# Patient Record
Sex: Male | Born: 1970
Health system: Southern US, Community
[De-identification: ages and names within clinical notes are randomized; demographics above are authoritative.]

## PROBLEM LIST (undated history)

## (undated) DIAGNOSIS — E785 Hyperlipidemia, unspecified: Secondary | ICD-10-CM

## (undated) DIAGNOSIS — J309 Allergic rhinitis, unspecified: Secondary | ICD-10-CM

## (undated) DIAGNOSIS — G4730 Sleep apnea, unspecified: Principal | ICD-10-CM

## (undated) DIAGNOSIS — N498 Inflammatory disorders of other specified male genital organs: Secondary | ICD-10-CM

## (undated) DIAGNOSIS — M79609 Pain in unspecified limb: Secondary | ICD-10-CM

## (undated) DIAGNOSIS — M519 Unspecified thoracic, thoracolumbar and lumbosacral intervertebral disc disorder: Secondary | ICD-10-CM

## (undated) DIAGNOSIS — E119 Type 2 diabetes mellitus without complications: Secondary | ICD-10-CM

## (undated) DIAGNOSIS — G4710 Hypersomnia, unspecified: Secondary | ICD-10-CM

## (undated) HISTORY — DX: Sleep apnea, unspecified: G47.30

## (undated) HISTORY — DX: Hypersomnia, unspecified: G47.10

## (undated) HISTORY — DX: Allergic rhinitis, unspecified: J30.9

## (undated) HISTORY — DX: Inflammatory disorders of other specified male genital organs: N49.8

## (undated) HISTORY — DX: Unspecified thoracic, thoracolumbar and lumbosacral intervertebral disc disorder: M51.9

## (undated) HISTORY — DX: Hyperlipidemia, unspecified: E78.5

## (undated) HISTORY — DX: Type 2 diabetes mellitus without complications: E11.9

## (undated) HISTORY — DX: Pain in unspecified limb: M79.609

---

## 1999-04-13 ENCOUNTER — Encounter: Payer: Self-pay | Admitting: Neurology

## 1999-04-13 ENCOUNTER — Emergency Department (HOSPITAL_COMMUNITY): Admission: EM | Admit: 1999-04-13 | Discharge: 1999-04-13 | Payer: Self-pay | Admitting: Cardiology

## 2000-11-15 ENCOUNTER — Encounter: Admission: RE | Admit: 2000-11-15 | Discharge: 2000-11-15 | Payer: Self-pay | Admitting: Endocrinology

## 2000-11-15 ENCOUNTER — Encounter: Payer: Self-pay | Admitting: Endocrinology

## 2006-07-14 ENCOUNTER — Emergency Department (HOSPITAL_COMMUNITY): Admission: EM | Admit: 2006-07-14 | Discharge: 2006-07-14 | Payer: Self-pay | Admitting: Emergency Medicine

## 2008-07-20 ENCOUNTER — Emergency Department (HOSPITAL_COMMUNITY): Admission: EM | Admit: 2008-07-20 | Discharge: 2008-07-20 | Payer: Self-pay | Admitting: Emergency Medicine

## 2008-11-11 ENCOUNTER — Emergency Department (HOSPITAL_COMMUNITY): Admission: EM | Admit: 2008-11-11 | Discharge: 2008-11-11 | Payer: Self-pay | Admitting: Emergency Medicine

## 2008-11-30 ENCOUNTER — Ambulatory Visit: Payer: Self-pay | Admitting: Internal Medicine

## 2008-11-30 DIAGNOSIS — N498 Inflammatory disorders of other specified male genital organs: Secondary | ICD-10-CM

## 2008-11-30 DIAGNOSIS — G4710 Hypersomnia, unspecified: Secondary | ICD-10-CM

## 2008-11-30 HISTORY — DX: Inflammatory disorders of other specified male genital organs: N49.8

## 2008-11-30 HISTORY — DX: Hypersomnia, unspecified: G47.10

## 2008-11-30 LAB — CONVERTED CEMR LAB
ALT: 42 units/L (ref 0–53)
AST: 29 units/L (ref 0–37)
Albumin: 4.2 g/dL (ref 3.5–5.2)
Alkaline Phosphatase: 60 units/L (ref 39–117)
BUN: 15 mg/dL (ref 6–23)
Basophils Absolute: 0 10*3/uL (ref 0.0–0.1)
Basophils Relative: 0 % (ref 0.0–3.0)
Bilirubin Urine: NEGATIVE
Bilirubin, Direct: 0.2 mg/dL (ref 0.0–0.3)
CO2: 30 meq/L (ref 19–32)
Calcium: 9.4 mg/dL (ref 8.4–10.5)
Chloride: 102 meq/L (ref 96–112)
Cholesterol: 172 mg/dL (ref 0–200)
Creatinine, Ser: 1.3 mg/dL (ref 0.4–1.5)
Direct LDL: 94.9 mg/dL
Eosinophils Absolute: 0.3 10*3/uL (ref 0.0–0.7)
Eosinophils Relative: 2.6 % (ref 0.0–5.0)
GFR calc non Af Amer: 65.52 mL/min (ref >60)
Glucose, Bld: 110 mg/dL — ABNORMAL HIGH (ref 70–99)
HCT: 46.3 % (ref 39.0–52.0)
HDL: 36.9 mg/dL — ABNORMAL LOW (ref >39.00)
Hemoglobin: 16.3 g/dL (ref 13.0–17.0)
Ketones, ur: NEGATIVE mg/dL
Leukocytes, UA: NEGATIVE
Lymphocytes Relative: 29 % (ref 12.0–46.0)
Lymphs Abs: 2.9 10*3/uL (ref 0.7–4.0)
MCHC: 35.2 g/dL (ref 30.0–36.0)
MCV: 90.8 fL (ref 78.0–100.0)
Monocytes Absolute: 0.9 10*3/uL (ref 0.1–1.0)
Monocytes Relative: 9.1 % (ref 3.0–12.0)
Neutro Abs: 6 10*3/uL (ref 1.4–7.7)
Neutrophils Relative %: 59.3 % (ref 43.0–77.0)
Nitrite: NEGATIVE
Platelets: 207 10*3/uL (ref 150.0–400.0)
Potassium: 4 meq/L (ref 3.5–5.1)
RBC: 5.1 M/uL (ref 4.22–5.81)
RDW: 13.1 % (ref 11.5–14.6)
Sodium: 141 meq/L (ref 135–145)
Specific Gravity, Urine: >=1.03 (ref 1.000–1.030)
TSH: 2.55 microintl units/mL (ref 0.35–5.50)
Total Bilirubin: 1 mg/dL (ref 0.3–1.2)
Total CHOL/HDL Ratio: 5
Total Protein, Urine: NEGATIVE mg/dL
Total Protein: 6.9 g/dL (ref 6.0–8.3)
Triglycerides: 273 mg/dL — ABNORMAL HIGH (ref 0.0–149.0)
Urine Glucose: NEGATIVE mg/dL
Urobilinogen, UA: 0.2 (ref 0.0–1.0)
VLDL: 54.6 mg/dL — ABNORMAL HIGH (ref 0.0–40.0)
WBC: 10.1 10*3/uL (ref 4.5–10.5)
pH: 5 (ref 5.0–8.0)

## 2008-12-04 ENCOUNTER — Ambulatory Visit (HOSPITAL_COMMUNITY): Admission: RE | Admit: 2008-12-04 | Discharge: 2008-12-04 | Payer: Self-pay | Admitting: Internal Medicine

## 2008-12-31 ENCOUNTER — Ambulatory Visit: Payer: Self-pay | Admitting: Pulmonary Disease

## 2008-12-31 DIAGNOSIS — J309 Allergic rhinitis, unspecified: Secondary | ICD-10-CM

## 2008-12-31 HISTORY — DX: Allergic rhinitis, unspecified: J30.9

## 2009-01-16 ENCOUNTER — Encounter: Payer: Self-pay | Admitting: Pulmonary Disease

## 2009-01-16 ENCOUNTER — Ambulatory Visit (HOSPITAL_BASED_OUTPATIENT_CLINIC_OR_DEPARTMENT_OTHER): Admission: RE | Admit: 2009-01-16 | Discharge: 2009-01-16 | Payer: Self-pay | Admitting: Pulmonary Disease

## 2009-01-18 ENCOUNTER — Ambulatory Visit: Payer: Self-pay | Admitting: Pulmonary Disease

## 2009-01-26 ENCOUNTER — Encounter: Payer: Self-pay | Admitting: Pulmonary Disease

## 2009-02-01 ENCOUNTER — Ambulatory Visit: Payer: Self-pay | Admitting: Pulmonary Disease

## 2009-02-03 DIAGNOSIS — G4730 Sleep apnea, unspecified: Secondary | ICD-10-CM

## 2009-02-03 HISTORY — DX: Sleep apnea, unspecified: G47.30

## 2009-03-08 ENCOUNTER — Ambulatory Visit: Payer: Self-pay | Admitting: Pulmonary Disease

## 2009-04-23 ENCOUNTER — Encounter: Payer: Self-pay | Admitting: Pulmonary Disease

## 2009-06-02 ENCOUNTER — Observation Stay (HOSPITAL_COMMUNITY): Admission: EM | Admit: 2009-06-02 | Discharge: 2009-06-03 | Payer: Self-pay | Admitting: Emergency Medicine

## 2009-06-02 ENCOUNTER — Emergency Department (HOSPITAL_COMMUNITY): Admission: EM | Admit: 2009-06-02 | Discharge: 2009-06-02 | Payer: Self-pay | Admitting: Family Medicine

## 2009-06-02 ENCOUNTER — Encounter: Payer: Self-pay | Admitting: Internal Medicine

## 2009-06-03 ENCOUNTER — Ambulatory Visit: Payer: Self-pay | Admitting: Internal Medicine

## 2009-06-03 DIAGNOSIS — E119 Type 2 diabetes mellitus without complications: Secondary | ICD-10-CM | POA: Insufficient documentation

## 2009-06-03 HISTORY — DX: Type 2 diabetes mellitus without complications: E11.9

## 2009-06-05 ENCOUNTER — Telehealth: Payer: Self-pay | Admitting: Internal Medicine

## 2009-06-05 LAB — CONVERTED CEMR LAB
BUN: 13 mg/dL (ref 6–23)
CO2: 27 meq/L (ref 19–32)
Calcium: 8.6 mg/dL (ref 8.4–10.5)
Chloride: 104 meq/L (ref 96–112)
Cholesterol: 189 mg/dL (ref 0–200)
Creatinine, Ser: 1.1 mg/dL (ref 0.4–1.5)
Direct LDL: 44.5 mg/dL
GFR calc non Af Amer: 79.24 mL/min (ref >60)
Glucose, Bld: 414 mg/dL — ABNORMAL HIGH (ref 70–99)
HDL: 31.5 mg/dL — ABNORMAL LOW (ref >39.00)
Hgb A1c MFr Bld: 9.4 % — ABNORMAL HIGH (ref 4.6–6.5)
Potassium: 4 meq/L (ref 3.5–5.1)
Sodium: 137 meq/L (ref 135–145)
Total CHOL/HDL Ratio: 6
Triglycerides: 1027 mg/dL — ABNORMAL HIGH (ref 0.0–149.0)
VLDL: 205.4 mg/dL — ABNORMAL HIGH (ref 0.0–40.0)

## 2009-06-07 ENCOUNTER — Telehealth: Payer: Self-pay | Admitting: Internal Medicine

## 2009-06-10 ENCOUNTER — Encounter: Payer: Self-pay | Admitting: Pulmonary Disease

## 2009-06-26 ENCOUNTER — Encounter: Admission: RE | Admit: 2009-06-26 | Discharge: 2009-08-08 | Payer: Self-pay | Admitting: Internal Medicine

## 2009-07-03 ENCOUNTER — Encounter: Payer: Self-pay | Admitting: Internal Medicine

## 2009-07-21 ENCOUNTER — Encounter: Payer: Self-pay | Admitting: Pulmonary Disease

## 2009-07-22 ENCOUNTER — Ambulatory Visit: Payer: Self-pay | Admitting: Pulmonary Disease

## 2009-07-22 ENCOUNTER — Telehealth (INDEPENDENT_AMBULATORY_CARE_PROVIDER_SITE_OTHER): Payer: Self-pay | Admitting: *Deleted

## 2009-07-23 ENCOUNTER — Ambulatory Visit: Payer: Self-pay | Admitting: Internal Medicine

## 2009-07-23 LAB — CONVERTED CEMR LAB
BUN: 13 mg/dL (ref 6–23)
CO2: 29 meq/L (ref 19–32)
Calcium: 9.1 mg/dL (ref 8.4–10.5)
Chloride: 108 meq/L (ref 96–112)
Cholesterol: 119 mg/dL (ref 0–200)
Creatinine, Ser: 1 mg/dL (ref 0.4–1.5)
GFR calc non Af Amer: 88.39 mL/min (ref >60)
Glucose, Bld: 92 mg/dL (ref 70–99)
HDL: 46.8 mg/dL (ref >39.00)
Hgb A1c MFr Bld: 7.8 % — ABNORMAL HIGH (ref 4.6–6.5)
LDL Cholesterol: 38 mg/dL (ref 0–99)
Potassium: 3.6 meq/L (ref 3.5–5.1)
Sodium: 141 meq/L (ref 135–145)
Total CHOL/HDL Ratio: 3
Triglycerides: 171 mg/dL — ABNORMAL HIGH (ref 0.0–149.0)
VLDL: 34.2 mg/dL (ref 0.0–40.0)

## 2009-08-08 ENCOUNTER — Encounter: Payer: Self-pay | Admitting: Internal Medicine

## 2009-08-09 ENCOUNTER — Encounter: Payer: Self-pay | Admitting: Internal Medicine

## 2010-01-31 ENCOUNTER — Ambulatory Visit: Payer: Self-pay | Admitting: Internal Medicine

## 2010-03-14 ENCOUNTER — Ambulatory Visit: Payer: Self-pay | Admitting: Internal Medicine

## 2010-05-16 ENCOUNTER — Encounter: Payer: Self-pay | Admitting: Internal Medicine

## 2010-05-16 ENCOUNTER — Telehealth: Payer: Self-pay | Admitting: Internal Medicine

## 2010-05-22 ENCOUNTER — Telehealth: Payer: Self-pay | Admitting: Internal Medicine

## 2010-05-25 HISTORY — PX: HERNIA REPAIR: SHX51

## 2010-06-26 NOTE — Letter (Signed)
Summary: Patient Visit & Meds Summary/[facility]  Patient Visit & Meds Summary/[facility]   Imported By: Sherian Rein 05/23/2010 09:35:14  _____________________________________________________________________  External Attachment:    Type:   Image     Comment:   External Document

## 2010-06-26 NOTE — Progress Notes (Signed)
Summary: cpap/ appt today?   Phone Note Call from Patient   Caller: Patient Call For: alva Summary of Call: pt has not been able to take his cpap in for download (due to work schedule). does he need to see dr Vassie Loll today? he is in [place] today (because of the appt w/ ra). call him asap at [phone] Initial call taken by: Tivis Ringer, CNA,  July 22, 2009 10:34 AM  Follow-up for Phone Call        Pt going to try and get machine to [facility] to have download done prior to appt time. Abigail Miyamoto RN  July 22, 2009 11:03 AM      Appended Document: cpap/ appt today?  Let him know - download reviewed , compliance remains 2-3 hrs/ night, no leak, residual events acceptable, CPAP does it's job when he uses it .   RA     Informed pt of above. jwr

## 2010-06-26 NOTE — Letter (Signed)
Summary: [facility]  [facility]   Imported By: Sherian Rein 07/09/2009 08:24:22  _____________________________________________________________________  External Attachment:    Type:   Image     Comment:   External Document
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
## 2010-06-26 NOTE — Progress Notes (Signed)
Summary: Abd discomfort  Phone Note Call from Patient Call back at Home Phone [phone]   Caller: Patient Summary of Call: pt called stating that he was bending to pick something up and felt a "pop" on his RT side below his rib cage and above his belly button. pt states that he is not having severe pain but is having soreness in that area. pt is concerned because he says during his exam last OV he was asked if he was having any abdominal pain or discomfort. please advise Initial call taken by: Margaret Pyle, CMA,  June 05, 2009 4:14 PM  Follow-up for Phone Call        very likely to be muscle strain as he describes it;  ok to follow for now, but consider OV if it persists, gets worse, or assoc with fever., n/v , blood  or other change in bowel habits Follow-up by: Corwin Levins MD,  June 05, 2009 4:19 PM  Additional Follow-up for Phone Call Additional follow up Details #1::        pt informed of above Additional Follow-up by: Margaret Pyle, CMA,  June 05, 2009 4:23 PM

## 2010-06-26 NOTE — Progress Notes (Signed)
Summary: Rx refill req  Phone Note Refill Request Message from:  Patient on May 22, 2010 8:18 AM  Refills Requested: Medication #1:  JANUVIA 100 MG TABS 1 by mouth once daily   Dosage confirmed as above?Dosage Confirmed   Supply Requested: 6 months  Method Requested: Fax to [facility] Initial call taken by: Margaret Pyle, CMA,  May 22, 2010 8:19 AM    Prescriptions: JANUVIA 100 MG TABS (SITAGLIPTIN PHOSPHATE) 1 by mouth once daily  #90 x 1   Entered by:   Margaret Pyle, CMA   Authorized by:   Corwin Levins MD   Signed by:   Margaret Pyle, CMA on 05/22/2010   Method used:   Electronically to        [facility]* (retail)       [address]       [address] Shipping/mailing       [place], [place]  [address]       Ph: [phone]       Fax: [phone]   RxID:   [id number]

## 2010-06-26 NOTE — Letter (Signed)
[facility]: [facility]   Imported By: Sherian Rein 08/15/2009 11:33:23  _____________________________________________________________________  External Attachment:    Type:   Image     Comment:   External Document
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
## 2010-06-26 NOTE — Letter (Signed)
Summary: thirsty w freq urination/[facility]  thirsty w freq urination/[facility]   Imported By: Lester [place] 06/06/2009 12:01:29  _____________________________________________________________________  External Attachment:    Type:   Image     Comment:   External Document

## 2010-06-26 NOTE — Letter (Signed)
Summary: [facility]   Imported By: Sherian Rein 10/17/2009 08:21:30  _____________________________________________________________________  External Attachment:    Type:   Image     Comment:   External Document

## 2010-06-26 NOTE — Progress Notes (Signed)
Summary: Blood Sugar readings  Phone Note Call from Patient   Summary of Call: pt called stating on the 10th his BS-295- 11th [phone] [phone]- 13th 288,300 and today so far 14th 335, 370. For dinner on the 13th pt stated he had Tomato Soup and a grilled [place], ham, and cheese on white wheat bread. And for a snack he had 1/2 bowl of chicken and dumplings. When he woke up it was 335. Initial call taken by: Josph Macho CMA,  June 07, 2009 3:18 PM  Follow-up for Phone Call        ok to increase the actos to 45mg  per day, and the metformin to 2 pills twice per day - both sent escrpt Follow-up by: Corwin Levins MD,  June 07, 2009 5:46 PM  Additional Follow-up for Phone Call Additional follow up Details #1::        pt informed Additional Follow-up by: Margaret Pyle, CMA,  June 10, 2009 8:22 AM    New/Updated Medications: GLUCOPHAGE XR 500 MG XR24H-TAB (METFORMIN HCL) 2 by mouth two times a day ACTOS 45 MG TABS (PIOGLITAZONE HCL) 1 by mouth once daily Prescriptions: GLUCOPHAGE XR 500 MG XR24H-TAB (METFORMIN HCL) 2 by mouth two times a day  #360 x 3   Entered and Authorized by:   Corwin Levins MD   Signed by:   Corwin Levins MD on 06/07/2009   Method used:   [facility]ronically to        [facility]  [address] [phone]* (retail)       [address]       [place], [place]  [address]       Ph: [phone]       Fax: [phone]   RxID:   [id number] ACTOS 45 MG TABS (PIOGLITAZONE HCL) 1 by mouth once daily  #90 x 3   Entered and Authorized by:   Corwin Levins MD   Signed by:   Corwin Levins MD on 06/07/2009   Method used:   [facility]        [facility]  [address] [phone][facility] (retail)       [address]       [place], [place]  [address]       Ph: [phone]       Fax: [phone]   RxID:   [id number]

## 2010-06-26 NOTE — Letter (Signed)
Summary: [facility] Results Follow Up Letter  [facility]  [address]   [place], [place] [address]   Phone: [phone]  Fax: [phone]    06/10/2009 MRN: [id number]  Lankford Petrovic [address] [place], [place]  [address]  Dear Mr. Fessel,  We have received the results from your recent tests and have been unable to contact you.  Please call our office at [phone] so that [facility] or his nurse may review the results with you.    Thank you,  [facility]  Appended Document: [place] [facility] Results Follow Up Letter see append from 03/08/2009. jwr
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
## 2010-06-26 NOTE — Letter (Signed)
Summary: MedLink  MedLink   Imported By: Sherian Rein 05/23/2010 09:35:53  _____________________________________________________________________  External Attachment:    Type:   Image     Comment:   External Document

## 2010-06-26 NOTE — Progress Notes (Signed)
Summary: Med link RN  Phone Note Other Incoming   Caller: Med Crisoforo Oxford RN [phone] Eileen Stanford Summary of Call: RN called to inform MD that she had first visit with pt today and he qualifies for a free glucometer, strips, lancet and alcohol wipes. RN is requesting on pt's behalf RXs to [facility]. Initial call taken by: Margaret Pyle, CMA,  May 16, 2010 12:59 PM  Follow-up for Phone Call        ok - to robin to handle Follow-up by: Corwin Levins MD,  May 16, 2010 1:05 PM    New/Updated Medications: [facility] BASIC SYSTEM W/DEVICE KIT (BLOOD GLUCOSE MONITORING SUPPL) use as directed ONETOUCH LANCETS  MISC (LANCETS) use as directed ONETOUCH TEST  STRP (GLUCOSE BLOOD) use as directed BD SWAB SINGLE USE REGULAR  PADS (ALCOHOL SWABS) use as directed Prescriptions: BD SWAB SINGLE USE REGULAR  PADS (ALCOHOL SWABS) use as directed  #100 x 11   Entered by:   Zella Ball Ewing CMA (AAMA)   Authorized by:   Corwin Levins MD   Signed by:   Scharlene Gloss CMA (AAMA) on 05/16/2010   Method used:   Faxed to ...       [facility]* (retail)       [address]       [address] Shipping/mailing       [place], [place]  [address]       Ph: [phone]       Fax: [phone]   RxID:   [id number] [facility] TEST  STRP (GLUCOSE BLOOD) use as directed  #100 x 11   Entered by:   Scharlene Gloss CMA (AAMA)   Authorized by:   Corwin Levins MD   Signed by:   Scharlene Gloss CMA (AAMA) on 05/16/2010   Method used:   Faxed to ...       [facility]* (retail)       [address]       [address] Shipping/mailing       [place], [place]  [address]       Ph: [phone]       Fax: [phone]   RxID:   [id number] [facility] LANCETS  MISC (LANCETS) use as directed  #100 x 11   Entered by:   Zella Ball Ewing CMA (AAMA)   Authorized by:   Corwin Levins MD   Signed by:   Scharlene Gloss CMA (AAMA) on 05/16/2010   Method used:   Faxed to ...       [facility]* (retail)       [address]       [address] [place] [address]       [place], [place]  [address]       Ph: [phone]       Fax: [phone]   RxID:   [id number] [facility] BASIC SYSTEM W/DEVICE KIT (BLOOD GLUCOSE MONITORING SUPPL) use as directed  #1 x 0   Entered by:   Scharlene Gloss CMA (AAMA)   Authorized by:   Corwin Levins MD   Signed by:   Scharlene Gloss CMA (AAMA) on 05/16/2010   Method used:   Faxed to ...       [facility]* (retail)       [address]       [address] Shipping/mailing       [place],  [place]  [address]       Ph: [phone]       Fax: [phone]   RxID:   [id number]

## 2010-08-01 ENCOUNTER — Other Ambulatory Visit: Payer: Self-pay | Admitting: Internal Medicine

## 2010-08-01 ENCOUNTER — Other Ambulatory Visit: Payer: Self-pay

## 2010-08-01 ENCOUNTER — Encounter (INDEPENDENT_AMBULATORY_CARE_PROVIDER_SITE_OTHER): Payer: Self-pay | Admitting: *Deleted

## 2010-08-01 DIAGNOSIS — E785 Hyperlipidemia, unspecified: Secondary | ICD-10-CM

## 2010-08-01 DIAGNOSIS — E119 Type 2 diabetes mellitus without complications: Secondary | ICD-10-CM

## 2010-08-01 LAB — BASIC METABOLIC PANEL
Chloride: 104 mEq/L (ref 96–112)
Potassium: 4.6 mEq/L (ref 3.5–5.1)
Sodium: 140 mEq/L (ref 135–145)

## 2010-08-01 LAB — LIPID PANEL
Cholesterol: 139 mg/dL (ref 0–200)
Total CHOL/HDL Ratio: 4
Triglycerides: 204 mg/dL — ABNORMAL HIGH (ref 0.0–149.0)
VLDL: 40.8 mg/dL — ABNORMAL HIGH (ref 0.0–40.0)

## 2010-08-08 ENCOUNTER — Ambulatory Visit (INDEPENDENT_AMBULATORY_CARE_PROVIDER_SITE_OTHER): Payer: Commercial Managed Care - PPO | Admitting: Internal Medicine

## 2010-08-08 ENCOUNTER — Encounter: Payer: Self-pay | Admitting: Internal Medicine

## 2010-08-08 DIAGNOSIS — M79609 Pain in unspecified limb: Secondary | ICD-10-CM | POA: Insufficient documentation

## 2010-08-08 DIAGNOSIS — E119 Type 2 diabetes mellitus without complications: Secondary | ICD-10-CM

## 2010-08-08 DIAGNOSIS — J309 Allergic rhinitis, unspecified: Secondary | ICD-10-CM

## 2010-08-08 HISTORY — DX: Pain in unspecified limb: M79.609

## 2010-08-10 LAB — URINALYSIS, ROUTINE W REFLEX MICROSCOPIC
Bilirubin Urine: NEGATIVE
Glucose, UA: >1000 mg/dL — AB
Hgb urine dipstick: NEGATIVE
Ketones, ur: 15 mg/dL — AB
Leukocytes, UA: NEGATIVE
Nitrite: NEGATIVE
Protein, ur: NEGATIVE mg/dL
Specific Gravity, Urine: 1.041 — ABNORMAL HIGH (ref 1.005–1.030)
Urobilinogen, UA: 0.2 mg/dL (ref 0.0–1.0)
pH: 5.5 (ref 5.0–8.0)

## 2010-08-10 LAB — GLUCOSE, CAPILLARY
Glucose-Capillary: 191 mg/dL — ABNORMAL HIGH (ref 70–99)
Glucose-Capillary: 242 mg/dL — ABNORMAL HIGH (ref 70–99)
Glucose-Capillary: 269 mg/dL — ABNORMAL HIGH (ref 70–99)
Glucose-Capillary: 274 mg/dL — ABNORMAL HIGH (ref 70–99)
Glucose-Capillary: 290 mg/dL — ABNORMAL HIGH (ref 70–99)
Glucose-Capillary: 337 mg/dL — ABNORMAL HIGH (ref 70–99)
Glucose-Capillary: 378 mg/dL — ABNORMAL HIGH (ref 70–99)
Glucose-Capillary: 452 mg/dL — ABNORMAL HIGH (ref 70–99)
Glucose-Capillary: 497 mg/dL — ABNORMAL HIGH (ref 70–99)
Glucose-Capillary: 578 mg/dL (ref 70–99)

## 2010-08-10 LAB — BASIC METABOLIC PANEL
Calcium: 10 mg/dL (ref 8.4–10.5)
GFR calc Af Amer: >60 mL/min (ref >60)
GFR calc non Af Amer: >60 mL/min (ref >60)
Glucose, Bld: 600 mg/dL (ref 70–99)
Potassium: 4.7 mEq/L (ref 3.5–5.1)
Sodium: 129 mEq/L — ABNORMAL LOW (ref 135–145)

## 2010-08-10 LAB — BASIC METABOLIC PANEL WITH GFR
BUN: 17 mg/dL (ref 6–23)
CO2: 23 meq/L (ref 19–32)
Chloride: 92 meq/L — ABNORMAL LOW (ref 96–112)
Creatinine, Ser: 1.27 mg/dL (ref 0.4–1.5)

## 2010-08-10 LAB — POCT I-STAT, CHEM 8
BUN: 20 mg/dL (ref 6–23)
Calcium, Ion: 1.16 mmol/L (ref 1.12–1.32)
Chloride: 99 mEq/L (ref 96–112)
Creatinine, Ser: 0.8 mg/dL (ref 0.4–1.5)
Sodium: 132 mEq/L — ABNORMAL LOW (ref 135–145)
TCO2: 29 mmol/L (ref 0–100)

## 2010-08-10 LAB — POCT URINALYSIS DIP (DEVICE)
Bilirubin Urine: NEGATIVE
Protein, ur: NEGATIVE mg/dL
Specific Gravity, Urine: <=1.005 (ref 1.005–1.030)
pH: 5.5 (ref 5.0–8.0)

## 2010-08-10 LAB — URINE MICROSCOPIC-ADD ON

## 2010-08-10 LAB — KETONES, QUALITATIVE: Acetone, Bld: NEGATIVE
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
## 2010-09-25 ENCOUNTER — Inpatient Hospital Stay (INDEPENDENT_AMBULATORY_CARE_PROVIDER_SITE_OTHER)
Admission: RE | Admit: 2010-09-25 | Discharge: 2010-09-25 | Disposition: A | Discharge status: Home / Self Care | Payer: 59 | Source: Ambulatory Visit | Attending: Family Medicine | Admitting: Family Medicine

## 2010-09-25 DIAGNOSIS — J029 Acute pharyngitis, unspecified: Secondary | ICD-10-CM

## 2010-10-07 NOTE — Procedures (Signed)
NAMESANDER, REMEDIOS                 ACCOUNT NO.:  [id number]   MEDICAL RECORD NO.:  [id number]          PATIENT TYPE:  OUT   LOCATION:  SLEEP CENTER                 FACILITY:  [facility]   PHYSICIAN:  Oretha Milch, MD      DATE OF BIRTH:  06-06-1970   DATE OF STUDY:  01/16/2009                            NOCTURNAL POLYSOMNOGRAM   REFERRING PHYSICIAN:   INDICATION FOR STUDY:  Excessive daytime sleepiness, loud snoring and  nonrefreshing sleep in this 40 year old gentleman.  At the time of the  study, he weighed 260 pounds with a height of 5 feet 11 inches and a BMI  of 36.  Neck size 17.5 inches.   EPWORTH SLEEPINESS SCORE:  Epworth sleepiness score was 11.   This intervention polysomnogram was performed with a sleep technologist  in attendance.  EEG, EOG, EMG, EKG, and respiratory parameters were  recorded.  Sleep stages, arousals, limb movements, and respiratory data  were scored according to criteria laid out by the American Academy of  Sleep Medicine.   MEDICATIONS:   SLEEP ARCHITECTURE:  Lights out was at 9:45 p.m., lights on was at 5:00  a.m.  CPAP was initiated at 12:42 a.m.  During the diagnostic portion,  total sleep time was 134.5 minutes with a sleep period time of 148  minutes and a sleep maintenance efficiency of 84%.  Sleep latency was 29  minutes and latency to REM sleep was 121 minutes.  Sleep stages as a  percentage of total sleep time was N1 4%, N2 83%, REM sleep 13% (18  minutes), supine sleep accounted for 15.5 minutes.  During the titration  portion, REM was seen in too large periods for 88.5 minutes.  No supine  REM sleep was noted.   RESPIRATORY DATA:  During the diagnostic portion, there were a total of  6 obstructive apnea, 0 central apnea, 0 mixed apneas and 102 hypopneas  with an apnea/hypopnea index of 48 events per hour.  The REM AHI was 67  events per hour, 75 respiratory effort-related arousals (RERAs) were  noted with an RDI of 81.6 events per hour.   Due to this degree of  respiratory disturbance, CPAP was initiated at 5 cm with a medium full-  face mask and titrated to a final level of 11 cm.  At a level of 8 cm  for 89.5 minutes of sleep including 41.5 minutes of REM sleep, 14  obstructive apneas, 3 central apneas and 3 hypopneas were noted with an  AHI of 13.4 and the lowest desaturation of 87%.  At a final level of 11  cm for 110 minutes including 47 minutes of REM sleep, 1 obstructive  apnea and 4 hypopneas were noted with an AHI of 2.7 events per hour and  a low desaturation of 90%.  This appears to be the optimal level used  during the study.   AROUSAL DATA:  During the diagnostic portion, the arousal index was 78  events per hour.  At the final level of CPAP, 51 arousals were noted  with an arousal index of about 27 events per hour.   OXYGEN DATA:  The  lowest desaturation during the diagnostic portion was  80%.  With the CPAP of 11 cm, the lowest desaturation was 90%.   CARDIAC DATA:  During the diagnostic portion, the low heart rate was 52  beats per minute, no arrhythmias were noted.   MOVEMENT-PARASOMNIA:  During the diagnostic portion, the PLM index was  8.5 events per hour.  The PML arousal index was 1.3 events per hour.  PLM seemed to worsen after CPAP initiation with a PML arousal index of  16 events per hour.  This needs to be clinically correlated on CPAP  therapy.   DISCUSSION:  Medium Mirage Quattro mask was used.  No supine REM sleep  was noted.  CPAP of 11 cm seems to be adequate to control the events.  REM rebound was noted in the second half of the night.   IMPRESSIONS-RECOMMENDATIONS:  1. Severe obstructive sleep apnea with predominant hypopneas causing      sleep fragmentation and oxygen desaturation.  2. This was corrected by a CPAP of 11 cm with a medium Mirage full-      face mask.  3. Periodic limb movements seem to worsen after CPAP initiation and      quite a few of these were associated with  arousals.  4. No evidence of cardiac arrhythmias or behavioral disturbance during      sleep.   RECOMMENDATIONS:  1. CPAP should be initiated with a goal of 11 cm with a medium full-      face mask.  2. He should be monitored for the compliance at this level, weight      loss should be encouraged.  He should be asked to avoid medications      with sedative side effects.  He should be cautioned against driving      when sleepy.  3. Please correlate clinically for limb movements with CPAP therapy.      Oretha Milch, MD  Electronically Signed     RVA/MEDQ  D:  01/18/2009 12:08:25  T:  01/18/2009 14:29:03  Job:  [id number]

## 2010-10-10 NOTE — H&P (Signed)
[place]  Patient:    [name]                         MRN: [id number] Adm. Date:  [id number] Attending:  Glean Hess D                         History and Physical  CHIEF COMPLAINT:  Left facial weakness.  HISTORY OF PRESENT ILLNESS:  The patient is a 40 year old man who refers two weeks ago he felt like something popped in the side of his neck.  This was followed by a constant daily dulltype headache localized over the frontoparietal occipital area, more on the left than the right.  He did have some nausea with it, no photophobia, no vomiting.  Yesterday afternoon he noticed facial weakness on the left side of his face.  This morning he noticed some heaviness of the left upper extremity and shoulder, as well.  He mentioned that they do not feel as usual.  He denies double vision.  No ataxia.  No vertigo.  No fever.  No trauma.  PAST MEDICAL HISTORY:  Unremarkable.  No hypertension.  No diabetes.  No coronary disease.  MEDICATIONS:  None.  ALLERGIES:  PENICILLIN.  PRIMARY CARE PHYSICIAN:  Dr. Dagoberto Ligas.  HABITS:  He denies smoking or drinking.  FAMILY HISTORY:  Noncontributory.  SOCIAL HISTORY:  He is married.  He has one child.  His occupation is a Location manager.  PHYSICAL EXAMINATION:  VITAL SIGNS:  Blood pressure 138/81, pulse 84, respirations 20, temperature 97.4.  GENERAL:  Well-developed man laying on the stretcher, in no distress.  HEENT:  The head is normocephalic, atraumatic.  Eyes, ears, nose and throat normal.  NECK:  Supple.  No lymphadenopathy.  LUNGS:  Clear bilaterally.  HEART:  Regular, rhythmic.  No murmurs.  ABDOMEN:  Soft.  Bowel sounds present.  No obvious splenomegaly.  EXTREMITIES:  Positive pulses bilaterally.  No cyanosis or edema.  NEUROLOGIC:  Awake, alert and oriented x 4.  Speech fluent.  Memory and language normal.  Cranial nerves II-XII:  Pupils equal, round, and  reactive. Extraocular cephalic movement present.  Face is asymmetric without left facial palsy involving the upper and lower face.  Tongue in the midline.  Palate elevates symmetrically. Motor examination:  Strength equal bilaterally.  Left upper extremity full strength.  There is some decrease in fine motor control, but he is right handed. DTRs +2 throughout.  Plantars downgoing.  Coordination and sensory normal.  LABORATORY DATA:  CT scan of the head shows no acute abnormalities.  IMPRESSION:  1. Left facial palsy.  2. Subacute headaches.  3. Subjective left upper extremity weakness.  PLAN AND RECOMMENDATIONS:  The diagnosis, condition, and further intervention was discussed at length with the patient.  Would like to keep the patient for a 23-hour observation status.  Would like to obtain an MRI of his brain, with contrast, and MRA of the intra- and extracranial vessels to exclude the possibility of a dissection in view of his history of neck pain, headache, and recent neurological changes.  Will further obtain general lab testing, hemogram, chemistries, urinalysis, chest x-ray, sed rate, RPR, HIV, TSH, B12 and folate.  Dr. Leslie Dales covering for Dr. Dagoberto Ligas was notified of the patients admission.  Thank you for letting me participate in the care of this patient. DD:  04/13/99 TD:  04/13/99 Job: [id number]

## 2011-02-03 DIAGNOSIS — G4730 Sleep apnea, unspecified: Secondary | ICD-10-CM

## 2011-02-07 ENCOUNTER — Other Ambulatory Visit: Payer: Self-pay | Admitting: Internal Medicine

## 2011-02-07 DIAGNOSIS — Z0000 Encounter for general adult medical examination without abnormal findings: Secondary | ICD-10-CM

## 2011-02-07 DIAGNOSIS — Z1289 Encounter for screening for malignant neoplasm of other sites: Secondary | ICD-10-CM

## 2011-02-16 ENCOUNTER — Other Ambulatory Visit: Payer: Commercial Managed Care - PPO

## 2011-02-16 ENCOUNTER — Other Ambulatory Visit: Payer: Self-pay | Admitting: Internal Medicine

## 2011-02-16 ENCOUNTER — Other Ambulatory Visit (INDEPENDENT_AMBULATORY_CARE_PROVIDER_SITE_OTHER): Payer: 59

## 2011-02-16 DIAGNOSIS — Z1289 Encounter for screening for malignant neoplasm of other sites: Secondary | ICD-10-CM

## 2011-02-16 DIAGNOSIS — Z0000 Encounter for general adult medical examination without abnormal findings: Secondary | ICD-10-CM

## 2011-02-16 DIAGNOSIS — IMO0001 Reserved for inherently not codable concepts without codable children: Secondary | ICD-10-CM

## 2011-02-17 LAB — LIPID PANEL
Cholesterol: 143 mg/dL (ref 0–200)
HDL: 36.8 mg/dL — ABNORMAL LOW (ref >39.00)
Total CHOL/HDL Ratio: 4
Triglycerides: 337 mg/dL — ABNORMAL HIGH (ref 0.0–149.0)
VLDL: 67.4 mg/dL — ABNORMAL HIGH (ref 0.0–40.0)

## 2011-02-17 LAB — URINALYSIS, ROUTINE W REFLEX MICROSCOPIC
Bilirubin Urine: NEGATIVE
Hgb urine dipstick: NEGATIVE
Ketones, ur: NEGATIVE
Leukocytes, UA: NEGATIVE
Nitrite: NEGATIVE
Specific Gravity, Urine: >=1.03 (ref 1.000–1.030)
Total Protein, Urine: NEGATIVE
Urine Glucose: NEGATIVE
Urobilinogen, UA: 0.2 (ref 0.0–1.0)
pH: 6 (ref 5.0–8.0)

## 2011-02-17 LAB — CBC WITH DIFFERENTIAL/PLATELET
Basophils Absolute: 0.2 10*3/uL — ABNORMAL HIGH (ref 0.0–0.1)
Basophils Relative: 1.6 % (ref 0.0–3.0)
Eosinophils Absolute: 0.4 10*3/uL (ref 0.0–0.7)
Eosinophils Relative: 3.6 % (ref 0.0–5.0)
HCT: 47 % (ref 39.0–52.0)
Hemoglobin: 16 g/dL (ref 13.0–17.0)
Lymphocytes Relative: 24.2 % (ref 12.0–46.0)
Lymphs Abs: 2.5 10*3/uL (ref 0.7–4.0)
MCHC: 33.9 g/dL (ref 30.0–36.0)
MCV: 91.3 fl (ref 78.0–100.0)
Monocytes Absolute: 0.6 10*3/uL (ref 0.1–1.0)
Monocytes Relative: 5.7 % (ref 3.0–12.0)
Neutro Abs: 6.6 10*3/uL (ref 1.4–7.7)
Neutrophils Relative %: 64.9 % (ref 43.0–77.0)
Platelets: 205 10*3/uL (ref 150.0–400.0)
RBC: 5.15 Mil/uL (ref 4.22–5.81)
RDW: 13.6 % (ref 11.5–14.6)
WBC: 10.2 10*3/uL (ref 4.5–10.5)

## 2011-02-17 LAB — HEMOGLOBIN A1C: Hgb A1c MFr Bld: 6.4 % (ref 4.6–6.5)

## 2011-02-17 LAB — COMPREHENSIVE METABOLIC PANEL
BUN: 18 mg/dL (ref 6–23)
CO2: 28 mEq/L (ref 19–32)
Calcium: 9.2 mg/dL (ref 8.4–10.5)
Chloride: 104 mEq/L (ref 96–112)
Creatinine, Ser: 1.4 mg/dL (ref 0.4–1.5)
GFR: 60.46 mL/min (ref >60.00)
Glucose, Bld: 176 mg/dL — ABNORMAL HIGH (ref 70–99)
Total Bilirubin: 0.6 mg/dL (ref 0.3–1.2)

## 2011-02-17 LAB — PSA: PSA: 0.66 ng/mL (ref 0.10–4.00)

## 2011-02-17 LAB — TSH: TSH: 2.38 u[IU]/mL (ref 0.35–5.50)

## 2011-02-22 ENCOUNTER — Encounter: Payer: Self-pay | Admitting: Internal Medicine

## 2011-02-22 DIAGNOSIS — Z0000 Encounter for general adult medical examination without abnormal findings: Secondary | ICD-10-CM | POA: Insufficient documentation

## 2011-02-23 ENCOUNTER — Ambulatory Visit (INDEPENDENT_AMBULATORY_CARE_PROVIDER_SITE_OTHER): Payer: 59 | Admitting: Internal Medicine

## 2011-02-23 ENCOUNTER — Encounter: Payer: Self-pay | Admitting: Internal Medicine

## 2011-02-23 VITALS — BP 104/82 | HR 75 | Temp 97.7°F | Ht 71.0 in | Wt 249.0 lb

## 2011-02-23 DIAGNOSIS — Z23 Encounter for immunization: Secondary | ICD-10-CM

## 2011-02-23 DIAGNOSIS — E119 Type 2 diabetes mellitus without complications: Secondary | ICD-10-CM

## 2011-02-23 DIAGNOSIS — Z0000 Encounter for general adult medical examination without abnormal findings: Secondary | ICD-10-CM

## 2011-02-23 NOTE — Assessment & Plan Note (Signed)

## 2011-02-23 NOTE — Patient Instructions (Addendum)
You had the flu shot today Your EKG was good today Please return in 6 mo with Lab testing done 3-5 days before

## 2011-02-23 NOTE — Progress Notes (Signed)
Subjective:    Patient ID: [name], male    DOB: 1970/09/04, 40 y.o.   MRN: [id number]  HPI Here for wellness and f/u;  Overall doing ok;  Pt denies CP, worsening SOB, DOE, wheezing, orthopnea, PND, worsening LE edema, palpitations, dizziness or syncope.  Pt denies neurological change such as new Headache, facial or extremity weakness.  Pt denies polydipsia, polyuria, or low sugar symptoms. Pt states overall good compliance with treatment and medications, good tolerability, and trying to follow lower cholesterol diet.  Pt denies worsening depressive symptoms, suicidal ideation or panic. No fever, wt loss, night sweats, loss of appetite, or other constitutional symptoms.  Pt states good ability with ADL's, low fall risk, home safety reviewed and adequate, no significant changes in hearing or vision, and occasionally active with exercise. Past Medical History  Diagnosis Date  . ALLERGIC RHINITIS 12/31/2008  . DIABETES MELLITUS, TYPE II 06/03/2009  . FOOT PAIN, RIGHT 08/08/2010  . HYPERSOMNIA 11/30/2008  . OBSTRUCTIVE SLEEP APNEA 02/03/2009  . OTHER INFLAMMATORY DISORDER MALE GENITAL ORGANS 11/30/2008   No past surgical history on file.  reports that he has never smoked. He does not have any smokeless tobacco history on file. He reports that he drinks alcohol. He reports that he does not use illicit drugs. family history includes Cancer in his other; Coronary artery disease in his father; Depression in his maternal grandmother and mother; Diabetes in his father; Heart attack in his father; and Hypertension in his father. Allergies  Allergen Reactions  . Penicillins    Current Outpatient Prescriptions on File Prior to Visit  Medication Sig Dispense Refill  . Alcohol Swabs (B-D SINGLE USE SWABS REGULAR) PADS Use as directed       . aspirin 81 MG tablet Take 81 mg by mouth daily.        . Blood Glucose Monitoring Suppl (ONE TOUCH BASIC SYSTEM) W/DEVICE KIT Use as directed       . glucose blood  (FREESTYLE LITE) test strip 1 each by Other route 2 (two) times daily. Use as instructed       . glucose blood (ONE TOUCH TEST STRIPS) test strip Use as directed       . Lancets MISC Use as directed two times daily       . metFORMIN (GLUCOPHAGE-XR) 500 MG 24 hr tablet Take 500 mg by mouth daily.        . NON FORMULARY CPAP + 11 CM, full face mask       . ONE TOUCH LANCETS MISC Use as directed       . sitaGLIPtin (JANUVIA) 100 MG tablet Take 100 mg by mouth daily.         Review of Systems Review of Systems  Constitutional: Negative for diaphoresis, activity change, appetite change and unexpected weight change.  HENT: Negative for hearing loss, ear pain, facial swelling, mouth sores and neck stiffness.   Eyes: Negative for pain, redness and visual disturbance.  Respiratory: Negative for shortness of breath and wheezing.   Cardiovascular: Negative for chest pain and palpitations.  Gastrointestinal: Negative for diarrhea, blood in stool, abdominal distention and rectal pain.  Genitourinary: Negative for hematuria, flank pain and decreased urine volume.  Musculoskeletal: Negative for myalgias and joint swelling.  Skin: Negative for color change and wound.  Neurological: Negative for syncope and numbness.  Hematological: Negative for adenopathy.  Psychiatric/Behavioral: Negative for hallucinations, self-injury, decreased concentration and agitation.     Objective:   Physical Exam BP  104/82  Pulse 75  Temp(Src) 97.7 F (36.5 C) (Oral)  Ht 5\' 11"  (1.803 m)  Wt 249 lb (112.946 kg)  BMI 34.73 kg/m2  SpO2 95% Physical Exam  VS noted Constitutional: Pt is oriented to person, place, and time. Appears well-developed and well-nourished.  HENT:  Head: Normocephalic and atraumatic.  Right Ear: External ear normal.  Left Ear: External ear normal.  Nose: Nose normal.  Mouth/Throat: Oropharynx is clear and moist.  Eyes: Conjunctivae and EOM are normal. Pupils are equal, round, and reactive to  light.  Neck: Normal range of motion. Neck supple. No JVD present. No tracheal deviation present.  Cardiovascular: Normal rate, regular rhythm, normal heart sounds and intact distal pulses.   Pulmonary/Chest: Effort normal and breath sounds normal.  Abdominal: Soft. Bowel sounds are normal. There is no tenderness.  Musculoskeletal: Normal range of motion. Exhibits no edema.  Lymphadenopathy:  Has no cervical adenopathy.  Neurological: Pt is alert and oriented to person, place, and time. Pt has normal reflexes. No cranial nerve deficit.  Skin: Skin is warm and dry. No rash noted.  Psychiatric:  Has  normal mood and affect. Behavior is normal.     Assessment & Plan:

## 2011-02-27 ENCOUNTER — Encounter: Payer: Self-pay | Admitting: Internal Medicine

## 2011-02-27 ENCOUNTER — Observation Stay (HOSPITAL_COMMUNITY)
Admission: EM | Admit: 2011-02-27 | Discharge: 2011-03-01 | Disposition: A | Discharge status: Home / Self Care | Payer: 59 | Attending: General Surgery | Admitting: General Surgery

## 2011-02-27 ENCOUNTER — Emergency Department (HOSPITAL_COMMUNITY): Payer: 59

## 2011-02-27 ENCOUNTER — Ambulatory Visit (INDEPENDENT_AMBULATORY_CARE_PROVIDER_SITE_OTHER): Payer: 59 | Admitting: Internal Medicine

## 2011-02-27 VITALS — BP 100/70 | HR 90 | Temp 98.2°F | Ht 71.0 in | Wt 249.0 lb

## 2011-02-27 DIAGNOSIS — R109 Unspecified abdominal pain: Secondary | ICD-10-CM | POA: Insufficient documentation

## 2011-02-27 DIAGNOSIS — E785 Hyperlipidemia, unspecified: Secondary | ICD-10-CM | POA: Insufficient documentation

## 2011-02-27 DIAGNOSIS — E119 Type 2 diabetes mellitus without complications: Secondary | ICD-10-CM | POA: Insufficient documentation

## 2011-02-27 DIAGNOSIS — K436 Other and unspecified ventral hernia with obstruction, without gangrene: Principal | ICD-10-CM | POA: Insufficient documentation

## 2011-02-27 DIAGNOSIS — G4733 Obstructive sleep apnea (adult) (pediatric): Secondary | ICD-10-CM | POA: Insufficient documentation

## 2011-02-27 DIAGNOSIS — K430 Incisional hernia with obstruction, without gangrene: Secondary | ICD-10-CM

## 2011-02-27 LAB — DIFFERENTIAL
Basophils Absolute: 0 10*3/uL (ref 0.0–0.1)
Basophils Relative: 0 % (ref 0–1)
Eosinophils Absolute: 0.3 10*3/uL (ref 0.0–0.7)
Eosinophils Relative: 3 % (ref 0–5)
Monocytes Absolute: 0.8 10*3/uL (ref 0.1–1.0)
Neutro Abs: 6.1 10*3/uL (ref 1.7–7.7)

## 2011-02-27 LAB — COMPREHENSIVE METABOLIC PANEL
ALT: 33 U/L (ref 0–53)
AST: 23 U/L (ref 0–37)
Albumin: 4 g/dL (ref 3.5–5.2)
Alkaline Phosphatase: 71 U/L (ref 39–117)
Calcium: 9.3 mg/dL (ref 8.4–10.5)
Potassium: 4.1 mEq/L (ref 3.5–5.1)
Sodium: 137 mEq/L (ref 135–145)
Total Protein: 7.3 g/dL (ref 6.0–8.3)

## 2011-02-27 LAB — CBC
Hemoglobin: 16 g/dL (ref 13.0–17.0)
MCHC: 34.9 g/dL (ref 30.0–36.0)
Platelets: 186 10*3/uL (ref 150–400)
RDW: 13.1 % (ref 11.5–15.5)

## 2011-02-27 MED ORDER — IOHEXOL 300 MG/ML  SOLN
100.0000 mL | Freq: Once | INTRAMUSCULAR | Status: AC | PRN
  Administered 2011-02-27: 100 mL via INTRAVENOUS

## 2011-02-27 NOTE — Patient Instructions (Signed)
Please do not ear or drink for now Please go directly to [facility] ER for evaluation by PA for [facility], and ER MD Pt received no other treatment at this facility

## 2011-02-27 NOTE — Progress Notes (Signed)
Subjective:    Patient ID: [name], male    DOB: Feb 10, 1971, 40 y.o.   MRN: [id number]  HPI  Here with c/o 4-5 days onet abd pain , mid abdomen, sharp, mod to severe especialy to palpate, better now, but still at least mod tender, feels a hard knot to the area, abd nondistended, no n/v, no radiation to the back,  Getting up from sitting is slow and painful - feels "tight", has to roll sideways to get up as simply standing up causes too much pain and pressure, no obvious constipation ,  Denies urinary symptoms such as dysuria, frequency, urgency,or hematuria.   Pt denies fever, wt loss, night sweats, loss of appetite, or other constitutional symptoms   No blood of any kind.    Does remember a severe sharp pain previously with picking a heavy box 6 mo ago. Past Medical History  Diagnosis Date  . ALLERGIC RHINITIS 12/31/2008  . DIABETES MELLITUS, TYPE II 06/03/2009  . FOOT PAIN, RIGHT 08/08/2010  . HYPERSOMNIA 11/30/2008  . OBSTRUCTIVE SLEEP APNEA 02/03/2009  . OTHER INFLAMMATORY DISORDER MALE GENITAL ORGANS 11/30/2008   No past surgical history on file.  reports that he has never smoked. He does not have any smokeless tobacco history on file. He reports that he drinks alcohol. He reports that he does not use illicit drugs. family history includes Cancer in his other; Coronary artery disease in his father; Depression in his maternal grandmother and mother; Diabetes in his father; Heart attack in his father; and Hypertension in his father. Allergies  Allergen Reactions  . Penicillins    Current Outpatient Prescriptions on File Prior to Visit  Medication Sig Dispense Refill  . Alcohol Swabs (B-D SINGLE USE SWABS REGULAR) PADS Use as directed       . aspirin 81 MG tablet Take 81 mg by mouth daily.        . Blood Glucose Monitoring Suppl (ONE TOUCH BASIC SYSTEM) W/DEVICE KIT Use as directed       . glucose blood (FREESTYLE LITE) test strip 1 each by Other route 2 (two) times daily. Use as instructed        . glucose blood (ONE TOUCH TEST STRIPS) test strip Use as directed       . Lancets MISC Use as directed two times daily       . metFORMIN (GLUCOPHAGE-XR) 500 MG 24 hr tablet Take 500 mg by mouth daily.        . NON FORMULARY CPAP + 11 CM, full face mask       . ONE TOUCH LANCETS MISC Use as directed       . sitaGLIPtin (JANUVIA) 100 MG tablet Take 100 mg by mouth daily.         Review of Systems Review of Systems  Constitutional: Negative for diaphoresis and unexpected weight change.  HENT: Negative for drooling and tinnitus.   Eyes: Negative for photophobia and visual disturbance.  Respiratory: Negative for choking and stridor.   Gastrointestinal: Negative for vomiting and blood in stool.  Genitourinary: Negative for hematuria and decreased urine volume.     Objective:   Physical Exam BP 100/70  Pulse 90  Temp(Src) 98.2 F (36.8 C) (Oral)  Ht 5\' 11"  (1.803 m)  Wt 249 lb (112.946 kg)  BMI 34.73 kg/m2  SpO2 95% Physical Exam  VS noted Constitutional: Pt appears well-developed and well-nourished.  HENT: Head: Normocephalic.  Right Ear: External ear normal.  Left Ear: External ear  normal.  Eyes: Conjunctivae and EOM are normal. Pupils are equal, round, and reactive to light.  Neck: Normal range of motion. Neck supple.  Cardiovascular: Normal rate and regular rhythm.   Pulmonary/Chest: Effort normal and breath sounds normal.  Abd:  Soft, non-distended, + BS, with supraumbilical firm deeper to palpation "mass" marked tender, nonreducible, without overlying skin change Neurological: Pt is alert. No cranial nerve deficit.  Skin: Skin is warm. No erythema.  Psychiatric: Pt behavior is normal. Thought content normal.     Assessment & Plan:

## 2011-02-27 NOTE — Assessment & Plan Note (Signed)
Mid abd above the umbilicu, with mass I cannot reduce marked tender;  D/w Dr Jon Gills; possible incarcerated  Ventral hernia - pt to go to ER [facility] for PA for [facility] to assess;  No tx or rx done at this visit;  Pt asked to be NPO for now

## 2011-02-28 ENCOUNTER — Inpatient Hospital Stay (HOSPITAL_COMMUNITY): Payer: 59

## 2011-02-28 LAB — GLUCOSE, CAPILLARY
Glucose-Capillary: 127 mg/dL — ABNORMAL HIGH (ref 70–99)
Glucose-Capillary: 142 mg/dL — ABNORMAL HIGH (ref 70–99)

## 2011-02-28 LAB — SURGICAL PCR SCREEN
MRSA, PCR: NEGATIVE
Staphylococcus aureus: POSITIVE — AB

## 2011-03-01 ENCOUNTER — Encounter: Payer: Self-pay | Admitting: Internal Medicine

## 2011-03-01 DIAGNOSIS — M519 Unspecified thoracic, thoracolumbar and lumbosacral intervertebral disc disorder: Secondary | ICD-10-CM

## 2011-03-01 HISTORY — DX: Unspecified thoracic, thoracolumbar and lumbosacral intervertebral disc disorder: M51.9

## 2011-03-01 LAB — GLUCOSE, CAPILLARY: Glucose-Capillary: 135 mg/dL — ABNORMAL HIGH (ref 70–99)

## 2011-03-04 ENCOUNTER — Ambulatory Visit (INDEPENDENT_AMBULATORY_CARE_PROVIDER_SITE_OTHER): Payer: Commercial Managed Care - PPO | Admitting: Surgery

## 2011-03-04 ENCOUNTER — Encounter (INDEPENDENT_AMBULATORY_CARE_PROVIDER_SITE_OTHER): Payer: Self-pay | Admitting: Surgery

## 2011-03-04 VITALS — BP 118/70 | HR 72 | Temp 97.8°F | Resp 14 | Ht 71.0 in | Wt 242.8 lb

## 2011-03-04 DIAGNOSIS — Z09 Encounter for follow-up examination after completed treatment for conditions other than malignant neoplasm: Secondary | ICD-10-CM

## 2011-03-04 NOTE — Progress Notes (Signed)
Subjective:     Patient ID: [name], male   DOB: 1970/11/22, 40 y.o.   MRN: [id number]  HPI He is status post emergent repair of incarcerated ventral hernia by Dr. Darylene Price on October 5. He reports that he started having night sweats last night and had erythema of his abdominal wall. After making an appointment he reports erythema has resolved he does feel much better he denies any drainage. He has been constipated but this is now resolving as well.  Review of Systems     Objective:   Physical Exam On physical exam, he is well appearance. He is afebrile his vitals are stable. His abdomen is soft and his incision is healing well. There is no detectable erythema. I could express no purulence    Assessment:     Patient status post emergent repair of a ventral hernia with mesh with possible postoperative infection    Plan:     I am going to go ahead and start him on Bactrim DS p.o. b.i.d. for 10 days. He will come back and see Dr. Darylene Price next week. He will call should his fevers persist or if erythema redevelops

## 2011-03-06 NOTE — Progress Notes (Signed)
Subjective:     Patient ID: [name], male   DOB: Oct 14, 1970, 40 y.o.   MRN: [id number]  HPI   Review of Systems     Objective:   Physical Exam     Assessment:         Plan:

## 2011-03-06 NOTE — Op Note (Signed)
NAMEJEROMIAH, [name]                 ACCOUNT NO.:  [id number]  MEDICAL RECORD NO.:  [id number]  LOCATION:  1521                         FACILITY:  [facility]  PHYSICIAN:  Lodema Pilot, MD       DATE OF BIRTH:  04-06-1971  DATE OF PROCEDURE:  02/27/2011 DATE OF DISCHARGE:  09/25/2010                              OPERATIVE REPORT   PROCEDURE:  Open repair of incarcerated ventral hernia with mesh.  PREOPERATIVE DIAGNOSIS:  Incarcerated ventral hernia.  POSTOPERATIVE DIAGNOSIS:  Incarcerated ventral hernia.  SURGEON:  Lodema Pilot, MD  ASSISTANT:  None.  ANESTHESIA:  General endotracheal anesthesia with 30 cc of 1% lidocaine with epinephrine, 0.25% Marcaine in a 50:50 mixture.  FLUIDS:  1400 cc of crystalloid.  ESTIMATED BLOOD LOSS:  25 cc.  DRAINS:  None.  SPECIMENS:  None.  COMPLICATIONS:  None apparent.  FINDINGS:  Two separate defects superior to the umbilicus in the midline, 3.5 cm x 2 cm defect with another smaller 1 cm x 1 cm defect just cephalad to this, both containing incarcerated omental or preperitoneal fat.  No evidence of bowel contents.  The defects were connected into a single 3.5 cm x 3.5 cm defect and defect was closed primarily with 4.3 cm x 4.3 cm PVP patch underlay.  INDICATIONS FOR PROCEDURE:  [name] presented today with periumbilical pain and was found to have a supraumbilical ventral hernia on exam, which was unable to be reduced in the emergency room.  He had tenderness in the area and a CT scan which was concerning for incarcerated ventral hernia and desired operative repair.  OPERATIVE DETAILS:  [name] was seen and evaluated  in the emergency room and risks and benefits of the procedure were discussed in lay terms.  Informed consent was obtained and he was taken to the operating room, placed on table in a supine position.  Prophylactic antibiotics were given and endotracheal tube anesthesia was obtained.  His abdomen was prepped and draped  in a standard surgical fashion and a midline incision was made over the palpable hernia and dissection carried down through the subcutaneous tissue using Bovie electrocautery.  The hernia sac was identified and dissected circumferentially down to a normal fascia and unable to reduce this manually.  I opened the hernia sac at its base circumferentially around the fascial edge and after I released these attachments at the edge, the incarcerated fat was able to be reduced back into the abdomen.  The fat appeared healthy and did not appear to be ischemic and reduced then back into the abdominal cavity easily.  Just cephalad to the upper edge of the fascia was another small less and 1 cm fascial defect, but this was only a few millimeters away from the larger 3.5 cm wide by approximately 2 cm defect and these were connected to create a single fascial defect. The fascia was cleared circumferentially and the undersurface of the fascia was cleared using blunt dissection and given the fact that he is overweight and his fascia did not appear very healthy with two separate swiss cheese type defects, and the fat did not appear ischemic or there was no evidence of contamination.  I felt that it is the best to repair this with the mesh repair.  A 4.3 cm x 4.3 cm PVP Prolene mesh was placed in the preperitoneal space just under the fascia and 2-0 Prolene suture was placed at the 12 o'clock and 6 o'clock position and parachuted up through the fascia under direct visualization, taking care to avoid injury to underlying bowel contents.  The mesh appeared to lay flat and did not feel that it needed stitches at the 3 o'clock and 9 o'clock position and as the mesh was closely adherent to the undersurface of the fascia, and appeared to lie flat.  The tails of the mesh were cut and the fascial edges were closed transversely over the Prolene mesh with interrupted 0 Ethibond sutures incorporating small piece of  the mesh and the tails of the mesh and multiple Ethibond sutures were placed and the defect was closed over top of the mesh and the hernia defect appeared to be well approximated and not feel any other nearby fascial defects and the wound was irrigated with sterile saline solution and the subcutaneous tissue was injected with a total 30 cc of 1% lidocaine with epinephrine and 0.25% Marcaine in a 50:50 mixture.  Then the subcutaneous tissue was approximated with a running 2-0 Vicryl suture and the skin edges were approximated with a running 3-0 Monocryl subcuticular suture.  The skin was well approximated.  Skin was washed and dried and Dermabond was applied.  The needle count and the sponge count was corrected at the end of the case; however, the needle count was not correct and it was felt that it was just a miscount initially as the number of suture packages there were opened corresponded to the number of needles that were counted for; however, given the questionable abnormal needle count, abdominal x-ray was obtained prior to awakening the patient and there was no evidence of any metallic foreign bodies. The patient was stable and ready for transfer to recovery room in stable condition.          ______________________________ Lodema Pilot, MD     BL/MEDQ  D:  02/28/2011  T:  02/28/2011  Job:  [id number]  Electronically Signed by Lodema Pilot DO on 03/06/2011 12:14:26 AM

## 2011-03-19 ENCOUNTER — Encounter (INDEPENDENT_AMBULATORY_CARE_PROVIDER_SITE_OTHER): Payer: Commercial Managed Care - PPO | Admitting: General Surgery

## 2011-03-23 NOTE — H&P (Signed)
[name], [name]                 ACCOUNT NO.:  [id number]  MEDICAL RECORD NO.:  [id number]  LOCATION:  [facility]                         FACILITY:  [facility]  PHYSICIAN:  [name], M.D. DATE OF BIRTH:  25-Apr-1971  DATE OF ADMISSION:  02/27/2011 DATE OF DISCHARGE:                             HISTORY & PHYSICAL   REFERRING PHYSICIAN:  Stacie Glaze, MD  ADMISSION COMPLAINT:  Abdominal pain.  BRIEF HISTORY:  The patient is a 40 year old white male who presented to Dr. Oliver [name] today with abdominal pain.  He said it started on Tuesday and by Wednesday, it hurt to move.  He got somewhat better, still had pain in the midportion of his stomach.  It has not improved.  Movement continues to makes it worse.  He subsequently went to Dr. Raphael [name] office and evaluation there.  Abdomen was soft, not distended, had bowel sounds.  There was a firm supraumbilical mass which was tender and nonreducible at that time. Dr. Jonny [name] contacted Dr. Fannie [name] who referred him to the ER at [facility] for [place] to see.  Since leaving Dr. Raphael [name] office, the patient presented to the ER and currently he is still having pain with movement.  He has a palpable ventral hernia, a mass is palpable.  The patient has had no nausea, vomiting, diarrhea, or constipation.  His last meal was around 9:00 a.m.  His last bowel movement was yesterday.  Please note, the patient picked up a box of heavy books about 6 months ago and noted a burning sensation in his abdomen and then got symptoms similar to what he has been experiencing.  On May 30th, it got better, but he had a great deal of pain, then it resolved and now it is back.  PAST MEDICAL HISTORY: 1. Adult-onset diabetes mellitus.  He presented with DKA about 1 year     ago. 2. Allergic rhinitis. 3. Obstructive sleep apnea, currently on CPAP. 4. BMI of 34.7.  Weight is currently 249, height is 71 inches.  He is     down from about 260 pounds last year. 5. History of Bell  palsy. 6. History of dyslipidemia.  PAST SURGICAL HISTORY:  None.  FAMILY HISTORY:  Father is living and has a history of diabetes, coronary artery bypass grafting, AICD, permanent transvenous pacemaker, and bilateral peripheral vascular occlusive disease.  Mother is living, overweight, with diabetes.  One sister in good health.  SOCIAL HISTORY:  Tobacco:  None.  Drugs:  None.  Alcohol:  Social.  He is married and has 2 children, ages 31 and 55; and he is a Tax adviser.  REVIEW OF SYSTEMS:  FEVER:  None.  CEREBROVASCULAR:  No headaches, dizziness, syncope, stroke, or seizures.  SKIN:  No changes.  CARDIAC: No changes.  PULMONARY:  Positive for obstructive sleep apnea, wears a CPAP machine.  No other significant pulmonary issues.  GI:  Symptoms started on Tuesday.  As noted above, he had pain after lifting a heavy box of books several months ago and had a similar episode of pain and discomfort back in May 2012.  GU:  Negative.  LOWER EXTREMITIES: Negative.  He does have  problem with his left [name] from ball injury in the past.  PSYCH:  No changes.  CURRENT MEDICATIONS:  He is on Januvia 1 daily and metformin 1 daily, he does not know the doses.  No other medicines.  ALLERGIES:  PENICILLIN, he does not know the reaction.  This is from his parents' report.  PHYSICAL EXAMINATION:  GENERAL:  This is a well-nourished, well- developed white male in no acute distress. VITAL SIGNS:  Height is 5 feet 11 inches, weight is 249 pounds. Temperature is 97.8, blood pressure is 160/86, heart rate is 86, respiratory rate is 16, sats are 97% on room air. HEAD:  Normocephalic. EARS, NOSE, THROAT, AND MOUTH:  Grossly within normal limits. NECK:  Trachea is in the midline.  Thyroid is not palpable.  There are no bruits.  No JVD. CHEST:  Clear to auscultation and percussion. CARDIAC:  Normal rhythm.  Normal S1 and S2.  No murmurs or rubs.  Pulses are +2 and equal in the upper and lower  extremities. CHEST:  Nontender. ABDOMEN:  Normal bowel sounds.  He is not distended.  He is tender to palpation and movement.  He has a palpable ventral hernia, you can feel something in it.  Right now, it is not hard and it is hard to define. There are no abscesses or masses. GU/RECTAL:  Deferred. LYMPHADENOPATHY:  None palpated. MUSCULOSKELETAL:  Within normal limits. SKIN:  No changes. NEUROLOGIC:  No focal changes.  Cranial nerves intact. PSYCH:  Normal affect.  STUDIES:  Labs and CT are pending.  IMPRESSION: 1. Ventral hernia, rule out incarceration. 2. Adult-onset diabetes mellitus. 3. History of obstructive sleep apnea with continuous positive airway     pressure. 4. Body mass index of 34.7. 5. Dyslipidemia.  PLAN:  The patient is currently having labs drawn and a CT has been ordered with oral contrast to rule out incarcerated ventral hernia.  He is n.p.o. with further workup and evaluation as needed.     [name], P.A.   ______________________________ [name], M.D.    WDJ/MEDQ  D:  02/27/2011  T:  02/27/2011  Job:  [id number]  cc:   Joshua Levins, MD [address] [place] [address]  Electronically Signed by [name] P.A. on 03/13/2011 04:21:32 PM Electronically Signed by [name] M.D. on 03/23/2011 08:11:02 AM

## 2011-04-02 ENCOUNTER — Encounter (INDEPENDENT_AMBULATORY_CARE_PROVIDER_SITE_OTHER): Payer: Self-pay | Admitting: General Surgery

## 2011-04-03 ENCOUNTER — Ambulatory Visit (INDEPENDENT_AMBULATORY_CARE_PROVIDER_SITE_OTHER): Payer: Commercial Managed Care - PPO | Admitting: General Surgery

## 2011-04-03 ENCOUNTER — Encounter (INDEPENDENT_AMBULATORY_CARE_PROVIDER_SITE_OTHER): Payer: Self-pay | Admitting: General Surgery

## 2011-04-03 VITALS — BP 123/88 | HR 81 | Temp 97.6°F | Resp 16 | Ht 71.0 in | Wt 246.2 lb

## 2011-04-03 DIAGNOSIS — Z5189 Encounter for other specified aftercare: Secondary | ICD-10-CM

## 2011-04-03 DIAGNOSIS — Z4889 Encounter for other specified surgical aftercare: Secondary | ICD-10-CM

## 2011-04-03 NOTE — Progress Notes (Signed)
Subjective:     Patient ID: [name], male   DOB: 22-Apr-1971, 40 y.o.   MRN: [id number]  HPI This patient is approximately one month status post open repair of incarcerated ventral hernia with mesh. He has returned to work and is off pain medication. He states that he is tolerating regular diet and his bowels are functioning well and is sugars are well controlled. He has no complaints.  Review of Systems     Objective:   Physical Exam No acute distress and nontoxic-appearing  His abdomen is soft and nontender exam his incision is well healed without signs of infection there is no evidence of recurrent hernia.    Assessment:     Status post open repair of incarcerated ventral hernia-doing well.    Plan:     Tracking seems to be doing well with his procedure. He is back to work and is ready to start physical activities again. He has good cosmesis. I think that it is reasonable to gradually increase activities as tolerated and placed no restrictions on him. He is doing well until followup on a p.r.n. basis

## 2011-04-07 ENCOUNTER — Other Ambulatory Visit: Payer: Self-pay | Admitting: Internal Medicine

## 2011-05-12 IMAGING — US US SCROTUM
1 series · 14 of 25 positions shown · non-contrast
Comparison: None

CLINICAL DATA: Mass above the right testis

ULTRASOUND OF SCROTUM
TECHNIQUE: Complete ultrasound examination of the testicles,
epididymis, and other scrotal structures was performed.

[Series 1: us scrotum · 0.08mm/px · 14 of 50 slices shown]
[im 1/50]
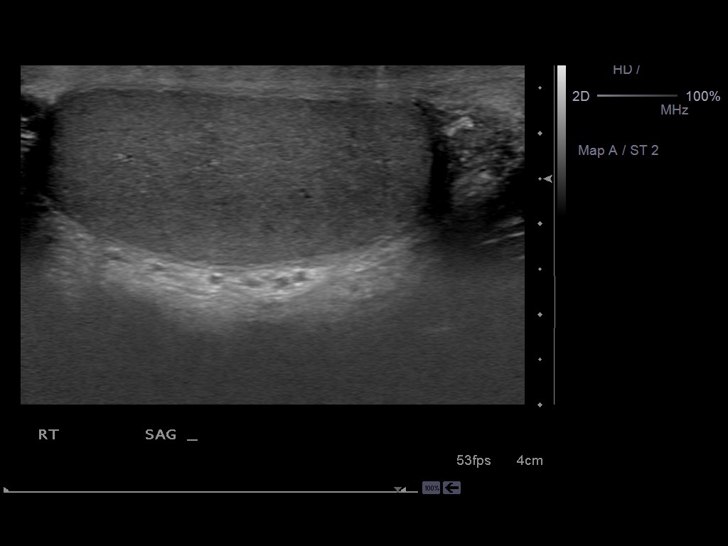
[im 5/50]
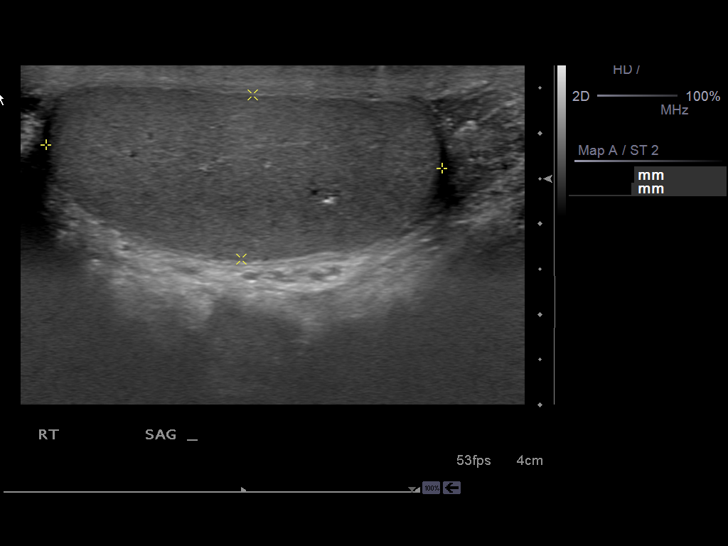
[im 9/50]
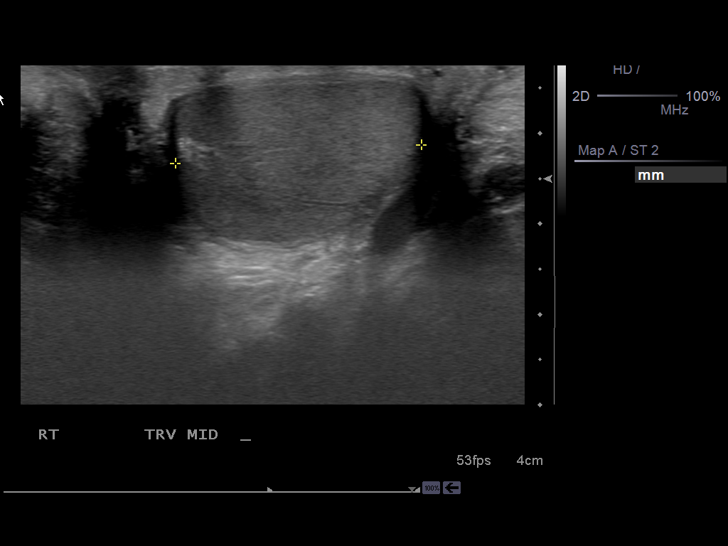
[im 13/50]
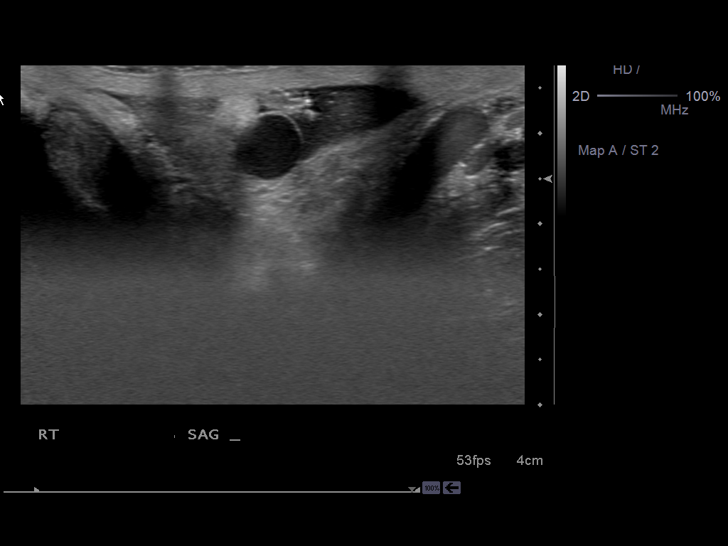
[im 17/50]
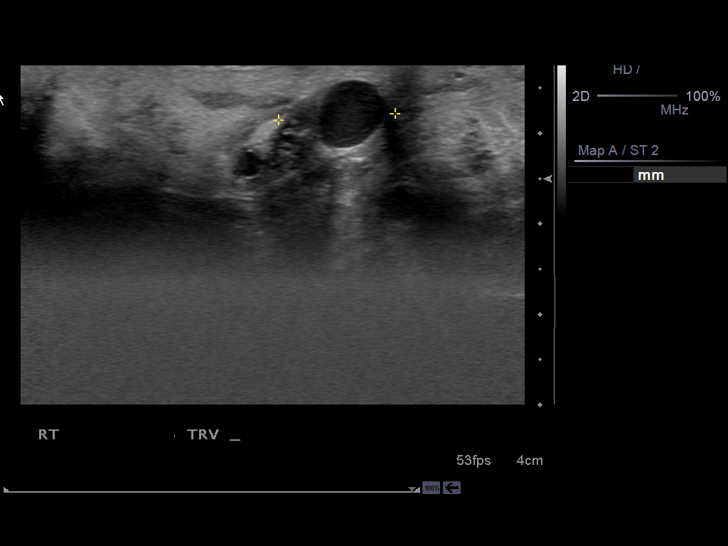
[im 19/50]
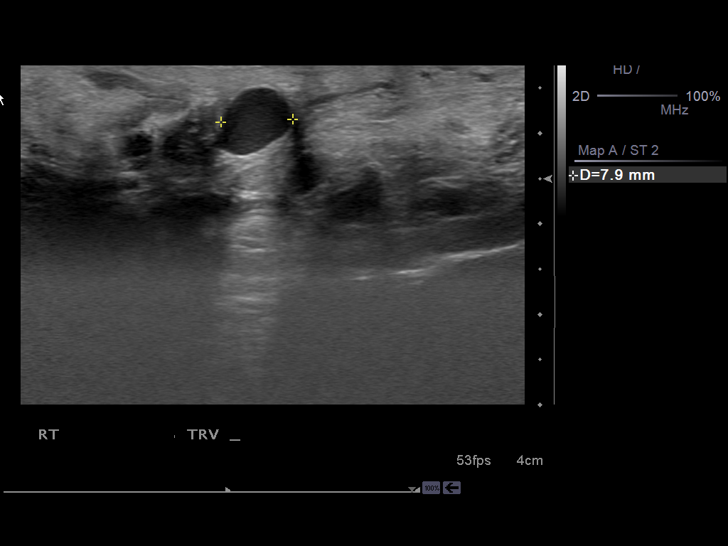
[im 23/50]
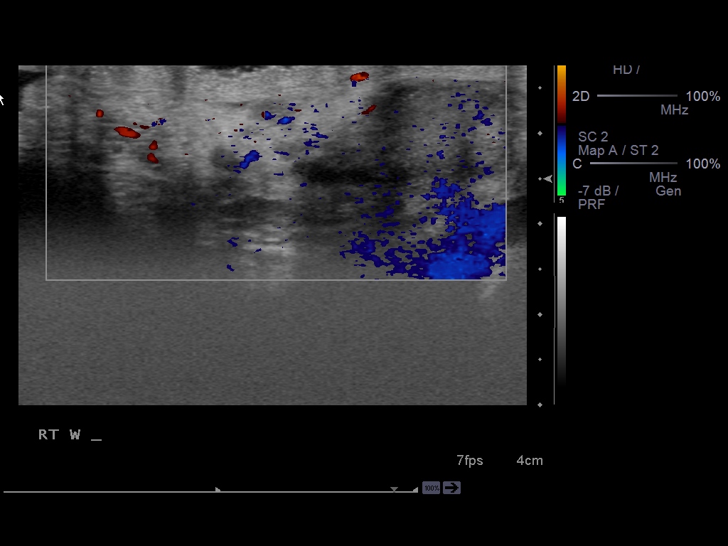
[im 27/50]
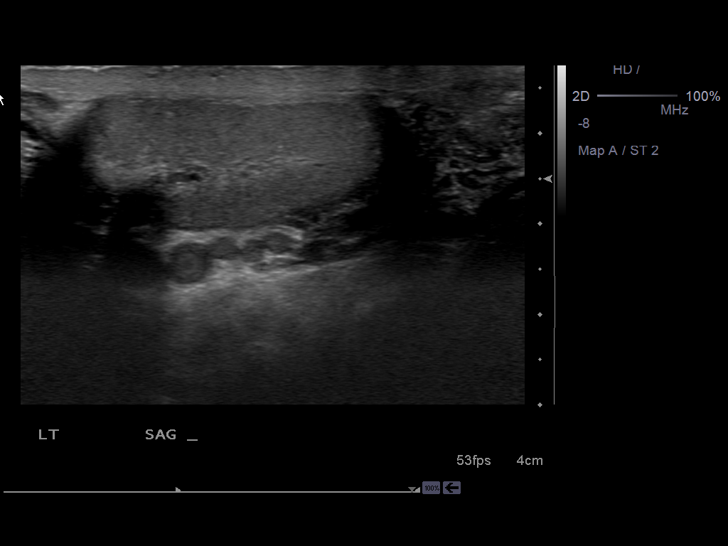
[im 31/50]
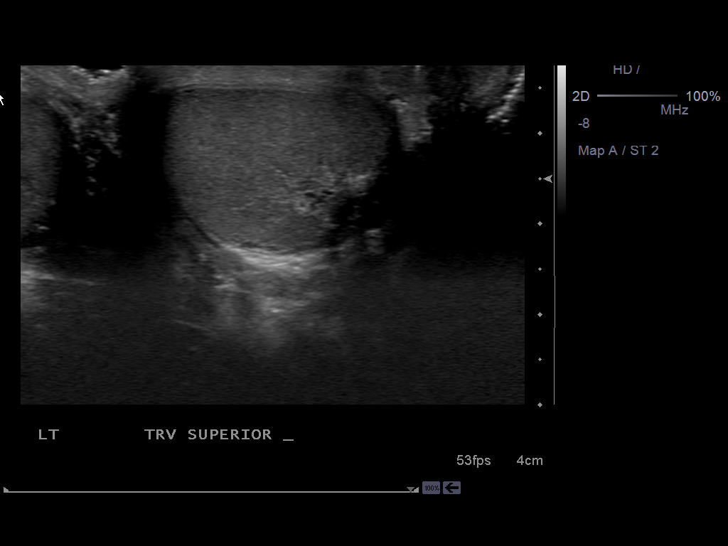
[im 33/50]
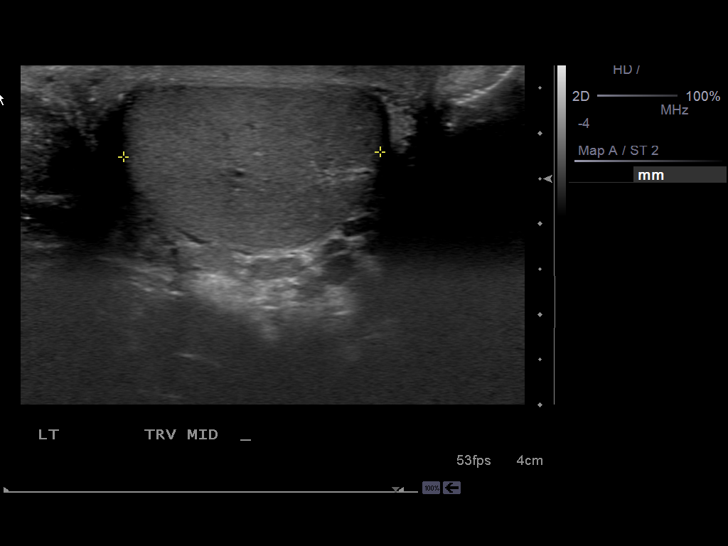
[im 37/50]
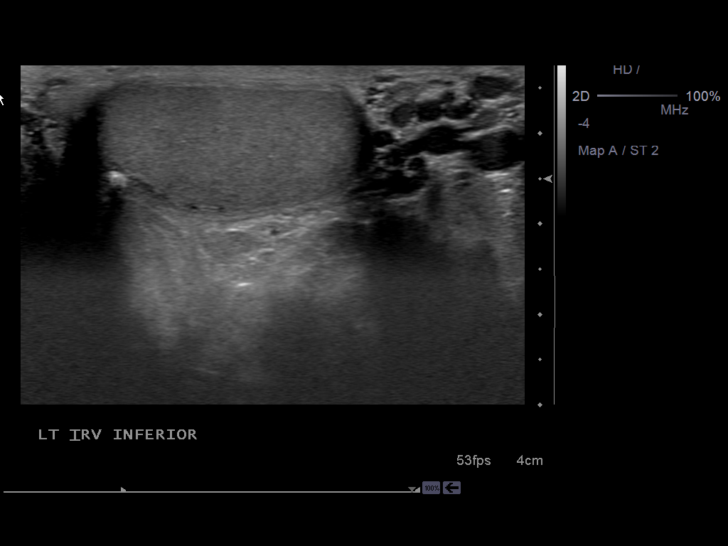
[im 41/50]
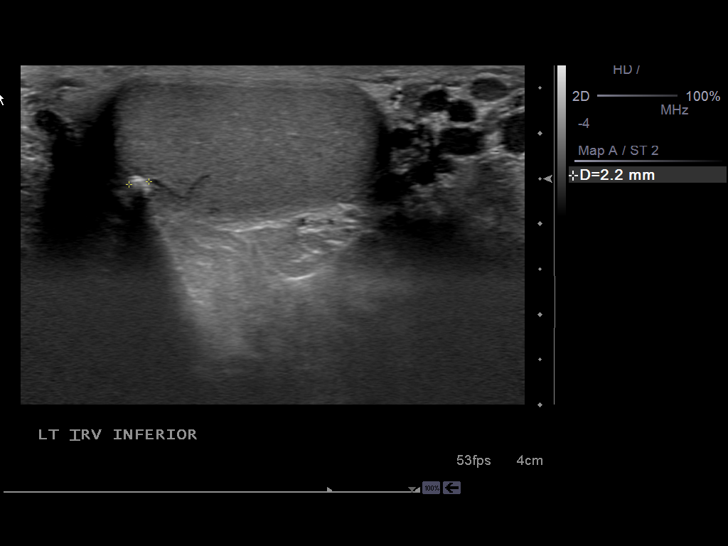
[im 45/50]
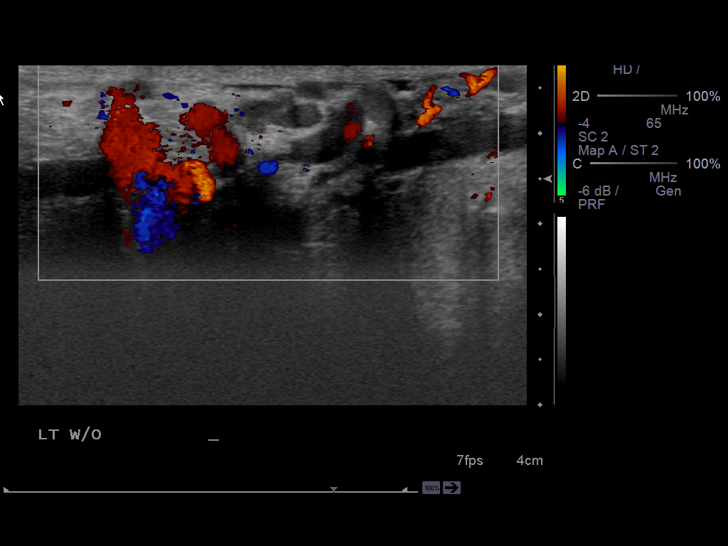
[im 50/50]
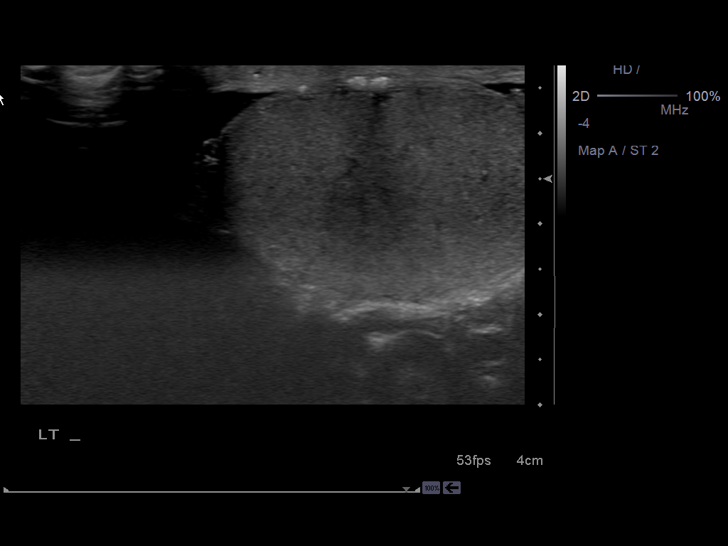

[14 of 25 positions shown; findings below may reference images not displayed]

FINDINGS: Right testicle:  Right testicle is homogeneous in echo texture with
normal color Doppler flow.  Right testicle measures 4.4 x 1.8 x
cm.

Superior to the right testicle there is a well-circumscribed mildly
complex round cystic collection which measures 0.7 x 0.7 x 0.8 cm.
This corresponds to the palpable area.  No vascularity.  Normal
right epididymis.

Left testicle:  Left testicle is homogeneous in echotexture with
normal color Doppler flow.  Left testicle measures 4.3 x 1.7 x
cm. There are several scrotal pearls along the inferior border of
the right testicle.

Normal left epididymis.  There is a varicocele associated with the
left hemi scrotum.
IMPRESSION: 1.  Small palpable cystic lesion in the region of the right
epididymis is most consistent with a small epididymal cyst or
spermatocele.  This is a benign finding.
2.  Normal testicles.
3.  Left varicocele.

## 2011-07-28 ENCOUNTER — Other Ambulatory Visit: Payer: Self-pay | Admitting: Internal Medicine

## 2011-08-24 ENCOUNTER — Encounter: Payer: Self-pay | Admitting: Internal Medicine

## 2011-08-24 ENCOUNTER — Ambulatory Visit (INDEPENDENT_AMBULATORY_CARE_PROVIDER_SITE_OTHER): Payer: 59 | Admitting: Internal Medicine

## 2011-08-24 ENCOUNTER — Other Ambulatory Visit (INDEPENDENT_AMBULATORY_CARE_PROVIDER_SITE_OTHER): Payer: 59

## 2011-08-24 VITALS — BP 102/70 | HR 84 | Temp 97.2°F | Ht 71.0 in | Wt 247.5 lb

## 2011-08-24 DIAGNOSIS — J309 Allergic rhinitis, unspecified: Secondary | ICD-10-CM

## 2011-08-24 DIAGNOSIS — E119 Type 2 diabetes mellitus without complications: Secondary | ICD-10-CM

## 2011-08-24 DIAGNOSIS — R21 Rash and other nonspecific skin eruption: Secondary | ICD-10-CM | POA: Insufficient documentation

## 2011-08-24 DIAGNOSIS — Z0000 Encounter for general adult medical examination without abnormal findings: Secondary | ICD-10-CM

## 2011-08-24 LAB — BASIC METABOLIC PANEL
BUN: 16 mg/dL (ref 6–23)
CO2: 28 mEq/L (ref 19–32)
Calcium: 9.3 mg/dL (ref 8.4–10.5)
GFR: 66.38 mL/min (ref >60.00)
Glucose, Bld: 134 mg/dL — ABNORMAL HIGH (ref 70–99)
Potassium: 4.3 mEq/L (ref 3.5–5.1)
Sodium: 140 mEq/L (ref 135–145)

## 2011-08-24 LAB — LIPID PANEL
Cholesterol: 132 mg/dL (ref 0–200)
HDL: 39.5 mg/dL (ref >39.00)
VLDL: 31.2 mg/dL (ref 0.0–40.0)

## 2011-08-24 LAB — HEMOGLOBIN A1C: Hgb A1c MFr Bld: 6.2 % (ref 4.6–6.5)

## 2011-08-24 MED ORDER — TRIAMCINOLONE ACETONIDE 0.1 % EX CREA: TOPICAL_CREAM | Freq: Two times a day (BID) | CUTANEOUS | Status: AC

## 2011-08-24 NOTE — Assessment & Plan Note (Signed)
C/w dermatitis, prob irriatative, for triam cr prn,  to f/u any worsening symptoms or concerns, consider dermatology

## 2011-08-24 NOTE — Assessment & Plan Note (Signed)
stable overall by hx and exam, most recent data reviewed with pt, and pt to continue medical treatment as before  Lab Results  Component Value Date   HGBA1C 6.4 02/16/2011

## 2011-08-24 NOTE — Patient Instructions (Signed)
Take all new medications as prescribed Continue all other medications as before Please go to LAB in the Basement for the blood and/or urine tests to be done today You will be contacted by phone if any changes need to be made immediately.  Otherwise, you will receive a letter about your results with an explanation. Please return in 6 mo with Lab testing done 3-5 days before  

## 2011-08-24 NOTE — Progress Notes (Signed)
Subjective:    Patient ID: [name], male    DOB: Sep 05, 1970, 41 y.o.   MRN: [id number]  HPI  Here to f/u; overall doing ok,  Pt denies chest pain, increased sob or doe, wheezing, orthopnea, PND, increased LE swelling, palpitations, dizziness or syncope.  Pt denies new neurological symptoms such as new headache, or facial or extremity weakness or numbness   Pt denies polydipsia, polyuria, or low sugar symptoms such as weakness or confusion improved with po intake.  Pt states overall good compliance with meds, trying to follow lower cholesterol, diabetic diet, wt overall stable but little exercise however.,  CBG' s in the 130 ;s in the AM.  Not much exercise s/p hernia surgury.  Lowest sugar is 90, no symptoms.  Pt denies fever, wt loss, night sweats, loss of appetite, or other constitutional symptoms.   S/p ventral hernia repair now doing well, avoiding lifting, no pain.   Does have  Pruritic rash to post knees for unclear reason, scratching makes it worse.   Just now also with start of mild allergy symptoms - nasal allergy symptoms with clear congestion, itch and sneeze, without fever, pain, ST, cough or wheezing.   Past Medical History  Diagnosis Date  . ALLERGIC RHINITIS 12/31/2008  . DIABETES MELLITUS, TYPE II 06/03/2009  . FOOT PAIN, RIGHT 08/08/2010  . HYPERSOMNIA 11/30/2008  . OBSTRUCTIVE SLEEP APNEA 02/03/2009  . OTHER INFLAMMATORY DISORDER MALE GENITAL ORGANS 11/30/2008  . Lumbar disc disease 03/01/2011   Past Surgical History  Procedure Date  . Hernia repair 2012    [facility]    reports that he has never smoked. He does not have any smokeless tobacco history on file. He reports that he drinks alcohol. He reports that he does not use illicit drugs. family history includes Cancer in his other; Coronary artery disease in his father; Depression in his maternal grandmother and mother; Diabetes in his father; Heart attack in his father; and Hypertension in his father. Allergies  Allergen  Reactions  . Penicillins    Current Outpatient Prescriptions on File Prior to Visit  Medication Sig Dispense Refill  . Alcohol Swabs (B-D SINGLE USE SWABS REGULAR) PADS Use as directed      . aspirin 81 MG tablet Take 81 mg by mouth daily.        . Blood Glucose Monitoring Suppl (ONE TOUCH BASIC SYSTEM) W/DEVICE KIT Use as directed       . glucose blood (FREESTYLE LITE) test strip 1 each by Other route 2 (two) times daily. Use as instructed       . glucose blood (ONE TOUCH TEST STRIPS) test strip Use as directed       . Lancets MISC Use as directed two times daily       . metFORMIN (GLUCOPHAGE-XR) 500 MG 24 hr tablet TAKE TWO TABLETS BY MOUTH TWO TIMES A DAY  360 tablet  3  . NON FORMULARY CPAP + 11 CM, full face mask       . ONE TOUCH LANCETS MISC Use as directed       . JANUVIA 100 MG tablet TAKE 1 TABLET BY MOUTH DAILY  90 tablet  1   Review of Systems Review of Systems  Constitutional: Negative for diaphoresis and unexpected weight change.  HENT: Negative for drooling and tinnitus.   Eyes: Negative for photophobia and visual disturbance.  Respiratory: Negative for choking and stridor.   Gastrointestinal: Negative for vomiting and blood in stool.  Genitourinary: Negative  for hematuria and decreased urine volume.   Objective:   Physical Exam BP 102/70  Pulse 84  Temp(Src) 97.2 F (36.2 C) (Oral)  Ht 5\' 11"  (1.803 m)  Wt 247 lb 8 oz (112.265 kg)  BMI 34.52 kg/m2  SpO2 97% Physical Exam  VS noted Constitutional: Pt appears well-developed and well-nourished.  HENT: Head: Normocephalic.  Right Ear: External ear normal.  Left Ear: External ear normal.  Eyes: Conjunctivae and EOM are normal. Pupils are equal, round, and reactive to light.  Neck: Normal range of motion. Neck supple.  Cardiovascular: Normal rate and regular rhythm.   Pulmonary/Chest: Effort normal and breath sounds normal.  Abd:  Soft, NT, non-distended, + BS Neurological: Pt is alert.  Skin: Skin is warm. No  erythema. Except for excoriative type rash to post knees approx 4x4 cm each, superfical, nontender, nonvesicular  Psychiatric: Pt behavior is normal. Thought content normal.     Assessment & Plan:

## 2011-08-24 NOTE — Assessment & Plan Note (Signed)
Mild to mod, for OTC allegra prn course,  to f/u any worsening symptoms or concerns

## 2011-11-09 ENCOUNTER — Other Ambulatory Visit: Payer: Self-pay | Admitting: Internal Medicine

## 2012-03-03 ENCOUNTER — Encounter: Payer: Self-pay | Admitting: Internal Medicine

## 2012-03-03 ENCOUNTER — Other Ambulatory Visit (INDEPENDENT_AMBULATORY_CARE_PROVIDER_SITE_OTHER): Payer: 59

## 2012-03-03 ENCOUNTER — Ambulatory Visit (INDEPENDENT_AMBULATORY_CARE_PROVIDER_SITE_OTHER): Payer: 59 | Admitting: Internal Medicine

## 2012-03-03 VITALS — BP 102/68 | HR 77 | Temp 99.1°F | Ht 71.0 in | Wt 243.1 lb

## 2012-03-03 DIAGNOSIS — Z23 Encounter for immunization: Secondary | ICD-10-CM

## 2012-03-03 DIAGNOSIS — Z0000 Encounter for general adult medical examination without abnormal findings: Secondary | ICD-10-CM

## 2012-03-03 DIAGNOSIS — E119 Type 2 diabetes mellitus without complications: Secondary | ICD-10-CM

## 2012-03-03 LAB — CBC WITH DIFFERENTIAL/PLATELET
Basophils Absolute: 0.1 10*3/uL (ref 0.0–0.1)
Basophils Relative: 0.6 % (ref 0.0–3.0)
Eosinophils Absolute: 0.2 10*3/uL (ref 0.0–0.7)
Hemoglobin: 16.9 g/dL (ref 13.0–17.0)
Lymphocytes Relative: 27.8 % (ref 12.0–46.0)
MCHC: 33.8 g/dL (ref 30.0–36.0)
MCV: 90.8 fl (ref 78.0–100.0)
Monocytes Absolute: 0.7 10*3/uL (ref 0.1–1.0)
Neutro Abs: 5.6 10*3/uL (ref 1.4–7.7)
Neutrophils Relative %: 61.5 % (ref 43.0–77.0)
RBC: 5.52 Mil/uL (ref 4.22–5.81)
RDW: 13.6 % (ref 11.5–14.6)

## 2012-03-03 LAB — URINALYSIS, ROUTINE W REFLEX MICROSCOPIC
Bilirubin Urine: NEGATIVE
Total Protein, Urine: NEGATIVE
Urine Glucose: NEGATIVE
Urobilinogen, UA: 0.2 (ref 0.0–1.0)

## 2012-03-03 LAB — MICROALBUMIN / CREATININE URINE RATIO
Creatinine,U: 121.5 mg/dL
Microalb Creat Ratio: 0.2 mg/g (ref 0.0–30.0)

## 2012-03-03 LAB — HEPATIC FUNCTION PANEL
AST: 25 U/L (ref 0–37)
Albumin: 4 g/dL (ref 3.5–5.2)
Alkaline Phosphatase: 60 U/L (ref 39–117)
Total Protein: 6.9 g/dL (ref 6.0–8.3)

## 2012-03-03 LAB — BASIC METABOLIC PANEL
CO2: 29 mEq/L (ref 19–32)
Calcium: 9.5 mg/dL (ref 8.4–10.5)
Glucose, Bld: 117 mg/dL — ABNORMAL HIGH (ref 70–99)
Sodium: 137 mEq/L (ref 135–145)

## 2012-03-03 LAB — PSA: PSA: 0.81 ng/mL (ref 0.10–4.00)

## 2012-03-03 LAB — LIPID PANEL: HDL: 36.6 mg/dL — ABNORMAL LOW (ref >39.00)

## 2012-03-03 NOTE — Patient Instructions (Addendum)
You had the flu shot today Continue all other medications as before Please have the pharmacy call with any refills you may need. Please continue your efforts at being more active, low cholesterol diet, and weight control. Please go to LAB in the Basement for the blood and/or urine tests to be done today You will be contacted by phone if any changes need to be made immediately.  Otherwise, you will receive a letter about your results with an explanation. Please remember to sign up for My Chart at your earliest convenience, as this will be important to you in the future with finding out test results. Please return in 6 mo with Lab testing done 3-5 days before

## 2012-03-03 NOTE — Assessment & Plan Note (Signed)
stable overall by hx and exam, most recent data reviewed with pt, and pt to continue medical treatment as before Lab Results  Component Value Date   HGBA1C 6.3 03/03/2012

## 2012-03-03 NOTE — Assessment & Plan Note (Signed)

## 2012-03-03 NOTE — Progress Notes (Signed)
Subjective:    Patient ID: [name], male    DOB: 1970/06/23, 41 y.o.   MRN: [id number]  HPI  Here for wellness and f/u;  Overall doing ok;  Pt denies CP, worsening SOB, DOE, wheezing, orthopnea, PND, worsening LE edema, palpitations, dizziness or syncope.  Pt denies neurological change such as new Headache, facial or extremity weakness.  Pt denies polydipsia, polyuria, or low sugar symptoms. Pt states overall good compliance with treatment and medications, good tolerability, and trying to follow lower cholesterol diet.  Pt denies worsening depressive symptoms, suicidal ideation or panic. No fever, wt loss, night sweats, loss of appetite, or other constitutional symptoms.  Pt states good ability with ADL's, low fall risk, home safety reviewed and adequate, no significant changes in hearing or vision, and occasionally active with exercise.  CBg's in low 100's. Lost 4 lbs since last visit, trying to be more active with running.  For flu shot.  Working 50 hrs per wk.  Past Medical History  Diagnosis Date  . ALLERGIC RHINITIS 12/31/2008  . DIABETES MELLITUS, TYPE II 06/03/2009  . FOOT PAIN, RIGHT 08/08/2010  . HYPERSOMNIA 11/30/2008  . OBSTRUCTIVE SLEEP APNEA 02/03/2009  . OTHER INFLAMMATORY DISORDER MALE GENITAL ORGANS 11/30/2008  . Lumbar disc disease 03/01/2011   Past Surgical History  Procedure Date  . Hernia repair 2012    [facility]    reports that he has never smoked. He does not have any smokeless tobacco history on file. He reports that he drinks alcohol. He reports that he does not use illicit drugs. family history includes Cancer in his other; Coronary artery disease in his father; Depression in his maternal grandmother and mother; Diabetes in his father; Heart attack in his father; and Hypertension in his father. Allergies  Allergen Reactions  . Penicillins    Current Outpatient Prescriptions on File Prior to Visit  Medication Sig Dispense Refill  . Alcohol Swabs (B-D SINGLE USE SWABS  REGULAR) PADS Use as directed      . aspirin 81 MG tablet Take 81 mg by mouth daily.        . Blood Glucose Monitoring Suppl (ONE TOUCH BASIC SYSTEM) W/DEVICE KIT Use as directed       . glucose blood (ONE TOUCH TEST STRIPS) test strip Use as directed       . JANUVIA 100 MG tablet TAKE 1 TABLET BY MOUTH DAILY  90 tablet  1  . metFORMIN (GLUCOPHAGE-XR) 500 MG 24 hr tablet TAKE TWO TABLETS BY MOUTH TWO TIMES A DAY  360 tablet  3  . NON FORMULARY CPAP + 11 CM, full face mask       . ONE TOUCH LANCETS MISC Use as directed       . ONE TOUCH ULTRA TEST test strip USE AS DIRECTED  100 each  11  . ONETOUCH DELICA LANCETS MISC USE AS DIRECTED  100 each  11  . triamcinolone cream (KENALOG) 0.1 % Apply topically 2 (two) times daily.  30 g  0   Review of Systems Physical Exam  VS noted Constitutional: Pt is oriented to person, place, and time. Appears well-developed and well-nourished.  HENT:  Head: Normocephalic and atraumatic.  Right Ear: External ear normal.  Left Ear: External ear normal.  Nose: Nose normal.  Mouth/Throat: Oropharynx is clear and moist.  Eyes: Conjunctivae and EOM are normal. Pupils are equal, round, and reactive to light.  Neck: Normal range of motion. Neck supple. No JVD present. No tracheal deviation  present.  Cardiovascular: Normal rate, regular rhythm, normal heart sounds and intact distal pulses.   Pulmonary/Chest: Effort normal and breath sounds normal.  Abdominal: Soft. Bowel sounds are normal. There is no tenderness.  Musculoskeletal: Normal range of motion. Exhibits no edema.  Lymphadenopathy:  Has no cervical adenopathy.  Neurological: Pt is alert and oriented to person, place, and time. Pt has normal reflexes. No cranial nerve deficit.  Skin: Skin is warm and dry. No rash noted.  Psychiatric:  Has  normal mood and affect. Behavior is normal.     Objective:   Physical Exam BP 102/68  Pulse 77  Temp 99.1 F (37.3 C) (Oral)  Ht 5\' 11"  (1.803 m)  Wt 243 lb 2 oz  (110.281 kg)  BMI 33.91 kg/m2  SpO2 96% Physical Exam  VS noted Constitutional: Pt is oriented to person, place, and time. Appears well-developed and well-nourished.  HENT:  Head: Normocephalic and atraumatic.  Right Ear: External ear normal.  Left Ear: External ear normal.  Nose: Nose normal.  Mouth/Throat: Oropharynx is clear and moist.  Eyes: Conjunctivae and EOM are normal. Pupils are equal, round, and reactive to light.  Neck: Normal range of motion. Neck supple. No JVD present. No tracheal deviation present.  Cardiovascular: Normal rate, regular rhythm, normal heart sounds and intact distal pulses.   Pulmonary/Chest: Effort normal and breath sounds normal.  Abdominal: Soft. Bowel sounds are normal. There is no tenderness.  Musculoskeletal: Normal range of motion. Exhibits no edema.  Lymphadenopathy:  Has no cervical adenopathy.  Neurological: Pt is alert and oriented to person, place, and time. Pt has normal reflexes. No cranial nerve deficit.  Skin: Skin is warm and dry. No rash noted.  Psychiatric:  Has  normal mood and affect. Behavior is normal.     Assessment & Plan:

## 2012-03-18 ENCOUNTER — Other Ambulatory Visit: Payer: Self-pay | Admitting: Internal Medicine

## 2012-03-18 ENCOUNTER — Ambulatory Visit: Payer: 59

## 2012-08-30 ENCOUNTER — Other Ambulatory Visit: Payer: Self-pay

## 2012-08-30 MED ORDER — GLUCOSE BLOOD VI STRP: ORAL_STRIP | Status: DC

## 2012-09-01 ENCOUNTER — Ambulatory Visit: Payer: 59 | Admitting: Internal Medicine

## 2012-09-08 ENCOUNTER — Ambulatory Visit (INDEPENDENT_AMBULATORY_CARE_PROVIDER_SITE_OTHER): Payer: 59 | Admitting: Internal Medicine

## 2012-09-08 ENCOUNTER — Other Ambulatory Visit (INDEPENDENT_AMBULATORY_CARE_PROVIDER_SITE_OTHER): Payer: 59

## 2012-09-08 ENCOUNTER — Encounter: Payer: Self-pay | Admitting: Internal Medicine

## 2012-09-08 VITALS — BP 102/70 | HR 74 | Temp 97.8°F | Ht 71.0 in | Wt 246.2 lb

## 2012-09-08 DIAGNOSIS — G4730 Sleep apnea, unspecified: Secondary | ICD-10-CM

## 2012-09-08 DIAGNOSIS — Z0000 Encounter for general adult medical examination without abnormal findings: Secondary | ICD-10-CM

## 2012-09-08 DIAGNOSIS — E119 Type 2 diabetes mellitus without complications: Secondary | ICD-10-CM

## 2012-09-08 DIAGNOSIS — J309 Allergic rhinitis, unspecified: Secondary | ICD-10-CM

## 2012-09-08 LAB — CBC WITH DIFFERENTIAL/PLATELET
Basophils Relative: 0.5 % (ref 0.0–3.0)
Eosinophils Relative: 2.1 % (ref 0.0–5.0)
Lymphocytes Relative: 29.3 % (ref 12.0–46.0)
Monocytes Relative: 7.6 % (ref 3.0–12.0)
Neutrophils Relative %: 60.5 % (ref 43.0–77.0)
RBC: 5.21 Mil/uL (ref 4.22–5.81)
WBC: 8.3 10*3/uL (ref 4.5–10.5)

## 2012-09-08 LAB — BASIC METABOLIC PANEL
BUN: 15 mg/dL (ref 6–23)
CO2: 30 mEq/L (ref 19–32)
Chloride: 105 mEq/L (ref 96–112)
Creatinine, Ser: 1.1 mg/dL (ref 0.4–1.5)

## 2012-09-08 LAB — HEPATIC FUNCTION PANEL
ALT: 35 U/L (ref 0–53)
Albumin: 4 g/dL (ref 3.5–5.2)
Total Bilirubin: 0.7 mg/dL (ref 0.3–1.2)
Total Protein: 6.9 g/dL (ref 6.0–8.3)

## 2012-09-08 LAB — LIPID PANEL
Cholesterol: 121 mg/dL (ref 0–200)
LDL Cholesterol: 58 mg/dL (ref 0–99)
Triglycerides: 153 mg/dL — ABNORMAL HIGH (ref 0.0–149.0)

## 2012-09-08 MED ORDER — SITAGLIPTIN PHOSPHATE 50 MG PO TABS: 50.0000 mg | ORAL_TABLET | Freq: Every day | ORAL | Status: DC

## 2012-09-08 NOTE — Progress Notes (Signed)
Subjective:    Patient ID: [name], male    DOB: April 21, 1971, 42 y.o.   MRN: [id number]  HPI  Here to f/u; overall doing ok,  Pt denies chest pain, increased sob or doe, wheezing, orthopnea, PND, increased LE swelling, palpitations, dizziness or syncope.  Pt denies polydipsia, polyuria, or low sugar symptoms such as weakness or confusion improved with po intake, but feels HA and "groggy" worse to move the head assoc with exercise.  CBG 123 pre-exercise today. Incidentally sugar has been close to 200 with the HA.   Pt denies new neurological symptoms such as new headache, or facial or extremity weakness or numbness.   Pt states overall good compliance with meds, has been trying to followbetter lower cholesterol, diabetic diet, with wt overall stable.  Lasat a1c 6.3 x 6 ago.  Has been training more for a upcoming 5k.  Has not been using the sleep apnea machine due to mask intolerance, but willing to try again. Peak wt was 270, now down to 246. Does have several wks ongoing nasal allergy symptoms with clearish congestion, itch and sneezing, without fever, pain, ST, cough, swelling or wheezing. Past Medical History  Diagnosis Date  . ALLERGIC RHINITIS 12/31/2008  . DIABETES MELLITUS, TYPE II 06/03/2009  . FOOT PAIN, RIGHT 08/08/2010  . HYPERSOMNIA 11/30/2008  . OBSTRUCTIVE SLEEP APNEA 02/03/2009  . OTHER INFLAMMATORY DISORDER MALE GENITAL ORGANS 11/30/2008  . Lumbar disc disease 03/01/2011   Past Surgical History  Procedure Laterality Date  . Hernia repair  2012    [facility]    reports that he has never smoked. He does not have any smokeless tobacco history on file. He reports that  drinks alcohol. He reports that he does not use illicit drugs. family history includes Cancer in his other; Coronary artery disease in his father; Depression in his maternal grandmother and mother; Diabetes in his father; Heart attack in his father; and Hypertension in his father. Allergies  Allergen Reactions  . Penicillins     Current Outpatient Prescriptions on File Prior to Visit  Medication Sig Dispense Refill  . Alcohol Swabs (B-D SINGLE USE SWABS REGULAR) PADS Use as directed      . aspirin 81 MG tablet Take 81 mg by mouth daily.        . Blood Glucose Monitoring Suppl (ONE TOUCH BASIC SYSTEM) W/DEVICE KIT Use as directed       . glucose blood (ONE TOUCH TEST STRIPS) test strip Use as directed       . glucose blood (ONE TOUCH ULTRA TEST) test strip Use as directed once daily to check blood sugar.  Diagnosis code 250.00  100 each  11  . JANUVIA 100 MG tablet TAKE 1 TABLET BY MOUTH DAILY  90 tablet  3  . metFORMIN (GLUCOPHAGE-XR) 500 MG 24 hr tablet TAKE TWO TABLETS BY MOUTH TWO TIMES A DAY  360 tablet  3  . NON FORMULARY CPAP + 11 CM, full face mask       . ONE TOUCH LANCETS MISC Use as directed       . ONETOUCH DELICA LANCETS MISC USE AS DIRECTED  100 each  11   No current facility-administered medications on file prior to visit.   Review of Systems  Constitutional: Negative for unexpected weight change, or unusual diaphoresis  HENT: Negative for tinnitus.   Eyes: Negative for photophobia and visual disturbance.  Respiratory: Negative for choking and stridor.   Gastrointestinal: Negative for vomiting and blood in  stool.  Genitourinary: Negative for hematuria and decreased urine volume.  Musculoskeletal: Negative for acute joint swelling Skin: Negative for color change and wound.  Neurological: Negative for tremors and numbness other than noted  Psychiatric/Behavioral: Negative for decreased concentration or  hyperactivity.       Objective:   Physical Exam BP 102/70  Pulse 74  Temp(Src) 97.8 F (36.6 C) (Oral)  Ht 5\' 11"  (1.803 m)  Wt 246 lb 4 oz (111.698 kg)  BMI 34.36 kg/m2  SpO2 97% VS noted,  Constitutional: Pt appears well-developed and well-nourished.  HENT: Head: NCAT.  Right Ear: External ear normal.  Left Ear: External ear normal.  Eyes: Conjunctivae and EOM are normal. Pupils  are equal, round, and reactive to light.  Neck: Normal range of motion. Neck supple.  Cardiovascular: Normal rate and regular rhythm.   Pulmonary/Chest: Effort normal and breath sounds normal.  Abd:  Soft, NT, non-distended, + BS Neurological: Pt is alert. Not confused  Skin: Skin is warm. No erythema.  Psychiatric: Pt behavior is normal. Thought content normal.     Assessment & Plan:

## 2012-09-08 NOTE — Assessment & Plan Note (Signed)
Mild to mod, for OTC allegra prn,  to f/u any worsening symptoms or concerns 

## 2012-09-08 NOTE — Patient Instructions (Addendum)
Please continue all other medications as before, except ok to take HALF of the [place] for now (and a new prescription has been sent) Please have the pharmacy call with any other refills you may need. Please continue your efforts at being more active, low cholesterol diet, and weight control. Please go to the LAB in the Basement (turn left off the elevator) for the tests to be done today You will be contacted by phone if any changes need to be made immediately.  Otherwise, you will receive a letter about your results with an explanation, but please check with MyChart first. Thank you for enrolling in MyChart. Please follow the instructions below to securely access your online medical record. MyChart allows you to send messages to your doctor, view your test results, renew your prescriptions, schedule appointments, and more. To Log into My Chart online, please go by [employer] or [employer] to [email or web address], or download the MyChart App from the [employer] of [employer] .  Your Username is: [email or web address] (pass [email or web address]) Please send a practice Message on Mychart later today. Please re-start your sleep apnea treatment, as less "stress" on the body at night can lead to better sugars and blood pressure during the day Please return in 6 months, or sooner if needed, with Lab testing done 3-5 days before

## 2012-09-08 NOTE — Assessment & Plan Note (Signed)
Encouraged compliacne with tx

## 2012-09-08 NOTE — Assessment & Plan Note (Addendum)
stable overall by history and exam except possible several low sugars, for januvia to 50 mg, recent data reviewed with pt, and pt to continue medical treatment as before,  to f/u any worsening symptoms or concerns Lab Results  Component Value Date   HGBA1C 6.4 09/08/2012

## 2012-09-27 ENCOUNTER — Telehealth: Payer: Self-pay | Admitting: Internal Medicine

## 2012-09-27 MED ORDER — SITAGLIPTIN PHOSPHATE 50 MG PO TABS: 50.0000 mg | ORAL_TABLET | Freq: Every day | ORAL | Status: DC

## 2012-09-27 MED ORDER — ONETOUCH LANCETS MISC: Status: DC

## 2012-09-27 MED ORDER — GLUCOSE BLOOD VI STRP: ORAL_STRIP | Status: DC

## 2012-09-27 NOTE — Telephone Encounter (Signed)
Refilled as patient requested and called the patient left message rx's sent in.

## 2012-09-27 NOTE — Telephone Encounter (Signed)
Pt req refill to be [facility] into [facility] on [address] in [place], [place]. Pt req One touch delica lancets, One touch test strips and januvia. Pt stated that he used to go to [facility], but [facility] and woud like to see if we can [facility]end [facility] to [facility]. Please call pt if this is ok.

## 2012-11-24 ENCOUNTER — Telehealth: Payer: Self-pay | Admitting: Internal Medicine

## 2012-11-24 MED ORDER — METFORMIN HCL ER 500 MG PO TB24: ORAL_TABLET | ORAL | Status: DC

## 2012-11-24 NOTE — Telephone Encounter (Signed)
Pt req refill for metformin.

## 2013-03-09 ENCOUNTER — Ambulatory Visit (INDEPENDENT_AMBULATORY_CARE_PROVIDER_SITE_OTHER): Payer: 59 | Admitting: Internal Medicine

## 2013-03-09 ENCOUNTER — Encounter: Payer: Self-pay | Admitting: Internal Medicine

## 2013-03-09 ENCOUNTER — Other Ambulatory Visit (INDEPENDENT_AMBULATORY_CARE_PROVIDER_SITE_OTHER): Payer: 59

## 2013-03-09 VITALS — BP 110/80 | HR 83 | Temp 98.6°F | Ht 71.0 in | Wt 241.5 lb

## 2013-03-09 DIAGNOSIS — E119 Type 2 diabetes mellitus without complications: Secondary | ICD-10-CM

## 2013-03-09 DIAGNOSIS — Z23 Encounter for immunization: Secondary | ICD-10-CM

## 2013-03-09 DIAGNOSIS — M545 Low back pain, unspecified: Secondary | ICD-10-CM

## 2013-03-09 DIAGNOSIS — Z0000 Encounter for general adult medical examination without abnormal findings: Secondary | ICD-10-CM

## 2013-03-09 LAB — URINALYSIS, ROUTINE W REFLEX MICROSCOPIC
Bilirubin Urine: NEGATIVE
Leukocytes, UA: NEGATIVE
Nitrite: NEGATIVE
RBC / HPF: NONE SEEN (ref 0–?)
Specific Gravity, Urine: 1.025 (ref 1.000–1.030)
Total Protein, Urine: NEGATIVE
pH: 6 (ref 5.0–8.0)

## 2013-03-09 LAB — HEPATIC FUNCTION PANEL
ALT: 29 U/L (ref 0–53)
AST: 21 U/L (ref 0–37)
Albumin: 4.1 g/dL (ref 3.5–5.2)
Alkaline Phosphatase: 64 U/L (ref 39–117)
Bilirubin, Direct: 0.1 mg/dL (ref 0.0–0.3)

## 2013-03-09 LAB — HEMOGLOBIN A1C: Hgb A1c MFr Bld: 6.9 % — ABNORMAL HIGH (ref 4.6–6.5)

## 2013-03-09 LAB — LIPID PANEL
Cholesterol: 140 mg/dL (ref 0–200)
LDL Cholesterol: 70 mg/dL (ref 0–99)
VLDL: 30.6 mg/dL (ref 0.0–40.0)

## 2013-03-09 LAB — CBC WITH DIFFERENTIAL/PLATELET
Eosinophils Relative: 2.1 % (ref 0.0–5.0)
Lymphocytes Relative: 27 % (ref 12.0–46.0)
Monocytes Relative: 7.2 % (ref 3.0–12.0)
Neutrophils Relative %: 62.4 % (ref 43.0–77.0)
Platelets: 201 10*3/uL (ref 150.0–400.0)
WBC: 8.7 10*3/uL (ref 4.5–10.5)

## 2013-03-09 LAB — MICROALBUMIN / CREATININE URINE RATIO
Microalb Creat Ratio: 0.2 mg/g (ref 0.0–30.0)
Microalb, Ur: 0.2 mg/dL (ref 0.0–1.9)

## 2013-03-09 LAB — BASIC METABOLIC PANEL
BUN: 16 mg/dL (ref 6–23)
CO2: 28 mEq/L (ref 19–32)
Chloride: 105 mEq/L (ref 96–112)
GFR: 76.96 mL/min (ref >60.00)
Glucose, Bld: 144 mg/dL — ABNORMAL HIGH (ref 70–99)
Potassium: 4.9 mEq/L (ref 3.5–5.1)

## 2013-03-09 LAB — TSH: TSH: 2.44 u[IU]/mL (ref 0.35–5.50)

## 2013-03-09 LAB — PSA: PSA: 0.83 ng/mL (ref 0.10–4.00)

## 2013-03-09 MED ORDER — LISINOPRIL 5 MG PO TABS: 5.0000 mg | ORAL_TABLET | Freq: Every day | ORAL | Status: DC

## 2013-03-09 MED ORDER — NAPROXEN 500 MG PO TABS: 500.0000 mg | ORAL_TABLET | Freq: Two times a day (BID) | ORAL | Status: DC

## 2013-03-09 MED ORDER — CYCLOBENZAPRINE HCL 5 MG PO TABS: 5.0000 mg | ORAL_TABLET | Freq: Three times a day (TID) | ORAL | Status: DC | PRN

## 2013-03-09 NOTE — Patient Instructions (Addendum)
You had the flu shot today OK to stop the [place] Please take all new medication as prescribed - the antiinflammatory and muscle relaxer as needed, as well as the new medication called Lisinopril 5 mg per day, a type of blood presssure medication but very low dose for you, which is the standard of care as this helps protect the kidneys from damage from blood pressure  Please continue all other medications as before, and refills have been done if requested. Please have the pharmacy call with any other refills you may need. Please continue your efforts at being more active, low cholesterol diet, and weight control. You are otherwise up to date with prevention measures todVery busy with alses ay. Please keep your appointments with your specialists as you may have planned Please go to the LAB in the Basement (turn left off the elevator) for the tests to be done today You will be contacted by phone if any changes need to be made immediately.  Otherwise, you will receive a letter about your results with an explanation, but please check with MyChart first.  Please remember to sign up for My Chart if you have not done so, as this will be important to you in the future with finding out test results, communicating by private email, and scheduling acute appointments online when needed.  Please return in 6 months, or sooner if needed

## 2013-03-09 NOTE — Assessment & Plan Note (Signed)

## 2013-03-09 NOTE — Progress Notes (Signed)
Subjective:    Patient ID: [name], male    DOB: 10-10-1970, 42 y.o.   MRN: [id number]  HPI  Here for wellness and f/u;  Overall doing ok;  Pt denies CP, worsening SOB, DOE, wheezing, orthopnea, PND, worsening LE edema, palpitations, dizziness or syncope.  Pt denies neurological change such as new headache, facial or extremity weakness.  Pt denies polydipsia, polyuria, or low sugar symptoms. Pt states overall good compliance with treatment and medications, good tolerability, and has been trying to follow lower cholesterol diet.  Pt denies worsening depressive symptoms, suicidal ideation or panic. No fever, night sweats, wt loss, loss of appetite, or other constitutional symptoms.  Pt states good ability with ADL's, has low fall risk, home safety reviewed and adequate, no other significant changes in hearing or vision, and occasionally active with exercise, ran a 5k last may, plans to start doing more.  Stil working in Airline pilot , very busy.  Did lose about 5 lbs since April 2014, waist size has gone down.Does not take the [place] dialy as he tends to have lower sugars and weakness.  Also, c/o bilat LBP , mild to mod, intermittent, worse to stand or bend, better after starts walking , some better to lie down as well, no LE pain/weakness/numb and no bowel or bladder change, fever, wt loss,  worsening LE pain/numbness/weakness, gait change or falls.  Works in Product/process development scientist Past Medical History  Diagnosis Date  . ALLERGIC RHINITIS 12/31/2008  . DIABETES MELLITUS, TYPE II 06/03/2009  . FOOT PAIN, RIGHT 08/08/2010  . HYPERSOMNIA 11/30/2008  . OBSTRUCTIVE SLEEP APNEA 02/03/2009  . OTHER INFLAMMATORY DISORDER MALE GENITAL ORGANS 11/30/2008  . Lumbar disc disease 03/01/2011   Past Surgical History  Procedure Laterality Date  . Hernia repair  2012    [facility]    reports that he has never smoked. He does not have any smokeless tobacco history on file. He reports that he drinks alcohol. He  reports that he does not use illicit drugs. family history includes Cancer in his other; Coronary artery disease in his father; Depression in his maternal grandmother and mother; Diabetes in his father; Heart attack in his father; Hypertension in his father. Allergies  Allergen Reactions  . Penicillins    Current Outpatient Prescriptions on File Prior to Visit  Medication Sig Dispense Refill  . Alcohol Swabs (B-D SINGLE USE SWABS REGULAR) PADS Use as directed      . aspirin 81 MG tablet Take 81 mg by mouth daily.        . Blood Glucose Monitoring Suppl (ONE TOUCH BASIC SYSTEM) W/DEVICE KIT Use as directed       . glucose blood (ONE TOUCH TEST STRIPS) test strip Use as directed  100 each  6  . glucose blood (ONE TOUCH ULTRA TEST) test strip Use as directed once daily to check blood sugar.  Diagnosis code 250.00  100 each  11  . metFORMIN (GLUCOPHAGE-XR) 500 MG 24 hr tablet Take 2 by mouth 2 times a day  360 tablet  3  . NON FORMULARY CPAP + 11 CM, full face mask       . ONE TOUCH LANCETS MISC Use as directed once daily to check blood sugar.  Diagnosis code 250.00  100 each  6  . ONETOUCH DELICA LANCETS MISC USE AS DIRECTED  100 each  11  . sitaGLIPtin (JANUVIA) 50 MG tablet Take 1 tablet (50 mg total) by mouth daily.  90 tablet  3   No current facility-administered medications on file prior to visit.   Review of Systems Constitutional: Negative for diaphoresis, activity change, appetite change or unexpected weight change.  HENT: Negative for hearing loss, ear pain, facial swelling, mouth sores and neck stiffness.   Eyes: Negative for pain, redness and visual disturbance.  Respiratory: Negative for shortness of breath and wheezing.   Cardiovascular: Negative for chest pain and palpitations.  Gastrointestinal: Negative for diarrhea, blood in stool, abdominal distention or other pain Genitourinary: Negative for hematuria, flank pain or change in urine volume.  Musculoskeletal: Negative for  myalgias and joint swelling.  Skin: Negative for color change and wound.  Neurological: Negative for syncope and numbness. other than noted Hematological: Negative for adenopathy.  Psychiatric/Behavioral: Negative for hallucinations, self-injury, decreased concentration and agitation.      Objective:   Physical Exam BP 110/80  Pulse 83  Temp(Src) 98.6 F (37 C) (Oral)  Ht 5\' 11"  (1.803 m)  Wt 241 lb 8 oz (109.544 kg)  BMI 33.7 kg/m2  SpO2 96% VS noted,  Constitutional: Pt is oriented to person, place, and time. Appears well-developed and well-nourished.  Head: Normocephalic and atraumatic.  Right Ear: External ear normal.  Left Ear: External ear normal.  Nose: Nose normal.  Mouth/Throat: Oropharynx is clear and moist.  Eyes: Conjunctivae and EOM are normal. Pupils are equal, round, and reactive to light.  Neck: Normal range of motion. Neck supple. No JVD present. No tracheal deviation present.  Cardiovascular: Normal rate, regular rhythm, normal heart sounds and intact distal pulses.   Pulmonary/Chest: Effort normal and breath sounds normal.  Abdominal: Soft. Bowel sounds are normal. There is no tenderness. No HSM  Musculoskeletal: Normal range of motion. Exhibits no edema.  Lymphadenopathy:  Has no cervical adenopathy.  Neurological: Pt is alert and oriented to person, place, and time. Pt has normal reflexes. No cranial nerve deficit. Motor/sens/dtr/gait intact Skin: Skin is warm and dry. No rash noted.  Psychiatric:  Has  normal mood and affect. Behavior is normal.  Spine nontender Has mild bilat lumbar paravertebral tender    Assessment & Plan:

## 2013-03-09 NOTE — Assessment & Plan Note (Signed)
Ok to stop the [place] for now with lower sugars, for a1c today

## 2013-03-09 NOTE — Assessment & Plan Note (Signed)
C/w pain likely from known lumbar disc disease, for nsaid, muscle relaxer prn, to f/u any worsening symptoms or concerns

## 2013-06-09 ENCOUNTER — Telehealth: Payer: Self-pay | Admitting: *Deleted

## 2013-06-09 MED ORDER — SITAGLIPTIN PHOSPHATE 100 MG PO TABS
100.0000 mg | ORAL_TABLET | Freq: Every day | ORAL | Status: DC
Start: 2013-06-09 — End: 2014-11-14

## 2013-06-09 NOTE — Telephone Encounter (Signed)
Ok for re-start  Check cbgs at least twice per day for 1 wk, then call with results - [phone]

## 2013-06-09 NOTE — Telephone Encounter (Signed)
Patient phoned inquiring about resuming his previously d/c'ed Januvia.  States his blood sugars have been elevated.  Please advise.   CB# [phone]

## 2013-06-13 NOTE — Telephone Encounter (Signed)
Notified patient via voicemail message of MD response & instructions and that Januvia had been escribed to his pharmacy on file & that if he had any questions/concerns to phone office & speak to Robin.

## 2013-09-08 ENCOUNTER — Ambulatory Visit: Payer: 59 | Admitting: Internal Medicine

## 2013-09-22 ENCOUNTER — Encounter: Payer: Self-pay | Admitting: Internal Medicine

## 2013-09-22 ENCOUNTER — Other Ambulatory Visit (INDEPENDENT_AMBULATORY_CARE_PROVIDER_SITE_OTHER): Payer: 59

## 2013-09-22 ENCOUNTER — Ambulatory Visit (INDEPENDENT_AMBULATORY_CARE_PROVIDER_SITE_OTHER): Payer: 59 | Admitting: Internal Medicine

## 2013-09-22 VITALS — BP 110/80 | HR 82 | Temp 97.7°F | Ht 71.0 in | Wt 242.4 lb

## 2013-09-22 DIAGNOSIS — Z0000 Encounter for general adult medical examination without abnormal findings: Secondary | ICD-10-CM

## 2013-09-22 DIAGNOSIS — J309 Allergic rhinitis, unspecified: Secondary | ICD-10-CM

## 2013-09-22 DIAGNOSIS — M545 Low back pain, unspecified: Secondary | ICD-10-CM

## 2013-09-22 DIAGNOSIS — E1165 Type 2 diabetes mellitus with hyperglycemia: Principal | ICD-10-CM

## 2013-09-22 DIAGNOSIS — E119 Type 2 diabetes mellitus without complications: Secondary | ICD-10-CM

## 2013-09-22 DIAGNOSIS — IMO0001 Reserved for inherently not codable concepts without codable children: Secondary | ICD-10-CM

## 2013-09-22 LAB — BASIC METABOLIC PANEL
BUN: 14 mg/dL (ref 6–23)
CHLORIDE: 102 meq/L (ref 96–112)
CO2: 26 mEq/L (ref 19–32)
Calcium: 9.2 mg/dL (ref 8.4–10.5)
Creatinine, Ser: 1.2 mg/dL (ref 0.4–1.5)
GFR: 72.24 mL/min (ref >60.00)
Glucose, Bld: 150 mg/dL — ABNORMAL HIGH (ref 70–99)
POTASSIUM: 4.1 meq/L (ref 3.5–5.1)
Sodium: 138 mEq/L (ref 135–145)

## 2013-09-22 LAB — HEPATIC FUNCTION PANEL
ALBUMIN: 4.2 g/dL (ref 3.5–5.2)
ALT: 35 U/L (ref 0–53)
AST: 28 U/L (ref 0–37)
Alkaline Phosphatase: 58 U/L (ref 39–117)
Bilirubin, Direct: 0.1 mg/dL (ref 0.0–0.3)
TOTAL PROTEIN: 6.9 g/dL (ref 6.0–8.3)
Total Bilirubin: 0.7 mg/dL (ref 0.3–1.2)

## 2013-09-22 LAB — LIPID PANEL
Cholesterol: 129 mg/dL (ref 0–200)
HDL: 37.2 mg/dL — ABNORMAL LOW (ref >39.00)
LDL Cholesterol: 41 mg/dL (ref 0–99)
Total CHOL/HDL Ratio: 3
Triglycerides: 255 mg/dL — ABNORMAL HIGH (ref 0.0–149.0)
VLDL: 51 mg/dL — ABNORMAL HIGH (ref 0.0–40.0)

## 2013-09-22 LAB — HEMOGLOBIN A1C: HEMOGLOBIN A1C: 6.7 % — AB (ref 4.6–6.5)

## 2013-09-22 NOTE — Progress Notes (Signed)
Pre visit review using our clinic review tool, if applicable. No additional management support is needed unless otherwise documented below in the visit note. 

## 2013-09-22 NOTE — Progress Notes (Signed)
Subjective:    Patient ID: [name], male    DOB: [birth year], 43 y.o.   MRN: [id number]  HPI Here to f/u; overall doing ok,  Pt denies chest pain, increased sob or doe, wheezing, orthopnea, PND, increased LE swelling, palpitations, dizziness or syncope.  Pt denies polydipsia, polyuria, or low sugar symptoms such as weakness or confusion improved with po intake.  Pt denies new neurological symptoms such as new headache, or facial or extremity weakness or numbness.   Pt states overall good compliance with meds, has been trying to follow lower cholesterol, diabetic diet, with wt overall stable, though has been excercising more, building up some muscle, lost some fat, overall wt no change. Pt continues to have recurring mild LBP without change in severity, bowel or bladder change, fever, wt loss,  worsening LE pain/numbness/weakness, gait change or falls.  Past Medical History  Diagnosis Date  . ALLERGIC RHINITIS 12/31/2008  . DIABETES MELLITUS, TYPE II 06/03/2009  . FOOT PAIN, RIGHT 08/08/2010  . HYPERSOMNIA 11/30/2008  . OBSTRUCTIVE SLEEP APNEA 02/03/2009  . OTHER INFLAMMATORY DISORDER MALE GENITAL ORGANS 11/30/2008  . Lumbar disc disease 03/01/2011   Past Surgical History  Procedure Laterality Date  . Hernia repair  2012    [facility]    reports that he has never smoked. He does not have any smokeless tobacco history on file. He reports that he drinks alcohol. He reports that he does not use illicit drugs. family history includes Cancer in his other; Coronary artery disease in his father; Depression in his maternal grandmother and mother; Diabetes in his father; Heart attack in his father; Hypertension in his father. Allergies  Allergen Reactions  . Penicillins    Current Outpatient Prescriptions on File Prior to Visit  Medication Sig Dispense Refill  . Alcohol Swabs (B-D SINGLE USE SWABS REGULAR) PADS Use as directed      . aspirin 81 MG tablet Take 81 mg by mouth daily.        . Blood  Glucose Monitoring Suppl ([place]) W/DEVICE KIT Use as directed       . cyclobenzaprine (FLEXERIL) 5 MG tablet Take 1 tablet (5 mg total) by mouth 3 (three) times daily as needed for muscle spasms.  60 tablet  2  . glucose blood (ONE TOUCH TEST STRIPS) test strip Use as directed  100 each  6  . glucose blood (ONE TOUCH ULTRA TEST) test strip Use as directed once daily to check blood sugar.  Diagnosis code 250.00  100 each  11  . lisinopril (PRINIVIL,ZESTRIL) 5 MG tablet Take 1 tablet (5 mg total) by mouth daily.  90 tablet  3  . metFORMIN (GLUCOPHAGE-XR) 500 MG 24 hr tablet Take 2 by mouth 2 times a day  360 tablet  3  . naproxen (NAPROSYN) 500 MG tablet Take 1 tablet (500 mg total) by mouth 2 (two) times daily with a meal.  60 tablet  4  . NON FORMULARY CPAP + 11 CM, full face mask       . ONE TOUCH LANCETS MISC Use as directed once daily to check blood sugar.  Diagnosis code 250.00  100 each  6  . ONETOUCH DELICA LANCETS MISC USE AS DIRECTED  100 each  11  . sitaGLIPtin (JANUVIA) 100 MG tablet Take 1 tablet (100 mg total) by mouth daily.  90 tablet  3   No current facility-administered medications on file prior to visit.   Review of Systems  Constitutional: Negative for unusual diaphoresis or other sweats  HENT: Negative for ringing in ear Eyes: Negative for double vision or worsening visual disturbance.  Respiratory: Negative for choking and stridor.   Gastrointestinal: Negative for vomiting or other signifcant bowel change Genitourinary: Negative for hematuria or decreased urine volume.  Musculoskeletal: Negative for other MSK pain or swelling Skin: Negative for color change and worsening wound.  Neurological: Negative for tremors and numbness other than noted  Psychiatric/Behavioral: Negative for decreased concentration or agitation other than above       Objective:   Physical Exam BP 110/80  Pulse 82  Temp(Src) 97.7 F (36.5 C) (Oral)  Ht 5' 11" (1.803 m)  Wt  242 lb 6 oz (109.941 kg)  BMI 33.82 kg/m2  SpO2 96% VS noted,  Constitutional: Pt appears well-developed, well-nourished. Annabell Sabal HENT: Head: NCAT.  Right Ear: External ear normal.  Left Ear: External ear normal.  Eyes: . Pupils are equal, round, and reactive to light. Conjunctivae and EOM are normal Neck: Normal range of motion. Neck supple.  Cardiovascular: Normal rate and regular rhythm.   Pulmonary/Chest: Effort normal and breath sounds normal.  Abd:  Soft, NT, ND, + BS Neurological: Pt is alert. Not confused , motor grossly intact Skin: Skin is warm. No rash Psychiatric: Pt behavior is normal. No agitation.      Assessment & Plan:

## 2013-09-22 NOTE — Assessment & Plan Note (Signed)
stable overall by history and exam, recent data reviewed with pt, and pt to continue medical treatment as before,  to f/u any worsening symptoms or concerns Lab Results  Component Value Date   HGBA1C 6.9* 03/09/2013    

## 2013-09-22 NOTE — Assessment & Plan Note (Signed)
For otc allegra prn,,  to f/u any worsening symptoms or concerns

## 2013-09-22 NOTE — Assessment & Plan Note (Signed)
stable overall by history and exam, exam benign, no neuro change and pt to continue medical treatment as before,  to f/u any worsening symptoms or concerns

## 2013-09-22 NOTE — Patient Instructions (Signed)
Please continue all other medications as before, and refills have been done if requested. Please have the pharmacy call with any other refills you may need.  Please continue your efforts at being more active, low cholesterol diet, and weight control.  Please go to the LAB in the Basement (turn left off the elevator) for the tests to be done today  You will be contacted by phone if any changes need to be made immediately.  Otherwise, you will receive a letter about your results with an explanation, but please check with MyChart first.  Please remember to sign up for MyChart if you have not done so, as this will be important to you in the future with finding out test results, communicating by private email, and scheduling acute appointments online when needed.  Please return in 6 months, or sooner if needed, with Lab testing done 3-5 days before

## 2013-10-25 ENCOUNTER — Encounter (HOSPITAL_COMMUNITY): Payer: Self-pay | Admitting: Emergency Medicine

## 2013-10-25 ENCOUNTER — Emergency Department (HOSPITAL_COMMUNITY)
Admission: EM | Admit: 2013-10-25 | Discharge: 2013-10-25 | Disposition: A | Discharge status: Home / Self Care | Payer: 59 | Source: Home / Self Care | Attending: Family Medicine | Admitting: Family Medicine

## 2013-10-25 ENCOUNTER — Emergency Department (INDEPENDENT_AMBULATORY_CARE_PROVIDER_SITE_OTHER): Payer: 59

## 2013-10-25 DIAGNOSIS — S9030XA Contusion of unspecified foot, initial encounter: Secondary | ICD-10-CM

## 2013-10-25 NOTE — Discharge Instructions (Signed)
Thank you for coming in today. Take up to 2 aleve twice daily for pain as needed.  Use the boot as needed.  Come back or follow up with your doctor as needed.    Foot Contusion A foot contusion is a deep bruise to the foot. Contusions are the result of an injury that caused bleeding under the skin. The contusion may turn blue, purple, or yellow. Minor injuries will give you a painless contusion, but more severe contusions may stay painful and swollen for a few weeks. CAUSES  A foot contusion comes from a direct blow to that area, such as a heavy object falling on the foot. SYMPTOMS   Swelling of the foot.  Discoloration of the foot.  Tenderness or soreness of the foot. DIAGNOSIS  You will have a physical exam and will be asked about your history. You may need an X-ray of your foot to look for a broken bone (fracture).  TREATMENT  An elastic wrap may be recommended to support your foot. Resting, elevating, and applying cold compresses to your foot are often the best treatments for a foot contusion. Over-the-counter medicines may also be recommended for pain control. HOME CARE INSTRUCTIONS   Put ice on the injured area.  Put ice in a plastic bag.  Place a towel between your skin and the bag.  Leave the ice on for 15-20 minutes, 03-04 times a day.  Only take over-the-counter or prescription medicines for pain, discomfort, or fever as directed by your caregiver.  If told, use an elastic wrap as directed. This can help reduce swelling. You may remove the wrap for sleeping, showering, and bathing. If your toes become numb, cold, or blue, take the wrap off and reapply it more loosely.  Elevate your foot with pillows to reduce swelling.  Try to avoid standing or walking while the foot is painful. Do not resume use until instructed by your caregiver. Then, begin use gradually. If pain develops, decrease use. Gradually increase activities that do not cause discomfort until you have normal  use of your foot.  See your caregiver as directed. It is very important to keep all follow-up appointments in order to avoid any lasting problems with your foot, including long-term (chronic) pain. SEEK IMMEDIATE MEDICAL CARE IF:   You have increased redness, swelling, or pain in your foot.  Your swelling or pain is not relieved with medicines.  You have loss of feeling in your foot or are unable to move your toes.  Your foot turns cold or blue.  You have pain when you move your toes.  Your foot becomes warm to the touch.  Your contusion does not improve in 2 days. MAKE SURE YOU:   Understand these instructions.  Will watch your condition.  Will get help right away if you are not doing well or get worse. Document Released: 03/02/2006 Document Revised: 11/10/2011 Document Reviewed: 04/14/2011 [facility] Patient Information 2014 [place], [place].

## 2013-10-25 NOTE — ED Provider Notes (Signed)
[name] is a 43 y.o. male who presents to Urgent Care today for right lateral foot pain. Patient became entangled in his dogs leash yesterday evening and fell to the ground. He hit the lateral aspect of his right foot on a board on the way down. He notes pain and swelling in a row in the fifth metatarsal. He notes pain with activity. He denies any radiating pain weakness or numbness. Pain is moderate. He has not tried any medications.   Past Medical History  Diagnosis Date  . ALLERGIC RHINITIS 12/31/2008  . DIABETES MELLITUS, TYPE II 06/03/2009  . FOOT PAIN, RIGHT 08/08/2010  . HYPERSOMNIA 11/30/2008  . OBSTRUCTIVE SLEEP APNEA 02/03/2009  . OTHER INFLAMMATORY DISORDER MALE GENITAL ORGANS 11/30/2008  . Lumbar disc disease 03/01/2011   History  Substance Use Topics  . Smoking status: Never Smoker   . Smokeless tobacco: Not on file  . Alcohol Use: Yes     Comment: social   ROS as above Medications: No current facility-administered medications for this encounter.   Current Outpatient Prescriptions  Medication Sig Dispense Refill  . metFORMIN (GLUCOPHAGE-XR) 500 MG 24 hr tablet Take 2 by mouth 2 times a day  360 tablet  3  . sitaGLIPtin (JANUVIA) 100 MG tablet Take 1 tablet (100 mg total) by mouth daily.  90 tablet  3  . Alcohol Swabs (B-D SINGLE USE SWABS REGULAR) PADS Use as directed      . aspirin 81 MG tablet Take 81 mg by mouth daily.        . Blood Glucose Monitoring Suppl ([place]) W/DEVICE KIT Use as directed       . cyclobenzaprine (FLEXERIL) 5 MG tablet Take 1 tablet (5 mg total) by mouth 3 (three) times daily as needed for muscle spasms.  60 tablet  2  . glucose blood (ONE TOUCH TEST STRIPS) test strip Use as directed  100 each  6  . glucose blood (ONE TOUCH ULTRA TEST) test strip Use as directed once daily to check blood sugar.  Diagnosis code 250.00  100 each  11  . lisinopril (PRINIVIL,ZESTRIL) 5 MG tablet Take 1 tablet (5 mg total) by mouth daily.  90 tablet  3   . naproxen (NAPROSYN) 500 MG tablet Take 1 tablet (500 mg total) by mouth 2 (two) times daily with a meal.  60 tablet  4  . NON FORMULARY CPAP + 11 CM, full face mask       . ONE TOUCH LANCETS MISC Use as directed once daily to check blood sugar.  Diagnosis code 250.00  100 each  6  . ONETOUCH DELICA LANCETS MISC USE AS DIRECTED  100 each  11    Exam:  BP 136/87  Pulse 85  Temp(Src) 98.2 F (36.8 C) (Oral)  Resp 18  SpO2 100% Gen: Well NAD Right foot: Moderately tender over the proximal fifth metatarsal with some mild swelling and ecchymosis in the same area. Pain with resisted foot eversion. Pulses and capillary refill and sensation are intact distally.  Patient has an antalgic gait  No results found for this or any previous visit (from the past 24 hour(s)). Dg Foot Complete Right  10/25/2013   CLINICAL DATA:  Pain post trauma  EXAM: RIGHT FOOT COMPLETE - 3+ VIEW  COMPARISON:  None.  FINDINGS: Frontal, oblique, and lateral views were obtained. There is no fracture or dislocation. Joint spaces appear intact. No erosive change.  IMPRESSION: No abnormality noted.  Electronically Signed   By: Lowella Grip M.D.   On: 10/25/2013 09:04    Assessment and Plan: 43 y.o. male with foot contusion. Given patient's antalgic gait and degree of pain we'll use a Cam Walker boot with NSAIDs for pain control. Followup with primary care provider  Discussed warning signs or symptoms. Please see discharge instructions. Patient expresses understanding.    Gregor Hams, MD 10/25/13 [phone]

## 2013-10-25 NOTE — ED Notes (Signed)
Pt c/o inj to right foot onset last night Reports he was tangled up by dog leash and he hit to of his foot against porch Pain increases when bearing wt Slow gait; alert w/no signs of acute distress.

## 2013-11-16 ENCOUNTER — Telehealth: Payer: Self-pay | Admitting: *Deleted

## 2013-11-16 NOTE — Telephone Encounter (Signed)
Pt return call back he stated his new insurance doesn't start until July 1st. Wanting to know if he can pick samples up for Januvia. Inform pt will leave at front desk for him to pick-up...Raechel Chute/lmb

## 2013-11-16 NOTE — Telephone Encounter (Signed)
Left msg on triage needing to speak with nurse about his diabetes medication. Called pt back no answer LMOM RTC...Raechel Chute/lmb

## 2013-12-22 ENCOUNTER — Other Ambulatory Visit: Payer: Self-pay | Admitting: Internal Medicine

## 2014-01-23 ENCOUNTER — Other Ambulatory Visit: Payer: Self-pay

## 2014-01-23 MED ORDER — METFORMIN HCL ER 500 MG PO TB24: ORAL_TABLET | ORAL | Status: DC

## 2014-02-03 ENCOUNTER — Encounter: Payer: 59 | Attending: Internal Medicine

## 2014-02-03 DIAGNOSIS — Z713 Dietary counseling and surveillance: Secondary | ICD-10-CM | POA: Diagnosis not present

## 2014-02-03 DIAGNOSIS — E119 Type 2 diabetes mellitus without complications: Secondary | ICD-10-CM | POA: Diagnosis present

## 2014-02-18 NOTE — Progress Notes (Signed)
Patient was seen on 02/03/14 for the complete diabetes self-management series at the [facility]. This is a part of the Link to [employer].  Handouts given during class include:  Living Well with Diabetes book  Carb Counting and Meal Planning book  Meal Plan Card  Carbohydrate guide  Meal planning worksheet  Low Sodium Flavoring Tips  The diabetes portion plate  Low Carbohydrate Snack Suggestions  A1c to eAG Conversion Chart  Diabetes Medications  Stress Management  Diabetes Recommended Care Schedule  Diabetes Success Plan  Core Class Satisfaction Survey  The following learning objectives were met by the patient during this course:  Describe diabetes  State some common risk factors for diabetes  Defines the role of glucose and insulin  Identifies type of diabetes and pathophysiology  Describe the relationship between diabetes and cardiovascular risk  State the members of the Healthcare Team  States the rationale for glucose monitoring  State when to test glucose  State their individual Target Range  State the importance of logging glucose readings  Describe how to interpret glucose readings  Identifies A1C target  Explain the correlation between A1c and eAG values  State symptoms and treatment of high blood glucose  State symptoms and treatment of low blood glucose  Explain proper technique for glucose testing  Identifies proper sharps disposal  Describe the role of different macronutrients on glucose  Explain how carbohydrates affect blood glucose  State what foods contain the most carbohydrates  Demonstrate carbohydrate counting  Demonstrate how to read Nutrition Facts food label  Describe effects of various fats on heart health  Describe the importance of good nutrition for health and healthy eating strategies  Describe techniques for managing your shopping, cooking and meal planning  List  strategies to follow meal plan when dining out  Describe the effects of alcohol on glucose and how to use it safely   State the amount of activity recommended for healthy living   Describe activities suitable for individual needs   Identify ways to regularly incorporate activity into daily life   Identify barriers to activity and ways to over come these barriers  Identify diabetes medications being personally used and their primary action for lowering glucose and possible side effects   Describe role of stress on blood glucose and develop strategies to address psychosocial issues   Identify diabetes complications and ways to prevent them  Explain how to manage diabetes during illness   Evaluate success in meeting personal goal   Establish 2-3 goals that they will plan to diligently work on until they return for the  36-monthfollow-up visit  [facility]: [facility]    I will count my carb choices at most meals and snacks  I will be active 60 minutes or more 4 times a week  I will take my diabetes medications as scheduled  I will test my glucose at least 1 times a day, 7 days a week  To help manage stress I will exercise at least 6 times a week  Potential barriers : Stress, Too active to plan proper meals Support Plan: Family support, Link to [employer]

## 2014-03-30 ENCOUNTER — Encounter: Payer: 59 | Admitting: Internal Medicine

## 2014-04-18 ENCOUNTER — Ambulatory Visit (INDEPENDENT_AMBULATORY_CARE_PROVIDER_SITE_OTHER): Payer: 59 | Admitting: Internal Medicine

## 2014-04-18 ENCOUNTER — Encounter: Payer: Self-pay | Admitting: Internal Medicine

## 2014-04-18 ENCOUNTER — Other Ambulatory Visit (INDEPENDENT_AMBULATORY_CARE_PROVIDER_SITE_OTHER): Payer: 59

## 2014-04-18 VITALS — BP 122/82 | HR 87 | Temp 98.3°F | Ht 71.0 in | Wt 241.2 lb

## 2014-04-18 DIAGNOSIS — Z0000 Encounter for general adult medical examination without abnormal findings: Secondary | ICD-10-CM

## 2014-04-18 DIAGNOSIS — E119 Type 2 diabetes mellitus without complications: Secondary | ICD-10-CM

## 2014-04-18 LAB — BASIC METABOLIC PANEL
BUN: 17 mg/dL (ref 6–23)
CALCIUM: 9.3 mg/dL (ref 8.4–10.5)
CHLORIDE: 102 meq/L (ref 96–112)
CO2: 27 meq/L (ref 19–32)
CREATININE: 1.3 mg/dL (ref 0.4–1.5)
GFR: 64.37 mL/min (ref >60.00)
Glucose, Bld: 106 mg/dL — ABNORMAL HIGH (ref 70–99)
Potassium: 3.8 mEq/L (ref 3.5–5.1)
Sodium: 138 mEq/L (ref 135–145)

## 2014-04-18 LAB — CBC WITH DIFFERENTIAL/PLATELET
Basophils Absolute: 0 10*3/uL (ref 0.0–0.1)
Basophils Relative: 0.4 % (ref 0.0–3.0)
EOS PCT: 2.4 % (ref 0.0–5.0)
Eosinophils Absolute: 0.2 10*3/uL (ref 0.0–0.7)
HCT: 47.5 % (ref 39.0–52.0)
Hemoglobin: 16 g/dL (ref 13.0–17.0)
Lymphocytes Relative: 26.5 % (ref 12.0–46.0)
Lymphs Abs: 2.4 10*3/uL (ref 0.7–4.0)
MCHC: 33.7 g/dL (ref 30.0–36.0)
MCV: 89.9 fl (ref 78.0–100.0)
MONOS PCT: 7.7 % (ref 3.0–12.0)
Monocytes Absolute: 0.7 10*3/uL (ref 0.1–1.0)
NEUTROS ABS: 5.6 10*3/uL (ref 1.4–7.7)
Neutrophils Relative %: 63 % (ref 43.0–77.0)
Platelets: 214 10*3/uL (ref 150.0–400.0)
RBC: 5.28 Mil/uL (ref 4.22–5.81)
RDW: 13.8 % (ref 11.5–15.5)
WBC: 8.9 10*3/uL (ref 4.0–10.5)

## 2014-04-18 LAB — URINALYSIS, ROUTINE W REFLEX MICROSCOPIC
Bilirubin Urine: NEGATIVE
Ketones, ur: NEGATIVE
Leukocytes, UA: NEGATIVE
NITRITE: NEGATIVE
PH: 5.5 (ref 5.0–8.0)
TOTAL PROTEIN, URINE-UPE24: NEGATIVE
URINE GLUCOSE: NEGATIVE
Urobilinogen, UA: 0.2 (ref 0.0–1.0)

## 2014-04-18 LAB — LIPID PANEL
CHOLESTEROL: 125 mg/dL (ref 0–200)
HDL: 32.2 mg/dL — AB (ref >39.00)
NonHDL: 92.8
Total CHOL/HDL Ratio: 4
Triglycerides: 203 mg/dL — ABNORMAL HIGH (ref 0.0–149.0)
VLDL: 40.6 mg/dL — ABNORMAL HIGH (ref 0.0–40.0)

## 2014-04-18 LAB — MICROALBUMIN / CREATININE URINE RATIO
CREATININE, U: 225.7 mg/dL
MICROALB/CREAT RATIO: 0.4 mg/g (ref 0.0–30.0)
Microalb, Ur: 0.8 mg/dL (ref 0.0–1.9)

## 2014-04-18 LAB — HEPATIC FUNCTION PANEL
ALT: 27 U/L (ref 0–53)
AST: 20 U/L (ref 0–37)
Albumin: 4.3 g/dL (ref 3.5–5.2)
Alkaline Phosphatase: 56 U/L (ref 39–117)
Bilirubin, Direct: 0.1 mg/dL (ref 0.0–0.3)
TOTAL PROTEIN: 6.7 g/dL (ref 6.0–8.3)
Total Bilirubin: 0.8 mg/dL (ref 0.2–1.2)

## 2014-04-18 LAB — HEMOGLOBIN A1C: Hgb A1c MFr Bld: 6.9 % — ABNORMAL HIGH (ref 4.6–6.5)

## 2014-04-18 LAB — TSH: TSH: 2.55 u[IU]/mL (ref 0.35–4.50)

## 2014-04-18 LAB — PSA: PSA: 0.57 ng/mL (ref 0.10–4.00)

## 2014-04-18 MED ORDER — LISINOPRIL 5 MG PO TABS: 5.0000 mg | ORAL_TABLET | Freq: Every day | ORAL | Status: DC

## 2014-04-18 NOTE — Progress Notes (Signed)
Subjective:    Patient ID: [name], male    DOB: 1970/08/05, 43 y.o.   MRN: [id number]  HPI  Here for wellness and f/u;  Overall doing ok;  Pt denies CP, worsening SOB, DOE, wheezing, orthopnea, PND, worsening LE edema, palpitations, dizziness or syncope.  Pt denies neurological change such as new headache, facial or extremity weakness.  Pt denies polydipsia, polyuria, or low sugar symptoms. Pt states overall good compliance with treatment and medications, good tolerability, and has been trying to follow lower cholesterol diet.  Pt denies worsening depressive symptoms, suicidal ideation or panic. No fever, night sweats, wt loss, loss of appetite, or other constitutional symptoms.  Pt states good ability with ADL's, has low fall risk, home safety reviewed and adequate, no other significant changes in hearing or vision, and only occasionally active with exercise.  Has lost 5 lbs per pt since last visit.  CBG' 108- 140 in AM .  Working on better diet in last 6 mo with avoiding fast food. Forgotten to take the lisinopril 5 mg after last visit but now ok to start. Past Medical History  Diagnosis Date  . ALLERGIC RHINITIS 12/31/2008  . DIABETES MELLITUS, TYPE II 06/03/2009  . FOOT PAIN, RIGHT 08/08/2010  . HYPERSOMNIA 11/30/2008  . OBSTRUCTIVE SLEEP APNEA 02/03/2009  . OTHER INFLAMMATORY DISORDER MALE GENITAL ORGANS 11/30/2008  . Lumbar disc disease 03/01/2011   Past Surgical History  Procedure Laterality Date  . Hernia repair  2012    [facility]    reports that he has never smoked. He does not have any smokeless tobacco history on file. He reports that he drinks alcohol. He reports that he does not use illicit drugs. family history includes Cancer in his other; Coronary artery disease in his father; Depression in his maternal grandmother and mother; Diabetes in his father; Heart attack in his father; Hypertension in his father. Allergies  Allergen Reactions  . Penicillins    Current Outpatient  Prescriptions on File Prior to Visit  Medication Sig Dispense Refill  . Alcohol Swabs (B-D SINGLE USE SWABS REGULAR) PADS Use as directed    . aspirin 81 MG tablet Take 81 mg by mouth daily.      . Blood Glucose Monitoring Suppl ([place]) W/DEVICE KIT Use as directed     . cyclobenzaprine (FLEXERIL) 5 MG tablet Take 1 tablet (5 mg total) by mouth 3 (three) times daily as needed for muscle spasms. 60 tablet 2  . glucose blood (ONE TOUCH TEST STRIPS) test strip Use as directed 100 each 6  . JANUVIA 50 MG tablet TAKE 1 TABLET BY MOUTH ONCE DAILY 90 tablet 3  . lisinopril (PRINIVIL,ZESTRIL) 5 MG tablet Take 1 tablet (5 mg total) by mouth daily. 90 tablet 3  . metFORMIN (GLUCOPHAGE-XR) 500 MG 24 hr tablet Take 2 by mouth 2 times a day 360 tablet 3  . naproxen (NAPROSYN) 500 MG tablet Take 1 tablet (500 mg total) by mouth 2 (two) times daily with a meal. 60 tablet 4  . NON FORMULARY CPAP + 11 CM, full face mask     . ONE TOUCH LANCETS MISC Use as directed once daily to check blood sugar.  Diagnosis code 250.00 100 each 6  . ONE TOUCH ULTRA TEST test strip USE AS DIRECTED ONCE DAILY TO CHECK BLOOD SUGAR 100 each 11  . ONETOUCH DELICA LANCETS 74F MISC USE AS DIRECTED 100 each 8  . ONETOUCH DELICA LANCETS MISC USE AS DIRECTED  100 each 11  . sitaGLIPtin (JANUVIA) 100 MG tablet Take 1 tablet (100 mg total) by mouth daily. 90 tablet 3   No current facility-administered medications on file prior to visit.     Review of Systems Constitutional: Negative for increased diaphoresis, other activity, appetite or other siginficant weight change  HENT: Negative for worsening hearing loss, ear pain, facial swelling, mouth sores and neck stiffness.   Eyes: Negative for other worsening pain, redness or visual disturbance.  Respiratory: Negative for shortness of breath and wheezing.   Cardiovascular: Negative for chest pain and palpitations.  Gastrointestinal: Negative for diarrhea, blood in stool,  abdominal distention or other pain Genitourinary: Negative for hematuria, flank pain or change in urine volume.  Musculoskeletal: Negative for myalgias or other joint complaints.  Skin: Negative for color change and wound.  Neurological: Negative for syncope and numbness. other than noted Hematological: Negative for adenopathy. or other swelling Psychiatric/Behavioral: Negative for hallucinations, self-injury, decreased concentration or other worsening agitation.      Objective:   Physical Exam BP 122/82 mmHg  Pulse 87  Temp(Src) 98.3 F (36.8 C) (Oral)  Ht '5\' 11"'  (1.803 m)  Wt 241 lb 4 oz (109.43 kg)  BMI 33.66 kg/m2  SpO2 96% VS noted,  Constitutional: Pt is oriented to person, place, and time. Appears well-developed and well-nourished.  Head: Normocephalic and atraumatic.  Right Ear: External ear normal.  Left Ear: External ear normal.  Nose: Nose normal.  Mouth/Throat: Oropharynx is clear and moist.  Eyes: Conjunctivae and EOM are normal. Pupils are equal, round, and reactive to light.  Neck: Normal range of motion. Neck supple. No JVD present. No tracheal deviation present.  Cardiovascular: Normal rate, regular rhythm, normal heart sounds and intact distal pulses.   Pulmonary/Chest: Effort normal and breath sounds without rales or wheezing  Abdominal: Soft. Bowel sounds are normal. NT. No HSM  Musculoskeletal: Normal range of motion. Exhibits no edema.  Lymphadenopathy:  Has no cervical adenopathy.  Neurological: Pt is alert and oriented to person, place, and time. Pt has normal reflexes. No cranial nerve deficit. Motor grossly intact Skin: Skin is warm and dry. No rash noted.  Psychiatric:  Has normal mood and affect. Behavior is normal.   Wt Readings from Last 3 Encounters:  04/18/14 241 lb 4 oz (109.43 kg)  09/22/13 242 lb 6 oz (109.941 kg)  03/09/13 241 lb 8 oz (109.544 kg)       Assessment & Plan:

## 2014-04-18 NOTE — Assessment & Plan Note (Signed)

## 2014-04-18 NOTE — Patient Instructions (Addendum)
You had the flu shot today  Please take all new medication as prescribed - the lisinopril 5 mg per day  Please continue all other medications as before, and refills have been done if requested.  Please have the pharmacy call with any other refills you may need.  Please continue your efforts at being more active, low cholesterol diet, and weight control.  You are otherwise up to date with prevention measures today.  Please keep your appointments with your specialists as you may have planned  Please return in 6 months, or sooner if needed, with Lab testing done 3-5 days before

## 2014-04-18 NOTE — Assessment & Plan Note (Signed)
stable overall by history and exam, recent data reviewed with pt, and pt to continue medical treatment as before,  to f/u any worsening symptoms or concerns Lab Results  Component Value Date   HGBA1C 6.7* 09/22/2013   For f/u lab

## 2014-04-18 NOTE — Progress Notes (Signed)
Pre visit review using our clinic review tool, if applicable. No additional management support is needed unless otherwise documented below in the visit note. 

## 2014-04-20 LAB — LDL CHOLESTEROL, DIRECT: Direct LDL: 61.6 mg/dL

## 2014-04-24 ENCOUNTER — Encounter: Payer: Self-pay | Admitting: Internal Medicine

## 2014-08-21 ENCOUNTER — Other Ambulatory Visit: Payer: Self-pay

## 2014-08-21 MED ORDER — METFORMIN HCL ER 500 MG PO TB24: ORAL_TABLET | ORAL | Status: DC

## 2014-10-19 ENCOUNTER — Ambulatory Visit: Payer: 59 | Admitting: Internal Medicine

## 2014-10-31 ENCOUNTER — Encounter: Payer: Self-pay | Admitting: Internal Medicine

## 2014-10-31 ENCOUNTER — Other Ambulatory Visit (INDEPENDENT_AMBULATORY_CARE_PROVIDER_SITE_OTHER): Payer: 59

## 2014-10-31 ENCOUNTER — Ambulatory Visit (INDEPENDENT_AMBULATORY_CARE_PROVIDER_SITE_OTHER): Payer: 59 | Admitting: Internal Medicine

## 2014-10-31 VITALS — BP 110/76 | HR 77 | Temp 98.1°F | Ht 72.0 in | Wt 236.0 lb

## 2014-10-31 DIAGNOSIS — E119 Type 2 diabetes mellitus without complications: Secondary | ICD-10-CM | POA: Diagnosis not present

## 2014-10-31 DIAGNOSIS — Z0000 Encounter for general adult medical examination without abnormal findings: Secondary | ICD-10-CM

## 2014-10-31 DIAGNOSIS — E669 Obesity, unspecified: Secondary | ICD-10-CM | POA: Insufficient documentation

## 2014-10-31 DIAGNOSIS — E785 Hyperlipidemia, unspecified: Secondary | ICD-10-CM

## 2014-10-31 HISTORY — DX: Hyperlipidemia, unspecified: E78.5

## 2014-10-31 LAB — BASIC METABOLIC PANEL
BUN: 15 mg/dL (ref 6–23)
CO2: 29 mEq/L (ref 19–32)
CREATININE: 1.15 mg/dL (ref 0.40–1.50)
Calcium: 9.4 mg/dL (ref 8.4–10.5)
Chloride: 103 mEq/L (ref 96–112)
GFR: 73.31 mL/min (ref >60.00)
GLUCOSE: 161 mg/dL — AB (ref 70–99)
POTASSIUM: 4.8 meq/L (ref 3.5–5.1)
Sodium: 137 mEq/L (ref 135–145)

## 2014-10-31 LAB — HEPATIC FUNCTION PANEL
ALBUMIN: 4.3 g/dL (ref 3.5–5.2)
ALK PHOS: 66 U/L (ref 39–117)
ALT: 26 U/L (ref 0–53)
AST: 19 U/L (ref 0–37)
Bilirubin, Direct: 0.2 mg/dL (ref 0.0–0.3)
Total Bilirubin: 0.7 mg/dL (ref 0.2–1.2)
Total Protein: 6.8 g/dL (ref 6.0–8.3)

## 2014-10-31 LAB — LIPID PANEL
CHOL/HDL RATIO: 3
CHOLESTEROL: 130 mg/dL (ref 0–200)
HDL: 37.9 mg/dL — ABNORMAL LOW (ref >39.00)
LDL Cholesterol: 59 mg/dL (ref 0–99)
NONHDL: 92.1
Triglycerides: 165 mg/dL — ABNORMAL HIGH (ref 0.0–149.0)
VLDL: 33 mg/dL (ref 0.0–40.0)

## 2014-10-31 LAB — HEMOGLOBIN A1C: Hgb A1c MFr Bld: 7 % — ABNORMAL HIGH (ref 4.6–6.5)

## 2014-10-31 NOTE — Patient Instructions (Signed)

## 2014-10-31 NOTE — Progress Notes (Signed)
Pre visit review using our clinic review tool, if applicable. No additional management support is needed unless otherwise documented below in the visit note. 

## 2014-10-31 NOTE — Assessment & Plan Note (Signed)
With good LDL, but mild elev TG and low HDL - for low fat diet, more exercise,  to f/u any worsening symptoms or concerns

## 2014-10-31 NOTE — Progress Notes (Signed)
Subjective:    Patient ID: [name], male    DOB: Feb 16, 1971, 44 y.o.   MRN: [id number]  HPI  Here to f/u; overall doing ok,  Pt denies chest pain, increasing sob or doe, wheezing, orthopnea, PND, increased LE swelling, palpitations, dizziness or syncope.  Pt denies new neurological symptoms such as new headache, or facial or extremity weakness or numbness.  Pt denies polydipsia, polyuria, or low sugar episode.   Pt denies new neurological symptoms such as new headache, or facial or extremity weakness or numbness.   Pt states overall good compliance with meds, mostly trying to follow appropriate diet, with wt overall down - Cont's to lose wt slowly. Peak wt approx 270, now 236, goal 210.  Wt Readings from Last 3 Encounters:  10/31/14 236 lb (107.049 kg)  04/18/14 241 lb 4 oz (109.43 kg)  09/22/13 242 lb 6 oz (109.941 kg)  Now working out 2-3 days per wk, trying to do more if possible.   Past Medical History  Diagnosis Date  . ALLERGIC RHINITIS 12/31/2008  . DIABETES MELLITUS, TYPE II 06/03/2009  . FOOT PAIN, RIGHT 08/08/2010  . HYPERSOMNIA 11/30/2008  . OBSTRUCTIVE SLEEP APNEA 02/03/2009  . OTHER INFLAMMATORY DISORDER MALE GENITAL ORGANS 11/30/2008  . Lumbar disc disease 03/01/2011  . Dyslipidemia 10/31/2014   Past Surgical History  Procedure Laterality Date  . Hernia repair  2012    [facility]    reports that he has never smoked. He does not have any smokeless tobacco history on file. He reports that he drinks alcohol. He reports that he does not use illicit drugs. family history includes Cancer in his other; Coronary artery disease in his father; Depression in his maternal grandmother and mother; Diabetes in his father; Heart attack in his father; Hypertension in his father. Allergies  Allergen Reactions  . Penicillins    Current Outpatient Prescriptions on File Prior to Visit  Medication Sig Dispense Refill  . Alcohol Swabs (B-D SINGLE USE SWABS REGULAR) PADS Use as directed    .  aspirin 81 MG tablet Take 81 mg by mouth daily.      . Blood Glucose Monitoring Suppl ([place]) W/DEVICE KIT Use as directed     . JANUVIA 50 MG tablet TAKE 1 TABLET BY MOUTH ONCE DAILY 90 tablet 3  . lisinopril (PRINIVIL,ZESTRIL) 5 MG tablet Take 1 tablet (5 mg total) by mouth daily. 90 tablet 3  . metFORMIN (GLUCOPHAGE-XR) 500 MG 24 hr tablet Take 1 by mouth once a day. 90 tablet 3  . NON FORMULARY CPAP + 11 CM, full face mask     . ONE TOUCH ULTRA TEST test strip USE AS DIRECTED ONCE DAILY TO CHECK BLOOD SUGAR 100 each 11  . ONETOUCH DELICA LANCETS 27O MISC USE AS DIRECTED 100 each 8  . glucose blood (ONE TOUCH TEST STRIPS) test strip Use as directed (Patient not taking: Reported on 10/31/2014) 100 each 6  . naproxen (NAPROSYN) 500 MG tablet Take 1 tablet (500 mg total) by mouth 2 (two) times daily with a meal. (Patient not taking: Reported on 10/31/2014) 60 tablet 4  . ONE TOUCH LANCETS MISC Use as directed once daily to check blood sugar.  Diagnosis code 250.00 (Patient not taking: Reported on 10/31/2014) 100 each 6  . ONETOUCH DELICA LANCETS MISC USE AS DIRECTED (Patient not taking: Reported on 10/31/2014) 100 each 11  . sitaGLIPtin (JANUVIA) 100 MG tablet Take 1 tablet (100 mg total) by mouth daily. (  Patient not taking: Reported on 10/31/2014) 90 tablet 3   No current facility-administered medications on file prior to visit.    Review of Systems  Constitutional: Negative for unusual diaphoresis or night sweats HENT: Negative for ringing in ear or discharge Eyes: Negative for double vision or worsening visual disturbance.  Respiratory: Negative for choking and stridor.   Gastrointestinal: Negative for vomiting or other signifcant bowel change Genitourinary: Negative for hematuria or change in urine volume.  Musculoskeletal: Negative for other MSK pain or swelling Skin: Negative for color change and worsening wound.  Neurological: Negative for tremors and numbness other than noted   Psychiatric/Behavioral: Negative for decreased concentration or agitation other than above       Objective:   Physical Exam BP 110/76 mmHg  Pulse 77  Temp(Src) 98.1 F (36.7 C) (Oral)  Ht 6' (1.829 m)  Wt 236 lb (107.049 kg)  BMI 32.00 kg/m2  SpO2 97% VS noted,  Constitutional: Pt appears in no significant distress HENT: Head: NCAT.  Right Ear: External ear normal.  Left Ear: External ear normal.  Eyes: . Pupils are equal, round, and reactive to light. Conjunctivae and EOM are normal Neck: Normal range of motion. Neck supple.  Cardiovascular: Normal rate and regular rhythm.   Pulmonary/Chest: Effort normal and breath sounds without rales or wheezing.  Abd:  Soft, NT, ND, + BS Neurological: Pt is alert. Not confused , motor grossly intact Skin: Skin is warm. No rash, no LE edema Psychiatric: Pt behavior is normal. No agitation.     Assessment & Plan:

## 2014-10-31 NOTE — Assessment & Plan Note (Signed)
stable overall by history and exam, recent data reviewed with pt, and pt to continue medical treatment as before,  to f/u any worsening symptoms or concerns Lab Results  Component Value Date   HGBA1C 6.9* 04/18/2014   For cont'd diet, med no change, for labs today, f/u labs next visit

## 2014-10-31 NOTE — Assessment & Plan Note (Signed)
For now with goal 210, needs increased exercise, cont reduced calories,  to f/u any worsening symptoms or concerns

## 2014-11-14 ENCOUNTER — Ambulatory Visit: Payer: 59 | Admitting: Pharmacist

## 2014-11-14 ENCOUNTER — Ambulatory Visit (INDEPENDENT_AMBULATORY_CARE_PROVIDER_SITE_OTHER): Payer: Self-pay | Admitting: Family Medicine

## 2014-11-14 ENCOUNTER — Encounter: Payer: Self-pay | Admitting: Pharmacist

## 2014-11-14 ENCOUNTER — Other Ambulatory Visit: Payer: Self-pay | Admitting: Internal Medicine

## 2014-11-14 VITALS — BP 100/68 | Ht 71.0 in | Wt 234.0 lb

## 2014-11-14 DIAGNOSIS — E119 Type 2 diabetes mellitus without complications: Secondary | ICD-10-CM

## 2014-11-14 NOTE — Patient Instructions (Addendum)
1)  Continue to exercise regularly 2)  Attempt to reduce fast food and increase cooking at home 3)  Consider finding a dentist closer to work in [employer] 4)  Schedule an eye exam 5)  Continue monitoring at least daily 6)  Follow-up with Melissa in September  Great to see you today!

## 2014-11-14 NOTE — Progress Notes (Signed)
Subjective:  Patient is a 44 yo male with type 2 diabetes who presents today for 3 month follow-up as part of the employer-sponsored Link to Wellness program.  Pt was enrolled in LTW in 2013 and discontinued due to loss of [employer] insurance plan.  He is now re-enrolling and here for annual pharmacy consult.  Current diabetes regimen includes Metformin and Januvia. Patient is not currently on ASA, ACEi, and statin.  ASA was recommended by MD due to family history of early age cardiovascular disease; however, pt does not wish to take it at this time.  ACEi was prescribed but pt recently discontinued due to symptoms of hypotension including lightheadedness and dizziness.  I will notify MD of these changes.  Most recent MD follow-up was June 2016. Patient has a pending appt for 6 mo follow-up in Dec 2016. No major health changes at this time.  Of note, patient has discontinued using CPAP as he does not feel he needs it after weight loss.    Diabetes Assessment:  No changes to diabetes regimen. Patient reports medication compliance. Most recent A1c was 7.0% which is exceeding goal of less than 7%.  Weight has decreased by 6 lb since last visit.  Over the past two years, pt has lost 20 lbs. MD would like patient to reduce weight by an additional 30 lbs in order to better control DM and potentially discontinue diabetes medications.  Patient did bring meter today and is currently testing once per day, either fasting or post prandial.  Readings are generally 150s but as high as 180s.  Hypoglycemia is rare, since reducing januvia to 50 mg daily. Patient is not up to date on eye or dental exam.  Eye exam was last done approximately two years ago.  Dental exam is overdue, pt needs a new dentist closer to his work for convenience.  He will schedule appointments for these visits.       Lifestyle Assessment:  Diet - Patient admits diet has been less than optimal recently due to busy schedule.  He and his family have been  eating out fast food more often or cooking more quick meals.  He does report choosing healthier lunch options such as grilled chicken, portion sizes have been reduced, small value meal vs upsize, less chinese food, less pasta, more vegetables.  Patient has also eliminated sugary beverages, mostly water, occasional diet drinks.  He is no longer eating biscuits for breakfast, now eating cereal such as cheerios.  Exercise - workout on regular basis, gym 3 times weekly, usually 60 min, cardio and weights.   Plan and Goals: 1)  Continue to exercise regularly 2)  Attempt to reduce fast food and increase cooking at home 3)  Consider finding a dentist closer to work in [employer] 4)  Schedule an eye exam 5)  Continue monitoring at least daily 6)  Follow-up with Melissa in September   Orlin Hilding, PharmD Link to [facility]  [phone]

## 2014-11-27 NOTE — Progress Notes (Signed)
Patient ID: [name], male   DOB: 11/26/1970, 44 y.o.   MRN: [id number] ATTENDING PHYSICIAN NOTE: I have reviewed the chart and agree with the plan as detailed above. Denny LevySara Jaylynne Birkhead MD Pager [phone]

## 2015-02-07 ENCOUNTER — Other Ambulatory Visit: Payer: Self-pay

## 2015-02-07 VITALS — BP 120/78 | HR 78 | Resp 16 | Ht 71.0 in | Wt 236.0 lb

## 2015-02-07 DIAGNOSIS — E119 Type 2 diabetes mellitus without complications: Secondary | ICD-10-CM

## 2015-02-07 NOTE — Patient Outreach (Signed)
[name]) Care Management   02/07/2015  [name] 02-May-1971 [id number]  [name] is an 44 y.o. male.   Member seen for follow up office visit for Link to Wellness program for self management of Type 2 diabetes  Subjective: Member states that he has not been taking care of himself very well for the last 3 weeks.  States that his Father died 3 wks ago and he has been dealing with his death and helping his Mother.  States that his blood sugars have been up and he has not been watching his diet or exercising since then.  States he does not feel depressed but he is grieving for his Father.  States he plans to join the [employer] near his home soon.  Objective:   Review of Systems  All other systems reviewed and are negative. Reviewed glucometer 7 day average-159  14 day average-169 30 day average-159  Physical Exam  Today's Vitals   02/07/15 1629  BP: 120/78  Pulse: 78  Resp: 16  Height: 1.803 m (_0 )  Weight: 236 lb (107.049 kg)  SpO2: 95%  PainSc: 0-No pain    Current Medications:   Current Outpatient Prescriptions  Medication Sig Dispense Refill  . Alcohol Swabs (B-D SINGLE USE SWABS REGULAR) PADS Use as directed    . Blood Glucose Monitoring Suppl (TRUERESULT BLOOD GLUCOSE) W/DEVICE KIT by Does not apply route.    Marland Kitchen JANUVIA 50 MG tablet TAKE 1 TABLET BY MOUTH ONCE DAILY 90 tablet 3  . Lancet Devices MISC by Does not apply route.    . metFORMIN (GLUCOPHAGE-XR) 500 MG 24 hr tablet Take 1 by mouth once a day. 90 tablet 3  . TRUEPLUS LANCETS 30G MISC USE AS DIRECTED ONCE DAILY 100 each 0  . TRUETEST TEST test strip USE AS DIRECTED ONCE DAILY 100 each 0  . aspirin 81 MG tablet Take 81 mg by mouth daily.      Marland Kitchen lisinopril (PRINIVIL,ZESTRIL) 5 MG tablet Take 1 tablet (5 mg total) by mouth daily. (Patient not taking: Reported on 11/14/2014) 90 tablet 3  .       No current facility-administered medications for this visit.    Functional Status:   In your  present state of health, do you have any difficulty performing the following activities: 02/07/2015 11/14/2014  Hearing? N N  Vision? N N  Difficulty concentrating or making decisions? N N  Walking or climbing stairs? N N  Dressing or bathing? N N  Doing errands, shopping? N N    Fall/Depression Screening:    PHQ 2/9 Scores 02/07/2015 04/18/2014 02/18/2014 09/22/2013 03/09/2013  PHQ - 2 Score 0 0 0 0 0    Assessment:   Member seen for follow up office visit for Link to Wellness program for self management of Type 2 diabetes.  Member had been near goal of hemoglobin A1C of less than 7 with last result 7.  Member has had higher CBGs after his Father's death and he reports he has not been watching his diet during this period.  He has not been exercising regularly for the last few weeks but plans to join the [facility].  Member reports taking his medications daily with no difficulties.  Plan:  Plan to check blood sugar every day: fasting and 1 -2 hours after eating with goals of 80-130 and less than 180 after eating. Plan to  join [facility] and exercise 3 times a week for 1 hour Plan to eat  breakfast every day Plan to complete EMMI programs by 04/09/15 Plan to see Dr. Jenny Reichmann 05/02/15 Plan to return to Link to Wellness on 06/06/15 at 4 PM  [facility] CM Care Plan Problem One        Most Recent Value   Care Plan Problem One  Elevated blood sugars related to dx of type 2 DM    Role Documenting the Problem One  Care Management Coordinator   Care Plan for Problem One  Active   [facility] Long Term Goal (31-90 days)  Member will decrease hemoglobin A1C to 7 or below in the next 90 days   [facility] Long Term Goal Start Date  02/07/15   Interventions for Problem One Long Term Goal  Discussed with member how grief and stress can effect his blood sugars, given information on the employee counseling assistance program and urged to call if he feels he is having difficulty coping with his grief, Reinforced CHO counting and protein control,  Reinforced to make wise choices when eating out, Reinforced importance of exercise to glycemic control and how it can also help with his stress      Peter Garter RN, [facility] Care Management Coordinator-Link to [place] [facility] [phone]

## 2015-02-08 NOTE — Patient Instructions (Signed)
1. Plan to check blood sugar every day: fasting and 1 -2 hours after eating with goals of 80-130 and less than 180 after eating. 2. Plan to  join [facility] and exercise 3 times a week for 1 hour 3. Plan to eat breakfast every day 4. Plan to complete EMMI programs by 04/09/15 5. Plan to see Dr. Jonny Ruiz 05/02/15 6. Plan to return to Link to Wellness on 06/06/15 at [facility]

## 2015-03-01 ENCOUNTER — Other Ambulatory Visit: Payer: Self-pay | Admitting: Internal Medicine

## 2015-05-02 ENCOUNTER — Ambulatory Visit: Payer: 59 | Admitting: Internal Medicine

## 2015-05-03 ENCOUNTER — Other Ambulatory Visit: Payer: Self-pay | Admitting: Internal Medicine

## 2015-05-15 ENCOUNTER — Ambulatory Visit: Payer: 59 | Admitting: Internal Medicine

## 2015-05-27 MED FILL — METFORMIN HCL ER 500 MG TAB: 500 | 90 days supply | Qty: 90 | Fill #2

## 2015-05-29 ENCOUNTER — Ambulatory Visit (INDEPENDENT_AMBULATORY_CARE_PROVIDER_SITE_OTHER): Payer: 59 | Admitting: Internal Medicine

## 2015-05-29 ENCOUNTER — Other Ambulatory Visit (INDEPENDENT_AMBULATORY_CARE_PROVIDER_SITE_OTHER): Payer: 59

## 2015-05-29 ENCOUNTER — Encounter: Payer: Self-pay | Admitting: Internal Medicine

## 2015-05-29 VITALS — BP 110/74 | HR 89 | Temp 97.8°F | Ht 71.0 in | Wt 240.0 lb

## 2015-05-29 DIAGNOSIS — E119 Type 2 diabetes mellitus without complications: Secondary | ICD-10-CM | POA: Diagnosis not present

## 2015-05-29 DIAGNOSIS — Z0000 Encounter for general adult medical examination without abnormal findings: Secondary | ICD-10-CM | POA: Diagnosis not present

## 2015-05-29 DIAGNOSIS — L989 Disorder of the skin and subcutaneous tissue, unspecified: Secondary | ICD-10-CM

## 2015-05-29 DIAGNOSIS — R7989 Other specified abnormal findings of blood chemistry: Secondary | ICD-10-CM

## 2015-05-29 DIAGNOSIS — Z23 Encounter for immunization: Secondary | ICD-10-CM

## 2015-05-29 DIAGNOSIS — L600 Ingrowing nail: Secondary | ICD-10-CM | POA: Diagnosis not present

## 2015-05-29 LAB — CBC WITH DIFFERENTIAL/PLATELET
BASOS PCT: 0.6 % (ref 0.0–3.0)
Basophils Absolute: 0.1 10*3/uL (ref 0.0–0.1)
EOS ABS: 0.3 10*3/uL (ref 0.0–0.7)
Eosinophils Relative: 2.6 % (ref 0.0–5.0)
HEMATOCRIT: 49.4 % (ref 39.0–52.0)
Hemoglobin: 16.6 g/dL (ref 13.0–17.0)
LYMPHS ABS: 3 10*3/uL (ref 0.7–4.0)
LYMPHS PCT: 29 % (ref 12.0–46.0)
MCHC: 33.7 g/dL (ref 30.0–36.0)
MCV: 89.1 fl (ref 78.0–100.0)
Monocytes Absolute: 0.8 10*3/uL (ref 0.1–1.0)
Monocytes Relative: 7.9 % (ref 3.0–12.0)
NEUTROS ABS: 6.1 10*3/uL (ref 1.4–7.7)
NEUTROS PCT: 59.9 % (ref 43.0–77.0)
PLATELETS: 241 10*3/uL (ref 150.0–400.0)
RBC: 5.55 Mil/uL (ref 4.22–5.81)
RDW: 13.7 % (ref 11.5–15.5)
WBC: 10.3 10*3/uL (ref 4.0–10.5)

## 2015-05-29 LAB — URINALYSIS, ROUTINE W REFLEX MICROSCOPIC
Bilirubin Urine: NEGATIVE
HGB URINE DIPSTICK: NEGATIVE
KETONES UR: NEGATIVE
LEUKOCYTES UA: NEGATIVE
Nitrite: NEGATIVE
Specific Gravity, Urine: >=1.03 — AB (ref 1.000–1.030)
TOTAL PROTEIN, URINE-UPE24: NEGATIVE
URINE GLUCOSE: 250 — AB
Urobilinogen, UA: 0.2 (ref 0.0–1.0)
pH: 5.5 (ref 5.0–8.0)

## 2015-05-29 LAB — MICROALBUMIN / CREATININE URINE RATIO
CREATININE, U: 169.5 mg/dL
Microalb Creat Ratio: 0.4 mg/g (ref 0.0–30.0)

## 2015-05-29 LAB — HEMOGLOBIN A1C: HEMOGLOBIN A1C: 7.4 % — AB (ref 4.6–6.5)

## 2015-05-29 MED ORDER — SITAGLIPTIN PHOSPHATE 50 MG PO TABS: 50.0000 mg | ORAL_TABLET | Freq: Every day | ORAL | Status: DC

## 2015-05-29 NOTE — Progress Notes (Signed)
Subjective:    Patient ID: [name], male    DOB: [birth year], 45 y.o.   MRN: [id number]  HPI  Here for wellness and f/u;  Overall doing ok;  Pt denies Chest pain, worsening SOB, DOE, wheezing, orthopnea, PND, worsening LE edema, palpitations, dizziness or syncope.  Pt denies neurological change such as new headache, facial or extremity weakness.  Pt denies polydipsia, polyuria, or low sugar symptoms. Pt states overall good compliance with treatment and medications, good tolerability, and has been trying to follow appropriate diet.  Pt denies worsening depressive symptoms, suicidal ideation or panic. No fever, night sweats, wt loss, loss of appetite, or other constitutional symptoms.  Pt states good ability with ADL's, has low fall risk, home safety reviewed and adequate, no other significant changes in hearing or vision, and only occasionally active with exercise.  Brotherin law and father died this past yr. Need podiatry for DM and left great toe ingrown nail.  Also with several skin lesions to neck, seem to be worsening, mild tender, wife concerned Past Medical History  Diagnosis Date  . ALLERGIC RHINITIS 12/31/2008  . DIABETES MELLITUS, TYPE II 06/03/2009  . FOOT PAIN, RIGHT 08/08/2010  . HYPERSOMNIA 11/30/2008  . OBSTRUCTIVE SLEEP APNEA 02/03/2009  . OTHER INFLAMMATORY DISORDER MALE GENITAL ORGANS 11/30/2008  . Lumbar disc disease 03/01/2011  . Dyslipidemia 10/31/2014   Past Surgical History  Procedure Laterality Date  . Hernia repair  2012    [facility]    reports that he has never smoked. He does not have any smokeless tobacco history on file. He reports that he drinks alcohol. He reports that he does not use illicit drugs. family history includes Cancer in his other; Coronary artery disease in his father; Depression in his maternal grandmother and mother; Diabetes in his father; Heart attack in his father; Hypertension in his father. Allergies  Allergen Reactions  . Penicillins     Childhood  allergy   Current Outpatient Prescriptions on File Prior to Visit  Medication Sig Dispense Refill  . Alcohol Swabs (B-D SINGLE USE SWABS REGULAR) PADS Use as directed    . aspirin 81 MG tablet Take 81 mg by mouth daily.      . Blood Glucose Monitoring Suppl (TRUERESULT BLOOD GLUCOSE) W/DEVICE KIT by Does not apply route.    Elmore Guise Devices MISC by Does not apply route.    . metFORMIN (GLUCOPHAGE-XR) 500 MG 24 hr tablet Take 1 by mouth once a day. 90 tablet 3  . NON FORMULARY CPAP + 11 CM, full face mask     . TRUEPLUS LANCETS 30G MISC USE AS DIRECTED ONCE DAILY 100 each 2  . TRUETEST TEST test strip USE AS DIRECTED ONCE DAILY 100 each 0  . lisinopril (PRINIVIL,ZESTRIL) 5 MG tablet Take 1 tablet (5 mg total) by mouth daily. (Patient not taking: Reported on 11/14/2014) 90 tablet 3   No current facility-administered medications on file prior to visit.    Review of Systems Constitutional: Negative for increased diaphoresis, other activity, appetite or siginficant weight change other than noted HENT: Negative for worsening hearing loss, ear pain, facial swelling, mouth sores and neck stiffness.   Eyes: Negative for other worsening pain, redness or visual disturbance.  Respiratory: Negative for shortness of breath and wheezing  Cardiovascular: Negative for chest pain and palpitations.  Gastrointestinal: Negative for diarrhea, blood in stool, abdominal distention or other pain Genitourinary: Negative for hematuria, flank pain or change in urine volume.  Musculoskeletal: Negative for  myalgias or other joint complaints.  Skin: Negative for color change and wound or drainage.  Neurological: Negative for syncope and numbness. other than noted Hematological: Negative for adenopathy. or other swelling Psychiatric/Behavioral: Negative for hallucinations, SI, self-injury, decreased concentration or other worsening agitation.      Objective:   Physical Exam BP 110/74 mmHg  Pulse 89  Temp(Src)  97.8 F (36.6 C) (Oral)  Ht 5' 11" (1.803 m)  Wt 240 lb (108.863 kg)  BMI 33.49 kg/m2  SpO2 98% VS noted,  Constitutional: Pt is oriented to person, place, and time. Appears well-developed and well-nourished, in no significant distress Head: Normocephalic and atraumatic.  Right Ear: External ear normal.  Left Ear: External ear normal.  Nose: Nose normal.  Mouth/Throat: Oropharynx is clear and moist.  Eyes: Conjunctivae and EOM are normal. Pupils are equal, round, and reactive to light.  Neck: Normal range of motion. Neck supple. No JVD present. No tracheal deviation present or significant neck LA or mass Cardiovascular: Normal rate, regular rhythm, normal heart sounds and intact distal pulses.   Pulmonary/Chest: Effort normal and breath sounds without rales or wheezing  Abdominal: Soft. Bowel sounds are normal. NT. No HSM  Musculoskeletal: Normal range of motion. Exhibits no edema.  Lymphadenopathy:  Has no cervical adenopathy.  Neurological: Pt is alert and oriented to person, place, and time. Pt has normal reflexes. No cranial nerve deficit. Motor grossly intact Skin: Skin is warm and dry. No rash noted. except for several slight raised firm slight erythem lesions to post and bilat neck, also left great toenail ingrown Psychiatric:  Has normal mood and affect. Behavior is normal.     Assessment & Plan:

## 2015-05-29 NOTE — Patient Instructions (Addendum)
You had the flu shot today  Please continue all other medications as before, and refills have been done if requested.  Please have the pharmacy call with any other refills you may need.  Please continue your efforts at being more active, low cholesterol diet, and weight control.  You are otherwise up to date with prevention measures today.  Please keep your appointments with your specialists as you may have planned  You will be contacted regarding the referral for: podiatry, and dermatology  Please go to the LAB in the Basement (turn left off the elevator) for the tests to be done today  You will be contacted by phone if any changes need to be made immediately.  Otherwise, you will receive a letter about your results with an explanation, but please check with MyChart first.  Please remember to sign up for MyChart if you have not done so, as this will be important to you in the future with finding out test results, communicating by private email, and scheduling acute appointments online when needed.  Please return in 6 months, or sooner if needed, with Lab testing done 3-5 days before

## 2015-05-29 NOTE — Progress Notes (Signed)
Pre visit review using our clinic review tool, if applicable. No additional management support is needed unless otherwise documented below in the visit note. 

## 2015-05-30 ENCOUNTER — Encounter: Payer: Self-pay | Admitting: Internal Medicine

## 2015-05-30 ENCOUNTER — Other Ambulatory Visit: Payer: Self-pay | Admitting: Internal Medicine

## 2015-05-30 DIAGNOSIS — L600 Ingrowing nail: Secondary | ICD-10-CM | POA: Insufficient documentation

## 2015-05-30 DIAGNOSIS — L989 Disorder of the skin and subcutaneous tissue, unspecified: Secondary | ICD-10-CM | POA: Insufficient documentation

## 2015-05-30 LAB — LIPID PANEL
Cholesterol: 137 mg/dL (ref 0–200)
HDL: 35.5 mg/dL — ABNORMAL LOW (ref >39.00)
NONHDL: 101.22
Total CHOL/HDL Ratio: 4
Triglycerides: 281 mg/dL — ABNORMAL HIGH (ref 0.0–149.0)
VLDL: 56.2 mg/dL — ABNORMAL HIGH (ref 0.0–40.0)

## 2015-05-30 LAB — BASIC METABOLIC PANEL
BUN: 18 mg/dL (ref 6–23)
CHLORIDE: 103 meq/L (ref 96–112)
CO2: 30 meq/L (ref 19–32)
CREATININE: 1.18 mg/dL (ref 0.40–1.50)
Calcium: 10 mg/dL (ref 8.4–10.5)
GFR: 70.98 mL/min (ref >60.00)
Glucose, Bld: 169 mg/dL — ABNORMAL HIGH (ref 70–99)
POTASSIUM: 4.4 meq/L (ref 3.5–5.1)
SODIUM: 142 meq/L (ref 135–145)

## 2015-05-30 LAB — PSA: PSA: 0.55 ng/mL (ref 0.10–4.00)

## 2015-05-30 LAB — TSH: TSH: 2.52 u[IU]/mL (ref 0.35–4.50)

## 2015-05-30 LAB — HEPATIC FUNCTION PANEL
ALK PHOS: 70 U/L (ref 39–117)
ALT: 29 U/L (ref 0–53)
AST: 19 U/L (ref 0–37)
Albumin: 4.4 g/dL (ref 3.5–5.2)
BILIRUBIN DIRECT: 0.1 mg/dL (ref 0.0–0.3)
BILIRUBIN TOTAL: 0.5 mg/dL (ref 0.2–1.2)
TOTAL PROTEIN: 6.8 g/dL (ref 6.0–8.3)

## 2015-05-30 LAB — LDL CHOLESTEROL, DIRECT: Direct LDL: 65 mg/dL

## 2015-05-30 MED ORDER — SITAGLIPTIN PHOSPHATE 100 MG PO TABS: 100.0000 mg | ORAL_TABLET | Freq: Every day | ORAL | Status: DC

## 2015-05-30 NOTE — Assessment & Plan Note (Signed)
Ok for podiatry referral 

## 2015-05-30 NOTE — Assessment & Plan Note (Signed)
Not likely malignant, but etiology unclear, for derm referral

## 2015-05-30 NOTE — Assessment & Plan Note (Signed)

## 2015-05-30 NOTE — Assessment & Plan Note (Signed)
stable overall by history and exam, recent data reviewed with pt, and pt to continue medical treatment as before,  to f/u any worsening symptoms or concerns Lab Results  Component Value Date   HGBA1C 7.0* 10/31/2014   For f/u labs

## 2015-05-31 MED FILL — JANUVIA 100 MG TABLET: 100 | 90 days supply | Qty: 90 | Fill #0

## 2015-06-03 ENCOUNTER — Telehealth: Payer: Self-pay | Admitting: Internal Medicine

## 2015-06-03 NOTE — Telephone Encounter (Signed)
Pt called you back regarding labs. Best call back number is [phone]

## 2015-06-03 NOTE — Telephone Encounter (Signed)
Called pt back and gave PCP results.

## 2015-06-04 ENCOUNTER — Other Ambulatory Visit: Payer: Self-pay

## 2015-06-04 VITALS — BP 108/72 | HR 82 | Resp 16 | Ht 71.0 in | Wt 238.6 lb

## 2015-06-04 DIAGNOSIS — E119 Type 2 diabetes mellitus without complications: Secondary | ICD-10-CM

## 2015-06-04 NOTE — Patient Outreach (Signed)
[place] [facility] [facility]) Care Management   06/04/2015  [name] 10-Sep-1970 [id number]  [name] is an 45 y.o. male  Member seen for follow up office visit for Link to Wellness program for self management of Type 2 diabetes  Subjective: Member states that he has not been doing what he needs to do to be healthy.  States that he has been very busy at work and has not been able to go to gym to exercise.  States his blood sugars have been up to the last month.  States he saw Dr. Jenny Reichmann last week and his hemoglobin A1C has gone up to 7.4.  States that he increased his Januvia to 100 mg.  States that he eats out for lunch every day.  States he would like to feel better and get healthier.  States he has been doing well with the stress of his Father's death.  Objective:   Review of Systems  All other systems reviewed and are negative. Reviewed glucometer 7 day average-207 14 day average-201 30 day average-188  Physical Exam  Today's Vitals   06/04/15 1434  BP: 108/72  Pulse: 82  Resp: 16  Height: 1.803 m ('5\' 11"' )  Weight: 238 lb 9.6 oz (108.228 kg)  SpO2: 95%  PainSc: 0-No pain    Current Medications:   Current Outpatient Prescriptions  Medication Sig Dispense Refill  . Alcohol Swabs (B-D SINGLE USE SWABS REGULAR) PADS Use as directed    . aspirin 81 MG tablet Take 81 mg by mouth daily.      . Blood Glucose Monitoring Suppl (TRUERESULT BLOOD GLUCOSE) W/DEVICE KIT by Does not apply route.    Elmore Guise Devices MISC by Does not apply route.    . metFORMIN (GLUCOPHAGE-XR) 500 MG 24 hr tablet Take 1 by mouth once a day. 90 tablet 3  . NON FORMULARY CPAP + 11 CM, full face mask     . sitaGLIPtin (JANUVIA) 100 MG tablet Take 1 tablet (100 mg total) by mouth daily. 90 tablet 3  . TRUEPLUS LANCETS 30G MISC USE AS DIRECTED ONCE DAILY 100 each 2  . TRUETEST TEST test strip USE AS DIRECTED ONCE DAILY 100 each 0  . lisinopril (PRINIVIL,ZESTRIL) 5 MG tablet Take 1 tablet (5 mg total) by  mouth daily. (Patient not taking: Reported on 11/14/2014) 90 tablet 3   No current facility-administered medications for this visit.    Functional Status:   In your present state of health, do you have any difficulty performing the following activities: 06/04/2015 02/07/2015  Hearing? N N  Vision? N N  Difficulty concentrating or making decisions? N N  Walking or climbing stairs? N N  Dressing or bathing? N N  Doing errands, shopping? N N    Fall/Depression Screening:    PHQ 2/9 Scores 06/04/2015 05/29/2015 02/07/2015 04/18/2014 02/18/2014 09/22/2013 03/09/2013  PHQ - 2 Score 0 0 0 0 0 0 0    Assessment:   Member seen for follow up office visit for Link to Wellness program for self management of Type 2 diabetes.  Member not meeting diabetes self management goal of 7 or less with last reading 7.4.  Member has not been adhering to diet and exercise recommendations.  CBGs range 145-289 post prandial with no fasting readings.   Member is past due for annual eye exam and regular dental check up.  Plan:  Plan to check blood sugar every day: fasting and 1 -2 hours after eating with goals of  80-130 and less than 180 after eating. Plan to exercise three times a week at the gym for 60 minutes Plan to eat breakfast  with protein every day Plan to take lunch to work three days a week Plan to call to schedule eye exam Plan to complete EMMI programs by 07/24/15 Plan to return to Link to Wellness on 09/04/15 at 3:30PM at [place] [facility]        Most Recent Value   Care Plan Problem One  Elevated blood sugars related to dx of type 2 DM    Role Documenting the Problem One  Care Management [place] for Problem One  Active   [facility]N Long Term Goal (31-90 days)  Member will decrease hemoglobin A1C to 7 or below in the next 90 days   THN Long Term Goal Start Date  06/04/15 [facility] not at goal with last hemoglobin A1C of 7.4]   Interventions for Problem One Long Term Goal    Reinforced CHO counting and protein control, Reinforced to make wise choices when eating out, Instructed to try to take his lunch a few times a week if possible, Instructed on the importance of annual eye exams and regular dental check ups, Reinforced importance of exercise to glycemic control and how it can also help with his stress    [facility] CM Care Plan Problem Two        Most Recent Value   Care Plan for Problem Two  Not Active    Peter Garter RN, [facility] Care Management Coordinator-Link to [place] Management [phone]

## 2015-06-04 NOTE — Patient Instructions (Signed)
1. Plan to check blood sugar every day: fasting and 1 -2 hours after eating with goals of 80-130 and less than 180 after eating. 2. Plan to exercise three times a week at the gym for 60 minutes 3. Plan to eat breakfast  with protein every day 4. Plan to take lunch to work three days a week 5. Plan to call to schedule eye exam 6. Plan to complete EMMI programs by 07/24/15 7. Plan to return to [facility] to Wellness on 09/04/15 at 3:30PM at [facility]

## 2015-06-06 ENCOUNTER — Ambulatory Visit: Payer: 59

## 2015-06-14 ENCOUNTER — Telehealth: Payer: Self-pay | Admitting: Internal Medicine

## 2015-06-14 NOTE — Telephone Encounter (Signed)
Patient Name: [name]  DOB: [birth year]    Initial Comment Caller states he was seen, and have blood work. Sore a week where he had the blood.   Nurse Assessment      Guidelines    Guideline Title Affirmed Question Affirmed Notes       Final Disposition User   FINAL ATTEMPT MADE - message left Sherilyn Cooter, RN, [facility]

## 2015-07-02 DIAGNOSIS — L738 Other specified follicular disorders: Secondary | ICD-10-CM | POA: Diagnosis not present

## 2015-07-02 DIAGNOSIS — E119 Type 2 diabetes mellitus without complications: Secondary | ICD-10-CM | POA: Diagnosis not present

## 2015-07-02 DIAGNOSIS — H5213 Myopia, bilateral: Secondary | ICD-10-CM | POA: Diagnosis not present

## 2015-07-02 DIAGNOSIS — H52223 Regular astigmatism, bilateral: Secondary | ICD-10-CM | POA: Diagnosis not present

## 2015-07-02 LAB — HM DIABETES EYE EXAM

## 2015-07-02 MED FILL — MUPIROCIN 2% OINTMENT: 2 | 7 days supply | Qty: 22 | Fill #0

## 2015-07-16 ENCOUNTER — Other Ambulatory Visit: Payer: Self-pay | Admitting: Internal Medicine

## 2015-07-17 MED FILL — TRUE METRIX GLUCOSE TEST ST: 90 days supply | Qty: 100 | Fill #0

## 2015-07-17 MED FILL — TRUEplus LANCETS 30G MISC: 90 days supply | Qty: 100 | Fill #0

## 2015-07-26 ENCOUNTER — Encounter: Payer: Self-pay | Admitting: Internal Medicine

## 2015-08-16 MED FILL — METFORMIN HCL ER 500 MG TAB: 500 | 90 days supply | Qty: 90 | Fill #3

## 2015-08-16 MED FILL — JANUVIA 100 MG TABLET: 100 | 90 days supply | Qty: 90 | Fill #1

## 2015-09-04 ENCOUNTER — Ambulatory Visit: Payer: Self-pay

## 2015-10-02 ENCOUNTER — Other Ambulatory Visit: Payer: Self-pay

## 2015-10-02 VITALS — BP 124/78 | HR 72 | Resp 16 | Ht 71.0 in | Wt 229.0 lb

## 2015-10-02 DIAGNOSIS — E119 Type 2 diabetes mellitus without complications: Secondary | ICD-10-CM

## 2015-10-02 LAB — POCT GLYCOSYLATED HEMOGLOBIN (HGB A1C): Hemoglobin A1C: 7.4

## 2015-10-02 NOTE — Patient Instructions (Signed)
1. Plan to check blood sugar every day: fasting and 1 -2 hours after eating with goals of 80-130 and less than 180 after eating. 2. Plan to exercise three times a week at the gym for 60 minutes 3. Plan to eat breakfast  with protein every day 4. Plan to take lunch to work three days a week 5. Plan to call to schedule follow up appointment with Dr. Yetta BarreJones 6. Plan to return to Link to Wellness on 01/01/16 at 9AM at [facility]

## 2015-10-02 NOTE — Patient Outreach (Signed)
[facility]) Care Management   10/02/2015  [name] 01-09-1971 [id number]  [name] is an 45 y.o. male.   Member seen for follow up office visit for Link to Wellness program for self management of Type 2 diabetes  Subjective: Member states that he has been working very long hours for the last few weeks.  States that they have been eating out more.  States he has been playing basketball once a week and going to the gym twice a week.  States that he has lost a few pounds.  States he did have his eye exam in February.     Objective:   Review of Systems  All other systems reviewed and are negative. POC Hemoglobin A1C- 7.4% Reviewed glucometer 7 day average-156 14 day average-172 30 day average -189  Physical Exam Today's Vitals   10/02/15 0926  BP: 124/78  Pulse: 72  Resp: 16  Height: 1.803 m ('5\' 11"' )  Weight: 229 lb (103.874 kg)  SpO2: 98%  PainSc: 0-No pain   Encounter Medications:   Outpatient Encounter Prescriptions as of 10/02/2015  Medication Sig  . Alcohol Swabs (B-D SINGLE USE SWABS REGULAR) PADS Use as directed  . aspirin 81 MG tablet Take 81 mg by mouth daily.    Elmore Guise Devices MISC by Does not apply route.  . metFORMIN (GLUCOPHAGE-XR) 500 MG 24 hr tablet Take 1 by mouth once a day.  . sitaGLIPtin (JANUVIA) 100 MG tablet Take 1 tablet (100 mg total) by mouth daily.  . TRUE METRIX BLOOD GLUCOSE TEST test strip USE TO CHECK BLOOD SUGAR ONCE A DAY AS DIRECTED  . TRUEPLUS LANCETS 30G MISC USE AS DIRECTED ONCE DAILY  . Blood Glucose Monitoring Suppl (TRUERESULT BLOOD GLUCOSE) W/DEVICE KIT by Does not apply route. Reported on 10/02/2015  . lisinopril (PRINIVIL,ZESTRIL) 5 MG tablet Take 1 tablet (5 mg total) by mouth daily. (Patient not taking: Reported on 11/14/2014)  . NON FORMULARY Reported on 10/02/2015   No facility-administered encounter medications on file as of 10/02/2015.    Functional Status:   In your present state of health, do you  have any difficulty performing the following activities: 10/02/2015 06/04/2015  Hearing? N N  Vision? N N  Difficulty concentrating or making decisions? N N  Walking or climbing stairs? N N  Dressing or bathing? N N  Doing errands, shopping? N N    Fall/Depression Screening:    PHQ 2/9 Scores 10/02/2015 06/04/2015 05/29/2015 02/07/2015 04/18/2014 02/18/2014 09/22/2013  PHQ - 2 Score 0 0 0 0 0 0 0    Assessment:  Member seen for follow up office visit for Link to Wellness program for self management of Type 2 diabetes. Member not meeting diabetes self management goal of 7 or less with todays reading 7.4. Member has not been adhering to diet recommendations.  Member is exercising 3 times a week.  Member has lost 9.6 lbs since last visit CBGs range 135-260. Member is due for follow up with primary care provider.  Plan:  Plan to check blood sugar every day: fasting and 1 -2 hours after eating with goals of 80-130 and less than 180 after eating. Plan to exercise three times a week at the gym for 60 minutes Plan to eat breakfast  with protein every day Plan to take lunch to work three days a week Plan to call to schedule follow up appointment with Dr. Ronnald Ramp Plan to return to Link to Wellness on 01/01/16 at [facility]  at [place] [facility]        Most Recent Value   Care Plan Problem One  Elevated blood sugars related to dx of type 2 DM    Role Documenting the Problem One  Care Management Coordinator   Care Plan for Problem One  Active   THN Long Term Goal (31-90 days)  Member will decrease hemoglobin A1C to 7 or below in the next 90 days   THN Long Term Goal Start Date  10/02/15 [Continue not at goal hemoglobin A1C of 7.4 today]   Interventions for Problem One Long Term Goal   Reinforced CHO counting and protein control, Reinforced to make wise choices when eating out,Discussed different fast food places and what are good choices at them,  Reinforced to try to take his lunch a few times  a week if possible, Instructed to try to prepare his lunch the night before and discussed quick adn easy proteins he can pack,  Instructed to call MD to schedule a follow up appointment, Reinforced importance of exercise to glycemic control , Discussed the progressive nature of diabetes and that his medication might need to be increased , Reinforced blood sugar goals fasting and after meals    Peter Garter RN, [facility] Care Management Coordinator-Link to [place] Management [phone]

## 2015-10-31 ENCOUNTER — Other Ambulatory Visit: Payer: Self-pay | Admitting: *Deleted

## 2015-10-31 MED ORDER — METFORMIN HCL ER 500 MG PO TB24: ORAL_TABLET | ORAL | Status: DC

## 2015-10-31 NOTE — Telephone Encounter (Signed)
[facility]ing [facility] on [facility] metformin...Raechel Chute/LMB

## 2015-11-11 ENCOUNTER — Other Ambulatory Visit: Payer: Self-pay | Admitting: Internal Medicine

## 2015-11-11 MED FILL — JANUVIA 100 MG TABLET: 100 | 90 days supply | Qty: 90 | Fill #2

## 2015-11-11 MED FILL — METFORMIN HCL ER 500 MG TAB: 500 | 90 days supply | Qty: 90 | Fill #0

## 2015-11-12 MED FILL — TRUE METRIX GLUCOSE TEST ST: 90 days supply | Qty: 100 | Fill #0

## 2015-11-12 MED FILL — TRUEplus LANCETS 30G MISC: 90 days supply | Qty: 100 | Fill #0

## 2015-12-17 ENCOUNTER — Other Ambulatory Visit (INDEPENDENT_AMBULATORY_CARE_PROVIDER_SITE_OTHER): Payer: 59

## 2015-12-17 ENCOUNTER — Encounter: Payer: Self-pay | Admitting: Internal Medicine

## 2015-12-17 ENCOUNTER — Ambulatory Visit (INDEPENDENT_AMBULATORY_CARE_PROVIDER_SITE_OTHER): Payer: 59 | Admitting: Internal Medicine

## 2015-12-17 VITALS — BP 124/68 | HR 77 | Resp 20 | Wt 225.0 lb

## 2015-12-17 DIAGNOSIS — E119 Type 2 diabetes mellitus without complications: Secondary | ICD-10-CM

## 2015-12-17 DIAGNOSIS — G4730 Sleep apnea, unspecified: Secondary | ICD-10-CM | POA: Diagnosis not present

## 2015-12-17 DIAGNOSIS — E785 Hyperlipidemia, unspecified: Secondary | ICD-10-CM | POA: Diagnosis not present

## 2015-12-17 DIAGNOSIS — L600 Ingrowing nail: Secondary | ICD-10-CM

## 2015-12-17 LAB — HEMOGLOBIN A1C: Hgb A1c MFr Bld: 6.5 % (ref 4.6–6.5)

## 2015-12-17 NOTE — Progress Notes (Signed)
Pre visit review using our clinic review tool, if applicable. No additional management support is needed unless otherwise documented below in the visit note. 

## 2015-12-17 NOTE — Progress Notes (Signed)
Subjective:    Patient ID: [name], male    DOB: 08/20/1970, 45 y.o.   MRN: [id number]  HPI   Here to f/u; overall doing ok,  Pt denies chest pain, increasing sob or doe, wheezing, orthopnea, PND, increased LE swelling, palpitations, dizziness or syncope.  Pt denies new neurological symptoms such as new headache, or facial or extremity weakness or numbness.  Pt denies polydipsia, polyuria, or low sugar episode.   Pt denies new neurological symptoms such as new headache, or facial or extremity weakness or numbness.   Pt states overall good compliance with meds, mostly trying to follow appropriate diet,  but little exercise however.  Lost over 10 lbs since jan 2017 with better diet Wt Readings from Last 3 Encounters:  12/17/15 225 lb (102.1 kg)  10/02/15 229 lb (103.9 kg)  06/04/15 238 lb 9.6 oz (108.2 kg)  does also have a left great toe ingrown nail, ongoing for months but now more painful and irritated without pus or drainage, no red streaks or other swelling Past Medical History:  Diagnosis Date  . ALLERGIC RHINITIS 12/31/2008  . DIABETES MELLITUS, TYPE II 06/03/2009  . Dyslipidemia 10/31/2014  . FOOT PAIN, RIGHT 08/08/2010  . HYPERSOMNIA 11/30/2008  . Lumbar disc disease 03/01/2011  . OBSTRUCTIVE SLEEP APNEA 02/03/2009  . OTHER INFLAMMATORY DISORDER MALE GENITAL ORGANS 11/30/2008   Past Surgical History:  Procedure Laterality Date  . HERNIA REPAIR  2012   [facility]    reports that he has never smoked. He has never used smokeless tobacco. He reports that he drinks alcohol. He reports that he does not use drugs. family history includes Cancer in his other; Coronary artery disease in his father; Depression in his maternal grandmother and mother; Diabetes in his father; Heart attack in his father; Hypertension in his father. Allergies  Allergen Reactions  . Penicillins     Childhood allergy   Current Outpatient Prescriptions on File Prior to Visit  Medication Sig Dispense Refill  . Alcohol  Swabs (B-D SINGLE USE SWABS REGULAR) PADS Use as directed    . aspirin 81 MG tablet Take 81 mg by mouth daily.      . Blood Glucose Monitoring Suppl (TRUERESULT BLOOD GLUCOSE) W/DEVICE KIT by Does not apply route. Reported on 10/02/2015    . Lancet Devices MISC by Does not apply route.    . metFORMIN (GLUCOPHAGE-XR) 500 MG 24 hr tablet Take 1 by mouth once a day. 90 tablet 2  . NON FORMULARY Reported on 10/02/2015    . sitaGLIPtin (JANUVIA) 100 MG tablet Take 1 tablet (100 mg total) by mouth daily. 90 tablet 3  . TRUE METRIX BLOOD GLUCOSE TEST test strip USE TO CHECK BLOOD SUGAR ONCE A DAY AS DIRECTED 100 each 5  . TRUEPLUS LANCETS 30G MISC USE AS DIRECTED ONCE DAILY 100 each 5   No current facility-administered medications on file prior to visit.    Review of Systems  Constitutional: Negative for unusual diaphoresis or night sweats HENT: Negative for ear swelling or discharge Eyes: Negative for worsening visual haziness  Respiratory: Negative for choking and stridor.   Gastrointestinal: Negative for distension or worsening eructation Genitourinary: Negative for retention or change in urine volume.  Musculoskeletal: Negative for other MSK pain or swelling Skin: Negative for color change and worsening wound Neurological: Negative for tremors and numbness other than noted  Psychiatric/Behavioral: Negative for decreased concentration or agitation other than above       Objective:  Physical Exam BP 124/68   Pulse 77   Resp 20   Wt 225 lb (102.1 kg)   SpO2 96%   BMI 31.38 kg/m  VS noted,  Constitutional: Pt appears in no apparent distress HENT: Head: NCAT.  Right Ear: External ear normal.  Left Ear: External ear normal.  Eyes: . Pupils are equal, round, and reactive to light. Conjunctivae and EOM are normal Neck: Normal range of motion. Neck supple.  Cardiovascular: Normal rate and regular rhythm.   Pulmonary/Chest: Effort normal and breath sounds without rales or wheezing.    Neurological: Pt is alert. Not confused , motor grossly intact Skin: Skin is warm. No rash, no LE edema, left great toenail ingrown medial aspect without erythema, drainage, red streaks Psychiatric: Pt behavior is normal. No agitation.     Assessment & Plan:

## 2015-12-17 NOTE — Patient Instructions (Addendum)
Please continue all other medications as before, and refills have been done if requested.  Please have the pharmacy call with any other refills you may need.  Please continue your efforts at being more active, low cholesterol diet, and weight control.  You will be contacted regarding the referral for: podiatry  Please keep your appointments with your specialists as you may have planned  Please go to the LAB in the Basement (turn left off the elevator) for the tests to be done today  You will be contacted by phone if any changes need to be made immediately.  Otherwise, you will receive a letter about your results with an explanation, but please check with MyChart first.  Please remember to sign up for MyChart if you have not done so, as this will be important to you in the future with finding out test results, communicating by private email, and scheduling acute appointments online when needed.  Please return in 6 months, or sooner if needed, with Lab testing done 3-5 days before

## 2015-12-18 ENCOUNTER — Encounter: Payer: Self-pay | Admitting: Internal Medicine

## 2015-12-18 LAB — BASIC METABOLIC PANEL
BUN: 17 mg/dL (ref 6–23)
CALCIUM: 9.5 mg/dL (ref 8.4–10.5)
CO2: 28 meq/L (ref 19–32)
CREATININE: 1.21 mg/dL (ref 0.40–1.50)
Chloride: 103 mEq/L (ref 96–112)
GFR: 68.78 mL/min (ref >60.00)
Glucose, Bld: 111 mg/dL — ABNORMAL HIGH (ref 70–99)
Potassium: 4.1 mEq/L (ref 3.5–5.1)
Sodium: 138 mEq/L (ref 135–145)

## 2015-12-18 LAB — HEPATIC FUNCTION PANEL
ALT: 24 U/L (ref 0–53)
AST: 21 U/L (ref 0–37)
Albumin: 4.3 g/dL (ref 3.5–5.2)
Alkaline Phosphatase: 60 U/L (ref 39–117)
BILIRUBIN DIRECT: 0.1 mg/dL (ref 0.0–0.3)
BILIRUBIN TOTAL: 0.5 mg/dL (ref 0.2–1.2)
TOTAL PROTEIN: 7.1 g/dL (ref 6.0–8.3)

## 2015-12-18 LAB — LIPID PANEL
CHOL/HDL RATIO: 3
Cholesterol: 110 mg/dL (ref 0–200)
HDL: 34.9 mg/dL — AB (ref >39.00)
NONHDL: 75.56
TRIGLYCERIDES: 263 mg/dL — AB (ref 0.0–149.0)
VLDL: 52.6 mg/dL — AB (ref 0.0–40.0)

## 2015-12-18 LAB — LDL CHOLESTEROL, DIRECT: Direct LDL: 50 mg/dL

## 2015-12-19 ENCOUNTER — Encounter (HOSPITAL_COMMUNITY): Payer: Self-pay | Admitting: Emergency Medicine

## 2015-12-19 ENCOUNTER — Ambulatory Visit (HOSPITAL_COMMUNITY)
Admission: EM | Admit: 2015-12-19 | Discharge: 2015-12-19 | Disposition: A | Discharge status: Home / Self Care | Payer: 59 | Attending: Family Medicine | Admitting: Family Medicine

## 2015-12-19 DIAGNOSIS — S68119A Complete traumatic metacarpophalangeal amputation of unspecified finger, initial encounter: Secondary | ICD-10-CM | POA: Diagnosis not present

## 2015-12-19 DIAGNOSIS — S61209A Unspecified open wound of unspecified finger without damage to nail, initial encounter: Secondary | ICD-10-CM

## 2015-12-19 MED ORDER — DOXYCYCLINE HYCLATE 100 MG PO CAPS: 100.0000 mg | ORAL_CAPSULE | Freq: Two times a day (BID) | ORAL | 0 refills | Status: DC

## 2015-12-19 NOTE — ED Provider Notes (Signed)
CSN: [id number]     Arrival date & time 12/19/15  1801 History   First MD Initiated Contact with Patient 12/19/15 1852     Chief Complaint  Patient presents with  . Finger Injury    evulsion   (Consider location/radiation/quality/duration/timing/severity/associated sxs/prior Treatment) Patient cut the tip of his finger off with knife today.  Left index finger.      Past Medical History:  Diagnosis Date  . ALLERGIC RHINITIS 12/31/2008  . DIABETES MELLITUS, TYPE II 06/03/2009  . Dyslipidemia 10/31/2014  . FOOT PAIN, RIGHT 08/08/2010  . HYPERSOMNIA 11/30/2008  . Lumbar disc disease 03/01/2011  . OBSTRUCTIVE SLEEP APNEA 02/03/2009  . OTHER INFLAMMATORY DISORDER MALE GENITAL ORGANS 11/30/2008   Past Surgical History:  Procedure Laterality Date  . HERNIA REPAIR  2012   [facility]   Family History  Problem Relation Age of Onset  . Depression Mother   . Diabetes Father   . Hypertension Father   . Heart attack Father   . Coronary artery disease Father   . Depression Maternal Grandmother   . Cancer Other     facial skin cancers   Social History  Substance Use Topics  . Smoking status: Never Smoker  . Smokeless tobacco: Never Used  . Alcohol use Yes     Comment: social    Review of Systems  Constitutional: Negative.   HENT: Negative.   Eyes: Negative.   Respiratory: Negative.   Cardiovascular: Negative.   Endocrine: Negative.   Genitourinary: Negative.   Musculoskeletal: Positive for arthralgias and myalgias.  Skin: Positive for wound.  Allergic/Immunologic: Negative.   Neurological: Negative.   Hematological: Negative.   Psychiatric/Behavioral: Negative.     Allergies  Penicillins  Home Medications   Prior to Admission medications   Medication Sig Start Date End Date Taking? Authorizing Provider  Alcohol Swabs (B-D SINGLE USE SWABS REGULAR) PADS Use as directed    Historical Provider, MD  aspirin 81 MG tablet Take 81 mg by mouth daily.      Historical Provider, MD   Blood Glucose Monitoring Suppl (TRUERESULT BLOOD GLUCOSE) W/DEVICE KIT by Does not apply route. Reported on 10/02/2015    Historical Provider, MD  doxycycline (VIBRAMYCIN) 100 MG capsule Take 1 capsule (100 mg total) by mouth 2 (two) times daily. 12/19/15   Lysbeth Penner, FNP  Lancet Devices MISC by Does not apply route.    Historical Provider, MD  metFORMIN (GLUCOPHAGE-XR) 500 MG 24 hr tablet Take 1 by mouth once a day. 10/31/15   Biagio Borg, MD  NON FORMULARY Reported on 10/02/2015    Historical Provider, MD  sitaGLIPtin (JANUVIA) 100 MG tablet Take 1 tablet (100 mg total) by mouth daily. 05/30/15   Biagio Borg, MD  TRUE METRIX BLOOD GLUCOSE TEST test strip USE TO CHECK BLOOD SUGAR ONCE A DAY AS DIRECTED 11/12/15   Biagio Borg, MD  TRUEPLUS LANCETS 30G MISC USE AS DIRECTED ONCE DAILY 11/12/15   Biagio Borg, MD   Meds Ordered and Administered this Visit  Medications - No data to display  BP 113/86 (BP Location: Right Arm)   Pulse 76   Temp 97.8 F (36.6 C) (Oral)   Resp 16   SpO2 100%  No data found.   Physical Exam  Constitutional: He appears well-developed and well-nourished.  HENT:  Head: Normocephalic and atraumatic.  Right Ear: External ear normal.  Eyes: EOM are normal. Pupils are equal, round, and reactive to light.  Neck: Normal range  of motion. Neck supple.  Cardiovascular: Normal rate, regular rhythm and normal heart sounds.   Pulmonary/Chest: Effort normal and breath sounds normal.  Abdominal: Soft. Bowel sounds are normal.  Musculoskeletal:  Left index finger tip cut off and minimal amount of bleeding.  Nursing note and vitals reviewed.   Urgent Care Course   Clinical Course    Wound repair Date/Time: 12/19/2015 7:58 PM Performed by: Lysbeth Penner Authorized by: Lysbeth Penner  Consent: Verbal consent obtained. Written consent not obtained. Risks and benefits: risks, benefits and alternatives were discussed Consent given by: patient Patient  understanding: patient states understanding of the procedure being performed Patient consent: the patient's understanding of the procedure matches consent given Procedure consent: procedure consent matches procedure scheduled Relevant documents: relevant documents present and verified Local anesthesia used: yes  Anesthesia: Local anesthesia used: yes Local Anesthetic: lidocaine 1% without epinephrine  Sedation: Patient sedated: no Patient tolerance: Patient tolerated the procedure well with no immediate complications Comments: Left index finger anesthetized with digitial block with lidocaine 1% one ml each side.  Left index finger cleaned with betadine and then silvadene used for cautery and then petroleum guaze used and then 2x2 over that and coban dressing used to cover.    (including critical care time)  Labs Review Labs Reviewed - No data to display  Imaging Review No results found.   Visual Acuity Review  Right Eye Distance:   Left Eye Distance:   Bilateral Distance:    Right Eye Near:   Left Eye Near:    Bilateral Near:         MDM   1. Avulsion of fingertip, initial encounter     See procedure Doxycycline 110m po bid x10days TD is UTD Keep dry for next few days Apply neosporin and dressing qd.   WLysbeth Penner FNP 12/19/15 2000

## 2015-12-19 NOTE — ED Triage Notes (Signed)
Pt. Stated I was slicing a cucumber and cut the tip of my finger. Pt. Has an evulsion on the tip of finger.

## 2015-12-20 MED FILL — DOXYCYCLINE HYCLATE 100 MG: 100 | 10 days supply | Qty: 20 | Fill #0

## 2015-12-22 NOTE — Assessment & Plan Note (Signed)
To cont tx as is for now, may be able to re-evaluate if cont'd wt loss

## 2015-12-22 NOTE — Assessment & Plan Note (Signed)
Mild to mod, for podiatry referral,  to f/u any worsening symptoms or concerns 

## 2015-12-22 NOTE — Assessment & Plan Note (Signed)
stable overall by history and exam, recent data reviewed with pt, and pt to continue medical treatment as before,  to f/u any worsening symptoms or concerns Lab Results  Component Value Date   LDLCALC 59 10/31/2014

## 2015-12-22 NOTE — Assessment & Plan Note (Signed)
stable overall by history and exam, recent data reviewed with pt, and pt to continue medical treatment as before,  to f/u any worsening symptoms or concerns Lab Results  Component Value Date   HGBA1C 6.5 12/17/2015

## 2016-01-01 ENCOUNTER — Other Ambulatory Visit: Payer: Self-pay

## 2016-01-01 VITALS — BP 104/62 | HR 65 | Resp 16 | Ht 71.0 in | Wt 224.1 lb

## 2016-01-01 DIAGNOSIS — E119 Type 2 diabetes mellitus without complications: Secondary | ICD-10-CM

## 2016-01-01 NOTE — Patient Outreach (Signed)
[facility]) Care Management   01/01/2016  SHERRELL FARISH 01-Dec-1970 [id number]  [name] is an 45 y.o. male.   Member seen for follow up office visit for Link to Wellness program for self management of Type 2 diabetes  Subjective: Mmeber states that he has been following a diet along with his wife called 21 day fix.  States that he is eating healthier and he is taking his lunch to work 3-4 days a week now.  States he has lost 13 Lbs since the first of the year.  States his blood sugars have been better except for last week when he as away at church camp and he had no control over the food served.  States he has been exercising more and doing the exercise videos with his wife almost daily.  States that his hemoglobin A1C was down to 6.5% when he saw Dr.Jones last month.  Objective:   ROS  Physical Exam  Skin: There is erythema.      Today's Vitals   01/01/16 0914  BP: 104/62  Pulse: 65  Resp: 16  SpO2: 98%  Weight: 224 lb 1.6 oz (101.7 kg)  Height: 1.803 m (5' 11")  PainSc: 0-No pain    Encounter Medications:   Outpatient Encounter Prescriptions as of 01/01/2016  Medication Sig  . Alcohol Swabs (B-D SINGLE USE SWABS REGULAR) PADS Use as directed  . aspirin 81 MG tablet Take 81 mg by mouth daily.    . Blood Glucose Monitoring Suppl (TRUERESULT BLOOD GLUCOSE) W/DEVICE KIT by Does not apply route. Reported on 10/02/2015  . Lancet Devices MISC by Does not apply route.  . metFORMIN (GLUCOPHAGE-XR) 500 MG 24 hr tablet Take 1 by mouth once a day.  . sitaGLIPtin (JANUVIA) 100 MG tablet Take 1 tablet (100 mg total) by mouth daily.  . TRUE METRIX BLOOD GLUCOSE TEST test strip USE TO CHECK BLOOD SUGAR ONCE A DAY AS DIRECTED  . TRUEPLUS LANCETS 30G MISC USE AS DIRECTED ONCE DAILY  . doxycycline (VIBRAMYCIN) 100 MG capsule Take 1 capsule (100 mg total) by mouth 2 (two) times daily. (Patient not taking: Reported on 01/01/2016)  . NON FORMULARY Reported on 10/02/2015   No  facility-administered encounter medications on file as of 01/01/2016.     Functional Status:   In your present state of health, do you have any difficulty performing the following activities: 01/01/2016 10/02/2015  Hearing? N N  Vision? N N  Difficulty concentrating or making decisions? N N  Walking or climbing stairs? - N  Dressing or bathing? N N  Doing errands, shopping? N N  Some recent data might be hidden    Fall/Depression Screening:    PHQ 2/9 Scores 01/01/2016 10/02/2015 06/04/2015 05/29/2015 02/07/2015 04/18/2014 02/18/2014  PHQ - 2 Score 0 0 0 0 0 0 0    Assessment:  Member seen for follow up office visit for Link to Wellness program for self management of Type 2 diabetes. Member is meeting diabetes self management goal of 7% or less with last reading of 6.5%.   Member has not been adhering to diet recommendations and has lost 13lbs since January.  Member is exercising 4-5 times a week.   CBGs range 94-200.  Member is due to dental checkup.  Member has referral to see podiatrist for ingrown nail but has not made his appointment.  Plan:   Plan to check blood sugar every day: fasting and 1 -2 hours after eating with goals of  80-130 and less than 180 after eating. Plan to exercise 2 times a week at the gym for 60 minutes and do home videos 3 days a week for 30 minutes Plan to eat breakfast  with protein every day Plan to take lunch to work four days a week Plan to call to schedule dental appointment Plan to call to schedule podiatry appointment  Plan to return to Link to Wellness on 04/06/16 at 9AM at [place] office  [facility] CM Care Plan Problem One   Flowsheet Row Most Recent Value  Care Plan Problem One  Elevated blood sugars related to dx of type 2 DM   Role Documenting the Problem One  Care Management Coordinator  Care Plan for Problem One  Active  [facility] Long Term Goal (31-90 days)  Member will decrease hemoglobin A1C to 7 or below in the next 90 days  [facility] Long Term Goal Start  Date  01/01/16  Interventions for Problem One Long Term Goal   Reinforced CHO counting and protein control, Praised for taking his lunchs and reinforced to continue to take his lunch 4 times a week ,  Instructed to call podiatrist and dentist to schedule  appointments, Reinforced importance of exercise to glycemic control , Reinforced blood sugar goals fasting and after meals, Reviewed foot care     Melissa Sandlin RN, BSN,CCM Care Management Coordinator-Link to Wellness [facility] Care Management [phone]  

## 2016-01-01 NOTE — Patient Instructions (Signed)
1. Plan to check blood sugar every day: fasting and 1 -2 hours after eating with goals of 80-130 and less than 180 after eating. 2. Plan to exercise 2 times a week at the gym for 60 minutes and do home videos 3 days a week for 30 minutes 3. Plan to eat breakfast  with protein every day 4. Plan to take lunch to work four days a week 5. Plan to call to schedule dental appointment 6. Plan to call to schedule podiatry appointment  7. Plan to [facility]

## 2016-01-31 ENCOUNTER — Encounter: Payer: Self-pay | Admitting: Podiatry

## 2016-01-31 ENCOUNTER — Ambulatory Visit (INDEPENDENT_AMBULATORY_CARE_PROVIDER_SITE_OTHER): Payer: 59 | Admitting: Podiatry

## 2016-01-31 DIAGNOSIS — L03032 Cellulitis of left toe: Secondary | ICD-10-CM | POA: Diagnosis not present

## 2016-01-31 DIAGNOSIS — L600 Ingrowing nail: Secondary | ICD-10-CM

## 2016-01-31 NOTE — Progress Notes (Signed)
Patient ID: [name], male   DOB: 01/01/1971, 45 y.o.   MRN: [id number] Subjective: Patient presents today with pain to the medial aspect left hallux. The patient states that he thinks he has no ingrown nails wife's been trimming the now for the past several months. There's been no alleviation of symptoms the patient however. Patient presents today for further evaluation and treatment  Patient Active Problem List   Diagnosis Date Noted  . Ingrown nail 05/30/2015  . Skin lesion 05/30/2015  . Dyslipidemia 10/31/2014  . Obesity 10/31/2014  . Low back pain 03/09/2013  . Lumbar disc disease 03/01/2011  . Preventative health care 02/22/2011  . Diabetes (HCC) 06/03/2009  . Sleep apnea 02/03/2009  . ALLERGIC RHINITIS 12/31/2008    Allergies  Allergen Reactions  . Penicillins     Childhood allergy    Objective:  General: Well developed, nourished, in no acute distress, alert and oriented x3   Dermatology: Skin is warm, dry and supple bilateral.Medial border of the left hallux nail appears to be  severely incurvated with hyperkeratosis formation at the distal aspects of  the medial and lateral nail border. The remaining nails appear unremarkable at this time. There are no open sores, lesions.  Vascular: Dorsalis Pedis artery and Posterior Tibial artery pedal pulses palpable. No lower extremity edema noted.   Neruologic: Grossly intact via light touch bilateral.  Musculoskeletal: Tenderness to palpation of the Medial aspect of the hallux nail fold(s). Muscular strength within normal limits in all groups bilateral.   Assesement: #1 paronychia left hallux medial border #2 ingrowing nail left hallux medial border #3 pain in left great toe   Plan of Care:  1. Patient evaluated.  2. Discussed treatment alternatives and plan of care; Explained nail avulsion procedure and post procedure course to patient. 3. Patient opted for partial nail avulsion.  4. Prior to procedure, local  anesthesia infiltration utilized using 3 ml of a 50:50 mixture of 2% plain lidocaine and 0.5% plain marcaine in a normal hallux block fashion and a betadine prep performed.  5. Partial permanent nail avulsion performed including the respective nail matrix using 3x30sec applications of phenol followed by alcohol flush on theLeft great toe medial border.  6. Light dressing applied. 7. Return to clinic in 2 weeks.   Felecia ShellingBrent M Evans, DPM

## 2016-01-31 NOTE — Progress Notes (Signed)
   Subjective:    Patient ID: [name], male    DOB: 12/08/1970, 45 y.o.   MRN: [id number]  HPI    Review of Systems  All other systems reviewed and are negative.      Objective:   Physical Exam        Assessment & Plan:

## 2016-01-31 NOTE — Patient Instructions (Signed)

## 2016-02-07 MED FILL — METFORMIN HCL ER 500 MG TAB: 500 | 90 days supply | Qty: 90 | Fill #1

## 2016-02-07 MED FILL — TRUE METRIX GLUCOSE TEST ST: 90 days supply | Qty: 100 | Fill #1

## 2016-02-07 MED FILL — TRUEplus LANCETS 30G MISC: 90 days supply | Qty: 100 | Fill #1

## 2016-02-07 MED FILL — JANUVIA 100 MG TABLET: 100 | 90 days supply | Qty: 90 | Fill #3

## 2016-02-18 ENCOUNTER — Ambulatory Visit: Payer: 59 | Admitting: Podiatry

## 2016-04-01 IMAGING — CR DG FOOT COMPLETE 3+V*R*
3 series · 3 of 3 positions shown · non-contrast
Comparison: None.

CLINICAL DATA: Pain post trauma

EXAM:
RIGHT FOOT COMPLETE - 3+ VIEW

[view not recorded (1 of 3)]
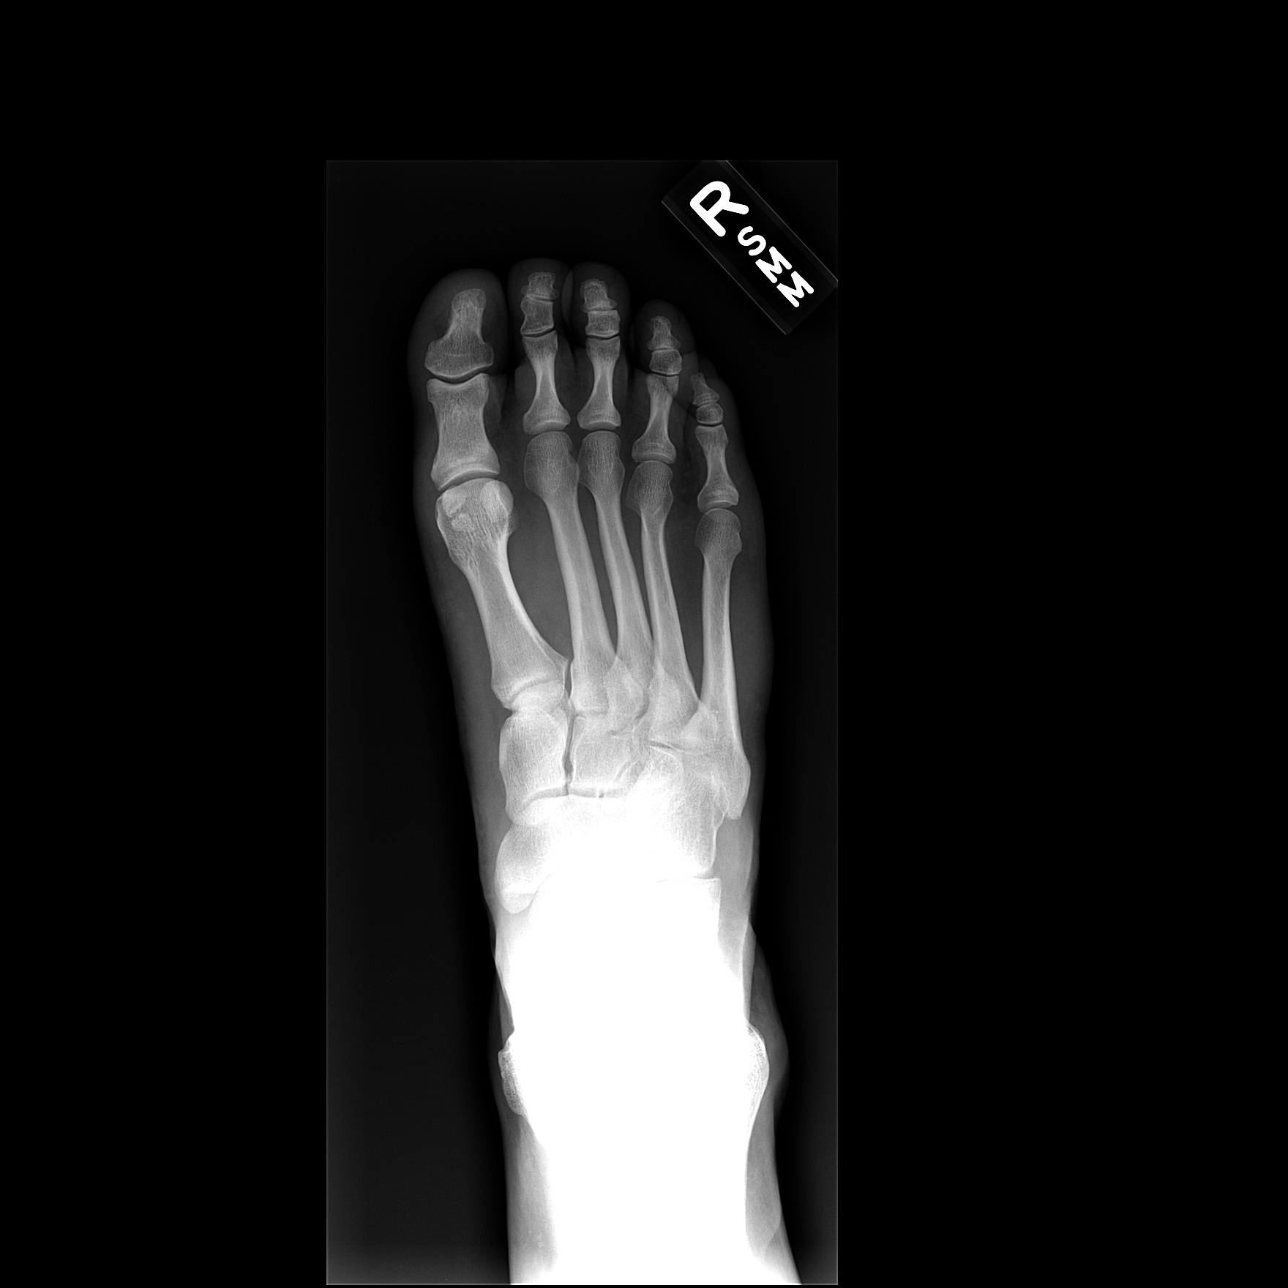

[view not recorded (2 of 3)]
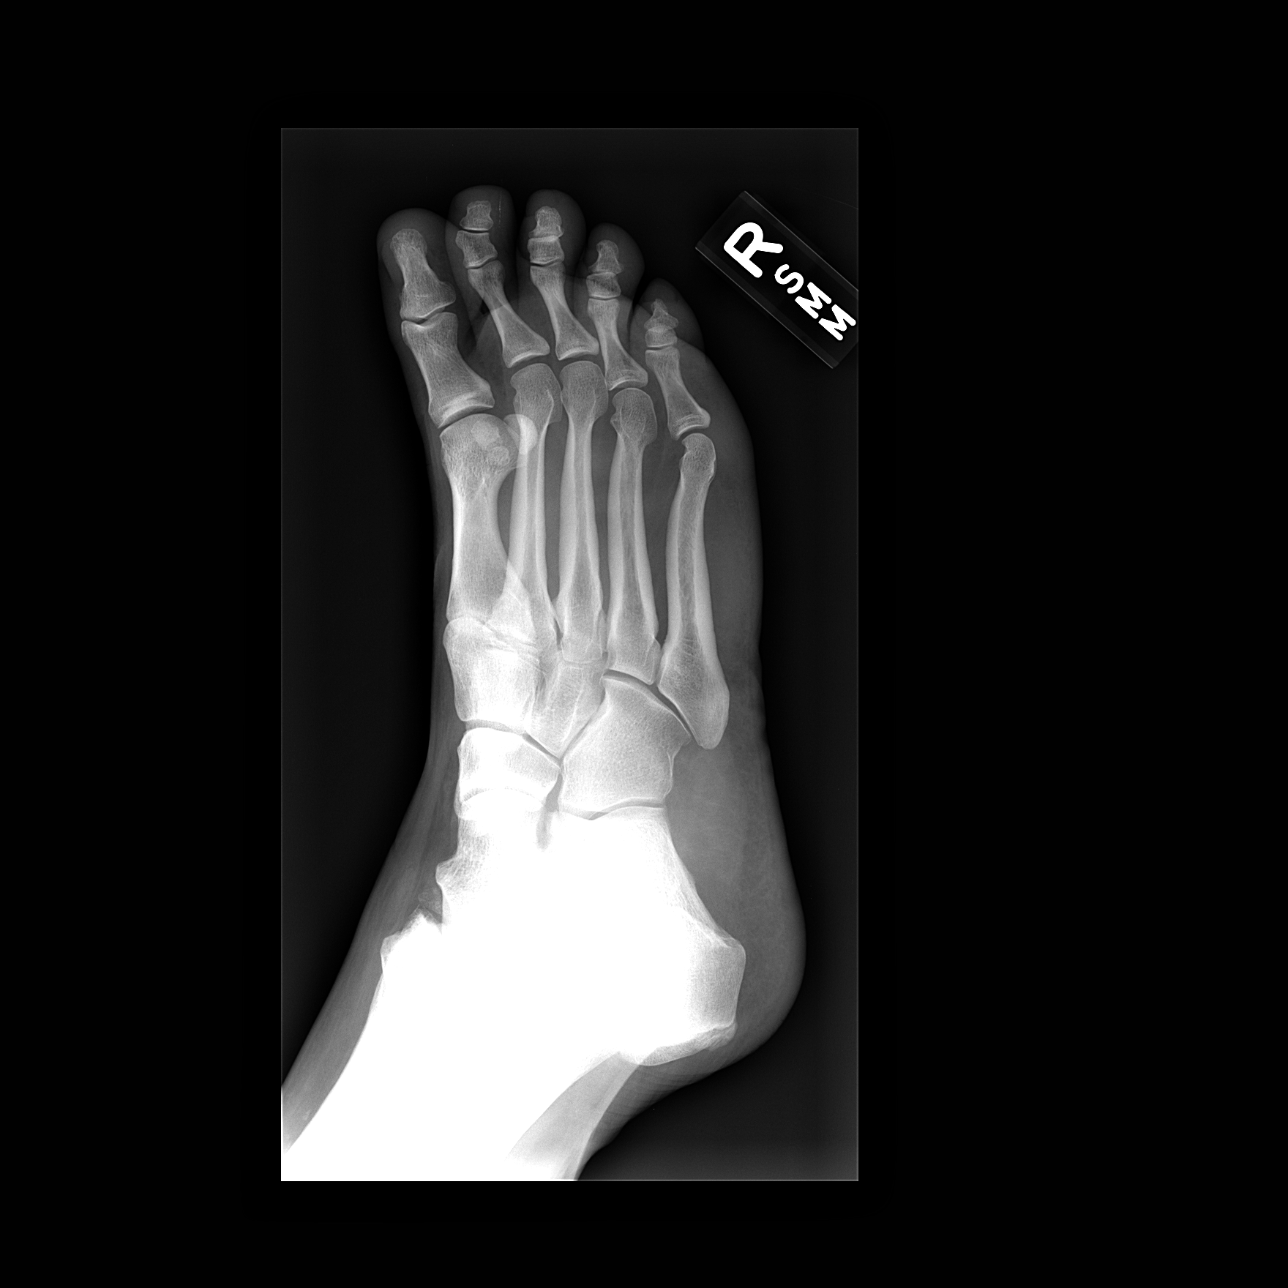

[view not recorded (3 of 3)]
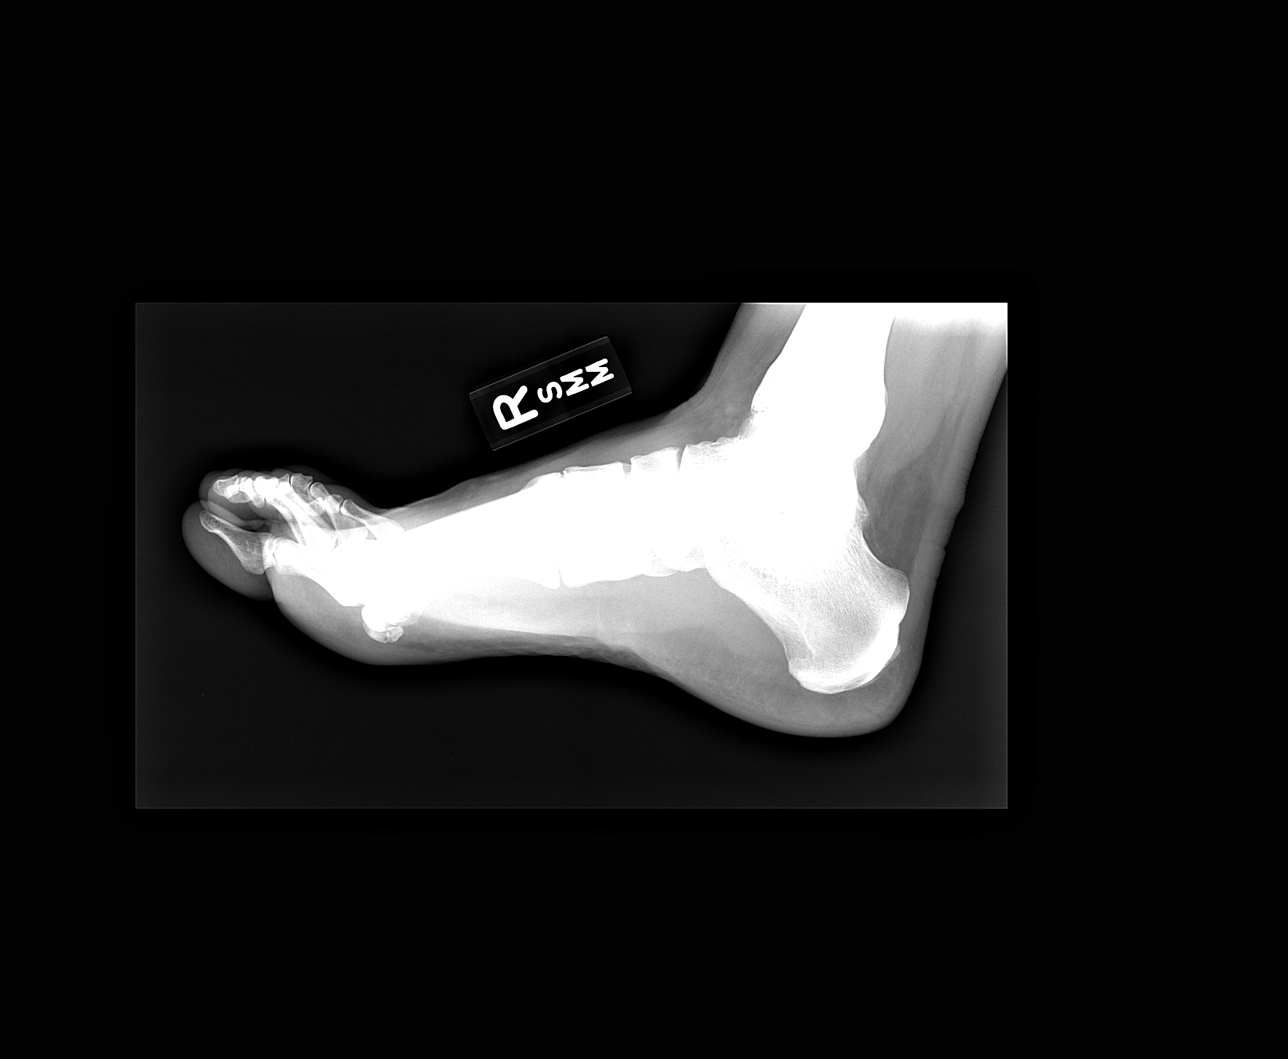

[3 of 3 positions shown; findings below may reference images not displayed]

FINDINGS: Frontal, oblique, and lateral views were obtained. There is no
fracture or dislocation. Joint spaces appear intact. No erosive
change.
IMPRESSION: No abnormality noted.

## 2016-04-06 ENCOUNTER — Other Ambulatory Visit: Payer: Self-pay

## 2016-04-06 VITALS — BP 118/76 | HR 72 | Resp 16 | Ht 71.0 in | Wt 229.4 lb

## 2016-04-06 DIAGNOSIS — E119 Type 2 diabetes mellitus without complications: Secondary | ICD-10-CM

## 2016-04-06 NOTE — Patient Instructions (Signed)
1. Plan to check blood sugar every day: fasting and 1 -2 hours after eating with goals of 80-130 and less than 180 after eating. 2. Plan to exercise 2 times a week at the gym for 60 minutes and do home videos 3 days a week for 30 minutes 3. Plan to eat breakfast  with protein every day 4. Plan to take lunch to work four days a week 5. Plan to call to schedule dental appointment 6. Plan [facility]  7. Plan to get a Flu shot 8. Plan to schedule appointment with Dr. Jonny RuizJohn in January  9. Plan to be [facility] by [facility]

## 2016-04-06 NOTE — Patient Outreach (Signed)
[facility]) Care Management   04/06/2016  YONG GRIESER 1970-08-03 [id number]  [name] is an 45 y.o. male.   Member seen for follow up office visit for Link to Wellness program for self management of Type 2 diabetes  Subjective:   Member states that his blood sugars have been up since he has been on vacation a few weeks ago.  States that he stopped following the 21 day fix program and has not been exercising since he got back.  States that he does get at least 3-5 miles of walking a day at work.  States that he and his wife plan to restart the program soon.  States that he feels much better when he is eating better and exercising.  States he is still taking his lunch most days to work and avoiding fast food.  States he has not gotten his flu shot yet.  States he is to go back to see Dr. Jenny Reichmann in January.  Objective:   Review of Systems  Musculoskeletal: Positive for back pain.  All other systems reviewed and are negative.  Reviewed glucometer 7 day average-177 14 day average-182 30 day average-172 Physical Exam Today's Vitals   04/06/16 0904 04/06/16 0910  BP: 118/76   Pulse: 72   Resp: 16   SpO2: 97%   Weight: 229 lb 6.4 oz (104.1 kg)   Height: 1.803 m ('5\' 11"' )   PainSc: 0-No pain 0-No pain   Encounter Medications:   Outpatient Encounter Prescriptions as of 04/06/2016  Medication Sig  . Alcohol Swabs (B-D SINGLE USE SWABS REGULAR) PADS Use as directed  . aspirin 81 MG tablet Take 81 mg by mouth daily.    . Blood Glucose Monitoring Suppl (TRUERESULT BLOOD GLUCOSE) W/DEVICE KIT by Does not apply route. Reported on 10/02/2015  . Lancet Devices MISC by Does not apply route.  . metFORMIN (GLUCOPHAGE-XR) 500 MG 24 hr tablet Take 1 by mouth once a day.  . sitaGLIPtin (JANUVIA) 100 MG tablet Take 1 tablet (100 mg total) by mouth daily.  . TRUE METRIX BLOOD GLUCOSE TEST test strip USE TO CHECK BLOOD SUGAR ONCE A DAY AS DIRECTED  . TRUEPLUS LANCETS 30G MISC USE AS  DIRECTED ONCE DAILY  . doxycycline (VIBRAMYCIN) 100 MG capsule Take 1 capsule (100 mg total) by mouth 2 (two) times daily. (Patient not taking: Reported on 04/06/2016)  . NON FORMULARY Reported on 10/02/2015   No facility-administered encounter medications on file as of 04/06/2016.     Functional Status:   In your present state of health, do you have any difficulty performing the following activities: 04/06/2016 01/01/2016  Hearing? N N  Vision? N N  Difficulty concentrating or making decisions? N N  Walking or climbing stairs? N -  Dressing or bathing? N N  Doing errands, shopping? N N  Some recent data might be hidden    Fall/Depression Screening:    PHQ 2/9 Scores 04/06/2016 01/01/2016 10/02/2015 06/04/2015 05/29/2015 02/07/2015 04/18/2014  PHQ - 2 Score 0 0 0 0 0 0 0    Assessment:  Member seen for follow up office visit for Link to Wellness program for self management of Type 2 diabetes. Member is meeting diabetes self management goal of 7% or less with last reading of 6.5%.   Member has not been adhering to diet and exercise recommendation for the last month.  CBGs range 118-300.  Member is due to dental checkup.  Member saw podiatrist and had ingrown nail  removed.  Member has not gotten annual flu vaccination.   Plan:  Plan to check blood sugar every day: fasting and 1 -2 hours after eating with goals of 80-130 and less than 180 after eating. Plan to exercise 2 times a week at the gym for 60 minutes and do home videos 3 days a week for 30 minutes Plan to eat breakfast  with protein every day Plan to take lunch to work four days a week Plan to call to schedule dental appointment Plan to enroll in the [place] program  Plan to get a Flu shot Plan to schedule appointment with Dr. Jenny Reichmann in January  Plan to be followed by [employer] program  [facility] CM Care Plan Problem One   Flowsheet Row Most Recent Value  Care Plan Problem One  Elevated blood sugars as evidenced by hemoglobin A1C of  7.4% related to dx of type 2 DM   Role Documenting the Problem One  Care Management Coordinator  Care Plan for Problem One  Active  [facility] Long Term Goal (31-90 days)  Member will decrease hemoglobin A1C to 7 or below in the next 90 days  [facility] Long Term Goal Start Date  04/06/16  Interventions for Problem One Long Term Goal   Reinforced CHO counting and protein control, Encouraged to get back on diet and exercise program with his wife, Reinforced to continue to take his lunch 4 times a week ,  Instructed to call MD and  dentist to schedule  appointments, Instructed on the importance of getting annual flu shot and instructed to call provider to get shot,  Reinforced importance of exercise to glycemic control , Reinforced blood sugar goals fasting and after meals, Given handout on the [facility] program and instructed to enroll in the program     Peter Garter RN, [facility] Care Management Coordinator-Link to [place] Management [phone]

## 2016-05-11 ENCOUNTER — Other Ambulatory Visit: Payer: Self-pay | Admitting: Internal Medicine

## 2016-05-11 MED FILL — METFORMIN HCL ER 500 MG TAB: 500 | 90 days supply | Qty: 90 | Fill #2

## 2016-05-12 MED FILL — JANUVIA 100 MG TABLET: 100 | 30 days supply | Qty: 30 | Fill #0

## 2016-06-29 ENCOUNTER — Telehealth: Payer: Self-pay | Admitting: Internal Medicine

## 2016-06-29 NOTE — Telephone Encounter (Signed)
Patient Name: [name]  DOB: 09/11/1970    Initial Comment Caller says, head cold for a few days, blood sugar is 285-322 for the last few days, today it was 285. No Sx    Nurse Assessment  Nurse: Stefano GaulStringer, RN, Dwana CurdVera Date/Time (Eastern Time): 06/29/2016 10:03:30 AM  Confirm and document reason for call. If symptomatic, describe symptoms. ---Caller states he has sinus congestion since Friday No fever. he is coughing and sneezing. Blood sugar has been running 285-322. He is tired. he takes metformin and [place].  Does the patient have any new or worsening symptoms? ---Yes  Will a triage be completed? ---Yes  Related visit to physician within the last 2 weeks? ---No  Does the PT have any chronic conditions? (i.e. diabetes, asthma, etc.) ---Yes  List chronic conditions. ---diabetes  Is this a behavioral health or substance abuse call? ---No     Guidelines    Guideline Title Affirmed Question Affirmed Notes  Diabetes - High Blood Sugar Caller has NON-URGENT medication question about med that PCP prescribed and triager unable to answer question    Final Disposition User   Call PCP within 24 Hours [place], RN, Vera    Comments  Dr. Jonny RuizJohn is not available this week and pt does not want to make appt. Pt has had a cold with congestion and cough. no fever. Blood sugar has been between 285-322. He takes metformin and Januvia and wants to know if he needs to take more medication. Please call pt back regarding medication.   Referrals  REFERRED TO PCP OFFICE   Disagree/Comply: Comply

## 2016-07-15 ENCOUNTER — Other Ambulatory Visit: Payer: Self-pay | Admitting: *Deleted

## 2016-07-15 MED ORDER — SITAGLIPTIN PHOSPHATE 100 MG PO TABS: 100.0000 mg | ORAL_TABLET | Freq: Every day | ORAL | 0 refills | Status: DC

## 2016-07-15 MED ORDER — METFORMIN HCL ER 500 MG PO TB24: ORAL_TABLET | ORAL | 0 refills | Status: DC

## 2016-07-15 MED FILL — JANUVIA 100 MG TABLET: 100 | 30 days supply | Qty: 30 | Fill #0

## 2016-08-05 ENCOUNTER — Telehealth: Payer: Self-pay | Admitting: *Deleted

## 2016-08-05 MED ORDER — METFORMIN HCL ER 500 MG PO TB24: ORAL_TABLET | ORAL | 0 refills | Status: DC

## 2016-08-05 MED ORDER — SITAGLIPTIN PHOSPHATE 100 MG PO TABS: 100.0000 mg | ORAL_TABLET | Freq: Every day | ORAL | 0 refills | Status: DC

## 2016-08-05 MED FILL — METFORMIN HCL ER 500 MG TAB: 500 | 30 days supply | Qty: 30 | Fill #0

## 2016-08-05 NOTE — Telephone Encounter (Signed)
Pt left msg on triage stating he has an appt 4/10, but will be out of medications Sat. Requesting enough meds to be called in until appt. Called pt back LMOM [facility]...Raechel Chute/lmb

## 2016-08-10 MED FILL — JANUVIA 100 MG TABLET: 100 | 30 days supply | Qty: 30 | Fill #0

## 2016-09-01 ENCOUNTER — Other Ambulatory Visit (INDEPENDENT_AMBULATORY_CARE_PROVIDER_SITE_OTHER): Payer: 59

## 2016-09-01 ENCOUNTER — Encounter: Payer: Self-pay | Admitting: Internal Medicine

## 2016-09-01 ENCOUNTER — Ambulatory Visit (INDEPENDENT_AMBULATORY_CARE_PROVIDER_SITE_OTHER): Payer: 59 | Admitting: Internal Medicine

## 2016-09-01 VITALS — BP 102/64 | HR 80 | Temp 98.0°F | Ht 71.0 in | Wt 227.0 lb

## 2016-09-01 DIAGNOSIS — E119 Type 2 diabetes mellitus without complications: Secondary | ICD-10-CM

## 2016-09-01 DIAGNOSIS — Z0000 Encounter for general adult medical examination without abnormal findings: Secondary | ICD-10-CM | POA: Diagnosis not present

## 2016-09-01 DIAGNOSIS — R7989 Other specified abnormal findings of blood chemistry: Secondary | ICD-10-CM

## 2016-09-01 DIAGNOSIS — Z23 Encounter for immunization: Secondary | ICD-10-CM | POA: Diagnosis not present

## 2016-09-01 LAB — CBC WITH DIFFERENTIAL/PLATELET
BASOS ABS: 0.1 10*3/uL (ref 0.0–0.1)
Basophils Relative: 0.9 % (ref 0.0–3.0)
EOS ABS: 0.3 10*3/uL (ref 0.0–0.7)
Eosinophils Relative: 3.2 % (ref 0.0–5.0)
HEMATOCRIT: 48.8 % (ref 39.0–52.0)
HEMOGLOBIN: 16.6 g/dL (ref 13.0–17.0)
LYMPHS PCT: 29.4 % (ref 12.0–46.0)
Lymphs Abs: 2.5 10*3/uL (ref 0.7–4.0)
MCHC: 34.1 g/dL (ref 30.0–36.0)
MCV: 90.5 fl (ref 78.0–100.0)
Monocytes Absolute: 0.7 10*3/uL (ref 0.1–1.0)
Monocytes Relative: 8.3 % (ref 3.0–12.0)
Neutro Abs: 4.9 10*3/uL (ref 1.4–7.7)
Neutrophils Relative %: 58.2 % (ref 43.0–77.0)
Platelets: 192 10*3/uL (ref 150.0–400.0)
RBC: 5.39 Mil/uL (ref 4.22–5.81)
RDW: 14 % (ref 11.5–15.5)
WBC: 8.5 10*3/uL (ref 4.0–10.5)

## 2016-09-01 LAB — LIPID PANEL
CHOLESTEROL: 134 mg/dL (ref 0–200)
HDL: 38.5 mg/dL — ABNORMAL LOW (ref >39.00)
NonHDL: 95.16
Total CHOL/HDL Ratio: 3
Triglycerides: 235 mg/dL — ABNORMAL HIGH (ref 0.0–149.0)
VLDL: 47 mg/dL — AB (ref 0.0–40.0)

## 2016-09-01 LAB — MICROALBUMIN / CREATININE URINE RATIO
CREATININE, U: 217.9 mg/dL
MICROALB UR: 0.7 mg/dL (ref 0.0–1.9)
Microalb Creat Ratio: 0.3 mg/g (ref 0.0–30.0)

## 2016-09-01 LAB — BASIC METABOLIC PANEL
BUN: 15 mg/dL (ref 6–23)
CHLORIDE: 103 meq/L (ref 96–112)
CO2: 29 mEq/L (ref 19–32)
Calcium: 9.7 mg/dL (ref 8.4–10.5)
Creatinine, Ser: 1.22 mg/dL (ref 0.40–1.50)
GFR: 67.92 mL/min (ref >60.00)
GLUCOSE: 182 mg/dL — AB (ref 70–99)
POTASSIUM: 4.3 meq/L (ref 3.5–5.1)
SODIUM: 138 meq/L (ref 135–145)

## 2016-09-01 LAB — URINALYSIS, ROUTINE W REFLEX MICROSCOPIC
BILIRUBIN URINE: NEGATIVE
KETONES UR: NEGATIVE
LEUKOCYTES UA: NEGATIVE
NITRITE: NEGATIVE
Total Protein, Urine: NEGATIVE
URINE GLUCOSE: NEGATIVE
UROBILINOGEN UA: 0.2 (ref 0.0–1.0)
pH: 5.5 (ref 5.0–8.0)

## 2016-09-01 LAB — TSH: TSH: 2.12 u[IU]/mL (ref 0.35–4.50)

## 2016-09-01 LAB — HEPATIC FUNCTION PANEL
ALBUMIN: 4.4 g/dL (ref 3.5–5.2)
ALK PHOS: 60 U/L (ref 39–117)
ALT: 24 U/L (ref 0–53)
AST: 19 U/L (ref 0–37)
BILIRUBIN TOTAL: 0.6 mg/dL (ref 0.2–1.2)
Bilirubin, Direct: 0.1 mg/dL (ref 0.0–0.3)
Total Protein: 6.8 g/dL (ref 6.0–8.3)

## 2016-09-01 LAB — PSA: PSA: 0.63 ng/mL (ref 0.10–4.00)

## 2016-09-01 LAB — HEMOGLOBIN A1C: HEMOGLOBIN A1C: 7.2 % — AB (ref 4.6–6.5)

## 2016-09-01 LAB — LDL CHOLESTEROL, DIRECT: Direct LDL: 64 mg/dL

## 2016-09-01 NOTE — Addendum Note (Signed)
Addended by: Roney Mans on: 09/01/2016 03:19 PM   Modules accepted: Orders

## 2016-09-01 NOTE — Assessment & Plan Note (Signed)

## 2016-09-01 NOTE — Progress Notes (Signed)
Pre visit review using our clinic review tool, if applicable. No additional management support is needed unless otherwise documented below in the visit note. 

## 2016-09-01 NOTE — Assessment & Plan Note (Signed)
stable overall by history and exam, recent data reviewed with pt, and pt to continue medical treatment as before,  to f/u any worsening symptoms or concerns Lab Results  Component Value Date   HGBA1C 6.5 12/17/2015   fo f/u lab

## 2016-09-01 NOTE — Progress Notes (Signed)
Subjective:    Patient ID: [name], male    DOB: Jan 01, 1971, 46 y.o.   MRN: [id number]  HPI  Here for wellness and f/u;  Overall doing ok;  Pt denies Chest pain, worsening SOB, DOE, wheezing, orthopnea, PND, worsening LE edema, palpitations, dizziness or syncope.  Pt denies neurological change such as new headache, facial or extremity weakness.  Pt denies polydipsia, polyuria, or low sugar symptoms. Pt states overall good compliance with treatment and medications, good tolerability, and has been trying to follow appropriate diet.  Pt denies worsening depressive symptoms, suicidal ideation or panic. No fever, night sweats, wt loss, loss of appetite, or other constitutional symptoms.  Pt states good ability with ADL's, has low fall risk, home safety reviewed and adequate, no other significant changes in hearing or vision, and only occasionally active with exercise.Plans to get back to the gym bc just cant get enough wt loss with wors.   s/p ingrown nail removal left great toe recently. Has bilat plantar pain sometimes, worse when soles become more worn, but mild to mod, plans to f/u with podiatry. No further hx. Wt Readings from Last 3 Encounters:  09/01/16 227 lb (103 kg)  04/06/16 229 lb 6.4 oz (104.1 kg)  01/01/16 224 lb 1.6 oz (101.7 kg)   Past Medical History:  Diagnosis Date  . ALLERGIC RHINITIS 12/31/2008  . DIABETES MELLITUS, TYPE II 06/03/2009  . Dyslipidemia 10/31/2014  . FOOT PAIN, RIGHT 08/08/2010  . HYPERSOMNIA 11/30/2008  . Lumbar disc disease 03/01/2011  . OBSTRUCTIVE SLEEP APNEA 02/03/2009  . OTHER INFLAMMATORY DISORDER MALE GENITAL ORGANS 11/30/2008   Past Surgical History:  Procedure Laterality Date  . HERNIA REPAIR  2012   [facility]    reports that he has never smoked. He has never used smokeless tobacco. He reports that he drinks alcohol. He reports that he does not use drugs. family history includes Cancer in his other; Coronary artery disease in his father; Depression in his  maternal grandmother and mother; Diabetes in his father; Heart attack in his father; Hypertension in his father. Allergies  Allergen Reactions  . Penicillins     Childhood allergy   Current Outpatient Prescriptions on File Prior to Visit  Medication Sig Dispense Refill  . Alcohol Swabs (B-D SINGLE USE SWABS REGULAR) PADS Use as directed    . aspirin 81 MG tablet Take 81 mg by mouth daily.      . Blood Glucose Monitoring Suppl (TRUERESULT BLOOD GLUCOSE) W/DEVICE KIT by Does not apply route. Reported on 10/02/2015    . Lancet Devices MISC by Does not apply route.    . metFORMIN (GLUCOPHAGE-XR) 500 MG 24 hr tablet Take 1 by mouth once a day. Must keep 09/01/16 appt for future refills 30 tablet 0  . NON FORMULARY Reported on 10/02/2015    . sitaGLIPtin (JANUVIA) 100 MG tablet Take 1 tablet (100 mg total) by mouth daily. Must keep 09/01/16 appt for future refills 30 tablet 0  . TRUE METRIX BLOOD GLUCOSE TEST test strip USE TO CHECK BLOOD SUGAR ONCE A DAY AS DIRECTED 100 each 5  . TRUEPLUS LANCETS 30G MISC USE AS DIRECTED ONCE DAILY 100 each 5   No current facility-administered medications on file prior to visit.    Review of Systems Constitutional: Negative for other unusual diaphoresis, sweats, appetite or weight changes HENT: Negative for other worsening hearing loss, ear pain, facial swelling, mouth sores or neck stiffness.   Eyes: Negative for other worsening pain, redness  or other visual disturbance.  Respiratory: Negative for other stridor or swelling Cardiovascular: Negative for other palpitations or other chest pain  Gastrointestinal: Negative for worsening diarrhea or loose stools, blood in stool, distention or other pain Genitourinary: Negative for hematuria, flank pain or other change in urine volume.  Musculoskeletal: Negative for myalgias or other joint swelling.  Skin: Negative for other color change, or other wound or worsening drainage.  Neurological: Negative for other syncope  or numbness. Hematological: Negative for other adenopathy or swelling Psychiatric/Behavioral: Negative for hallucinations, other worsening agitation, SI, self-injury, or new decreased concentration All other system neg per pt    Objective:   Physical Exam BP 102/64   Pulse 80   Temp 98 F (36.7 C) (Oral)   Ht '5\' 11"'  (1.803 m)   Wt 227 lb (103 kg)   SpO2 99%   BMI 31.66 kg/m . VS noted, obese Constitutional: Pt is oriented to person, place, and time. Appears well-developed and well-nourished, in no significant distress and comfortable Head: Normocephalic and atraumatic  Eyes: Conjunctivae and EOM are normal. Pupils are equal, round, and reactive to light Right Ear: External ear normal without discharge Left Ear: External ear normal without discharge Nose: Nose without discharge or deformity Mouth/Throat: Oropharynx is without other ulcerations and moist  Neck: Normal range of motion. Neck supple. No JVD present. No tracheal deviation present or significant neck LA or mass Cardiovascular: Normal rate, regular rhythm, normal heart sounds and intact distal pulses.   Pulmonary/Chest: WOB normal and breath sounds without rales or wheezing  Abdominal: Soft. Bowel sounds are normal. NT. No HSM  Musculoskeletal: Normal range of motion. Exhibits no edema Lymphadenopathy: Has no other cervical adenopathy.  Neurological: Pt is alert and oriented to person, place, and time. Pt has normal reflexes. No cranial nerve deficit. Motor grossly intact, Gait intact Skin: Skin is warm and dry. No rash noted or new ulcerations Psychiatric:  Has normal mood and affect. Behavior is normal without agitation No other exam findings    Assessment & Plan:

## 2016-09-01 NOTE — Patient Instructions (Addendum)
Please remember to make your yearly eye doctor visit  You had the Pneumovax pneumonia shot today  Please continue all other medications as before, and refills have been done if requested.  Please have the pharmacy call with any other refills you may need.  Please continue your efforts at being more active, low cholesterol diet, and weight control.  You are otherwise up to date with prevention measures today.  Please keep your appointments with your specialists as you may have planned  Please go to the LAB in the Basement (turn left off the elevator) for the tests to be done today  You will be contacted by phone if any changes need to be made immediately.  Otherwise, you will receive a letter about your results with an explanation, but please check with MyChart first.  Please remember to sign up for MyChart if you have not done so, as this will be important to you in the future with finding out test results, communicating by private email, and scheduling acute appointments online when needed.  Please return in 6 months, or sooner if needed, with Lab testing done 3-5 days before

## 2016-09-03 ENCOUNTER — Telehealth: Payer: Self-pay | Admitting: Internal Medicine

## 2016-09-03 NOTE — Telephone Encounter (Signed)
Ok to call pt to ask about fever or worsening redness/swelling or pain, , as an antibiotic could be done if he is getting worse

## 2016-09-03 NOTE — Telephone Encounter (Signed)
Patient Name: [name]  DOB: [birth year]    Initial Comment Caller states that he had a pneumonia preventive shot and it is warm to the touch, swollen, and painful.   Nurse Assessment  Nurse: Stefano Gaul, RN, Dwana Curd Date/Time (Eastern Time): 09/03/2016 11:19:24 AM  Confirm and document reason for call. If symptomatic, describe symptoms. ---Caller states he had pneumonia immunization on Tuesday afternoon. Yesterday it was sore and had some swelling. Today it is still swollen, red and tender to the touch. Swollen from right below the shoulder to his elbow. No fever or chills.  Does the patient have any new or worsening symptoms? ---Yes  Will a triage be completed? ---Yes  Related visit to physician within the last 2 weeks? ---No  Does the PT have any chronic conditions? (i.e. diabetes, asthma, etc.) ---Yes  List chronic conditions. ---diabetes  Is this a behavioral health or substance abuse call? ---No     Guidelines    Guideline Title Affirmed Question Affirmed Notes  Immunization Reactions Pneumococcal vaccine reactions    Final Disposition User   [facility] [place], RN, Dwana Curd    Disagree/Comply: Comply

## 2016-09-03 NOTE — Telephone Encounter (Signed)
Spoke with pt. He said that it is still some redness however the RN he spoke to suggested waiting until tomorrow to see how it feels. I told him Dr. Jonny Ruiz is more than happy to call in the antibiotic if he so chooses to want that. Pt stated that he will see how it goes over night and call back tomorrow.

## 2016-09-09 ENCOUNTER — Other Ambulatory Visit: Payer: Self-pay | Admitting: Internal Medicine

## 2016-09-09 MED FILL — JANUVIA 100 MG TABLET: 100 | 90 days supply | Qty: 90 | Fill #0

## 2016-09-09 MED FILL — METFORMIN HCL ER 500 MG TAB: 500 | 30 days supply | Qty: 30 | Fill #0

## 2016-10-08 ENCOUNTER — Other Ambulatory Visit: Payer: Self-pay | Admitting: Internal Medicine

## 2016-10-08 MED FILL — METFORMIN HCL ER 500 MG TAB: 500 | 90 days supply | Qty: 90 | Fill #0

## 2016-12-07 ENCOUNTER — Other Ambulatory Visit: Payer: Self-pay | Admitting: Internal Medicine

## 2016-12-07 MED FILL — JANUVIA 100 MG TABLET: 100 | 90 days supply | Qty: 90 | Fill #1

## 2016-12-08 ENCOUNTER — Other Ambulatory Visit: Payer: Self-pay | Admitting: Internal Medicine

## 2016-12-08 MED ORDER — BLOOD GLUCOSE METER KIT: PACK | 0 refills | Status: DC

## 2016-12-08 MED FILL — ACCU-CHEK GUIDE TEST STRIP: 25 days supply | Qty: 100 | Fill #0

## 2016-12-08 MED FILL — ACCU-CHEK FASTCLIX LANCETS: 26 days supply | Qty: 102 | Fill #0

## 2016-12-31 MED FILL — METFORMIN HCL ER 500 MG TAB: 500 | 90 days supply | Qty: 90 | Fill #1

## 2017-03-05 ENCOUNTER — Ambulatory Visit (INDEPENDENT_AMBULATORY_CARE_PROVIDER_SITE_OTHER): Payer: 59 | Admitting: Internal Medicine

## 2017-03-05 ENCOUNTER — Encounter: Payer: Self-pay | Admitting: Internal Medicine

## 2017-03-05 ENCOUNTER — Other Ambulatory Visit (INDEPENDENT_AMBULATORY_CARE_PROVIDER_SITE_OTHER): Payer: 59

## 2017-03-05 VITALS — BP 120/78 | HR 85 | Temp 98.0°F | Ht 71.0 in | Wt 224.0 lb

## 2017-03-05 DIAGNOSIS — Z0000 Encounter for general adult medical examination without abnormal findings: Secondary | ICD-10-CM

## 2017-03-05 DIAGNOSIS — Z23 Encounter for immunization: Secondary | ICD-10-CM

## 2017-03-05 DIAGNOSIS — E785 Hyperlipidemia, unspecified: Secondary | ICD-10-CM | POA: Diagnosis not present

## 2017-03-05 DIAGNOSIS — E119 Type 2 diabetes mellitus without complications: Secondary | ICD-10-CM

## 2017-03-05 DIAGNOSIS — E669 Obesity, unspecified: Secondary | ICD-10-CM

## 2017-03-05 LAB — LIPID PANEL
CHOL/HDL RATIO: 4
Cholesterol: 140 mg/dL (ref 0–200)
HDL: 38.2 mg/dL — AB (ref >39.00)
NONHDL: 101.92
TRIGLYCERIDES: 280 mg/dL — AB (ref 0.0–149.0)
VLDL: 56 mg/dL — ABNORMAL HIGH (ref 0.0–40.0)

## 2017-03-05 LAB — HEPATIC FUNCTION PANEL
ALT: 26 U/L (ref 0–53)
AST: 18 U/L (ref 0–37)
Albumin: 4.4 g/dL (ref 3.5–5.2)
Alkaline Phosphatase: 56 U/L (ref 39–117)
BILIRUBIN DIRECT: 0.1 mg/dL (ref 0.0–0.3)
BILIRUBIN TOTAL: 0.5 mg/dL (ref 0.2–1.2)
Total Protein: 6.9 g/dL (ref 6.0–8.3)

## 2017-03-05 LAB — BASIC METABOLIC PANEL
BUN: 14 mg/dL (ref 6–23)
CALCIUM: 9 mg/dL (ref 8.4–10.5)
CO2: 31 mEq/L (ref 19–32)
CREATININE: 1.16 mg/dL (ref 0.40–1.50)
Chloride: 101 mEq/L (ref 96–112)
GFR: 71.83 mL/min (ref >60.00)
Glucose, Bld: 153 mg/dL — ABNORMAL HIGH (ref 70–99)
Potassium: 3.9 mEq/L (ref 3.5–5.1)
Sodium: 139 mEq/L (ref 135–145)

## 2017-03-05 LAB — HEMOGLOBIN A1C: Hgb A1c MFr Bld: 7.6 % — ABNORMAL HIGH (ref 4.6–6.5)

## 2017-03-05 LAB — LDL CHOLESTEROL, DIRECT: Direct LDL: 70 mg/dL

## 2017-03-05 NOTE — Progress Notes (Signed)
Subjective:    Patient ID: [name], male    DOB: 1971-05-22, 46 y.o.   MRN: [id number]  HPI  Here to f/u; overall doing ok,  Pt denies chest pain, increasing sob or doe, wheezing, orthopnea, PND, increased LE swelling, palpitations, dizziness or syncope.  Pt denies new neurological symptoms such as new headache, or facial or extremity weakness or numbness.  Pt denies polydipsia, polyuria, or low sugar episode.  Pt states overall good compliance with meds, mostly trying to follow appropriate diet, with wt overall stable,  but little exercise however  Lost wt with reduced calories, increased work. No new complaints or interval hx Wt Readings from Last 3 Encounters:  03/05/17 224 lb (101.6 kg)  09/01/16 227 lb (103 kg)  04/06/16 229 lb 6.4 oz (104.1 kg)   Past Medical History:  Diagnosis Date  . ALLERGIC RHINITIS 12/31/2008  . DIABETES MELLITUS, TYPE II 06/03/2009  . Dyslipidemia 10/31/2014  . FOOT PAIN, RIGHT 08/08/2010  . HYPERSOMNIA 11/30/2008  . Lumbar disc disease 03/01/2011  . OBSTRUCTIVE SLEEP APNEA 02/03/2009  . OTHER INFLAMMATORY DISORDER MALE GENITAL ORGANS 11/30/2008   Past Surgical History:  Procedure Laterality Date  . HERNIA REPAIR  2012   [facility]    reports that he has never smoked. He has never used smokeless tobacco. He reports that he drinks alcohol. He reports that he does not use drugs. family history includes Cancer in his other; Coronary artery disease in his father; Depression in his maternal grandmother and mother; Diabetes in his father; Heart attack in his father; Hypertension in his father. Allergies  Allergen Reactions  . Penicillins     Childhood allergy   Current Outpatient Prescriptions on File Prior to Visit  Medication Sig Dispense Refill  . Alcohol Swabs (B-D SINGLE USE SWABS REGULAR) PADS Use as directed    . aspirin 81 MG tablet Take 81 mg by mouth daily.      . blood glucose meter kit and supplies Dispense based on patient and insurance preference.  Use up to four times daily as directed. **ICD10-E11.9** 1 each 0  . Blood Glucose Monitoring Suppl (TRUERESULT BLOOD GLUCOSE) W/DEVICE KIT by Does not apply route. Reported on 10/02/2015    . Lancet Devices MISC by Does not apply route.    . metFORMIN (GLUCOPHAGE-XR) 500 MG 24 hr tablet Take 1 by mouth once a day. Must keep 09/01/16 appt for future refills 30 tablet 0  . metFORMIN (GLUCOPHAGE-XR) 500 MG 24 hr tablet TAKE 1 BY MOUTH ONCE A DAY. 90 tablet 2  . NON FORMULARY Reported on 10/02/2015    . sitaGLIPtin (JANUVIA) 100 MG tablet Take 1 tablet (100 mg total) by mouth daily. 90 tablet 3  . TRUE METRIX BLOOD GLUCOSE TEST test strip USE TO CHECK BLOOD SUGAR ONCE A DAY AS DIRECTED 100 each 5  . TRUEPLUS LANCETS 30G MISC USE AS DIRECTED ONCE DAILY 100 each 5   No current facility-administered medications on file prior to visit.    Review of Systems  Constitutional: Negative for other unusual diaphoresis or sweats HENT: Negative for ear discharge or swelling Eyes: Negative for other worsening visual disturbances Respiratory: Negative for stridor or other swelling  Gastrointestinal: Negative for worsening distension or other blood Genitourinary: Negative for retention or other urinary change Musculoskeletal: Negative for other MSK pain or swelling Skin: Negative for color change or other new lesions Neurological: Negative for worsening tremors and other numbness  Psychiatric/Behavioral: Negative for worsening agitation or other  fatigue All other system neg per pt    Objective:   Physical Exam BP 120/78   Pulse 85   Temp 98 F (36.7 C) (Oral)   Ht 5' 11" (1.803 m)   Wt 224 lb (101.6 kg)   SpO2 99%   BMI 31.24 kg/m  VS noted,  Constitutional: Pt appears in NAD HENT: Head: NCAT.  Right Ear: External ear normal.  Left Ear: External ear normal.  Eyes: . Pupils are equal, round, and reactive to light. Conjunctivae and EOM are normal Nose: without d/c or deformity Neck: Neck supple.  Gross normal ROM Cardiovascular: Normal rate and regular rhythm.   Pulmonary/Chest: Effort normal and breath sounds without rales or wheezing.  Abd:  Soft, NT, ND, + BS, no organomegaly Neurological: Pt is alert. At baseline orientation, motor grossly intact Skin: Skin is warm. No rashes, other new lesions, no LE edema Psychiatric: Pt behavior is normal without agitation  No other exam findings Lab Results  Component Value Date   WBC 8.5 09/01/2016   HGB 16.6 09/01/2016   HCT 48.8 09/01/2016   PLT 192.0 09/01/2016   GLUCOSE 182 (H) 09/01/2016   CHOL 134 09/01/2016   TRIG 235.0 (H) 09/01/2016   HDL 38.50 (L) 09/01/2016   LDLDIRECT 64.0 09/01/2016   LDLCALC 59 10/31/2014   ALT 24 09/01/2016   AST 19 09/01/2016   NA 138 09/01/2016   K 4.3 09/01/2016   CL 103 09/01/2016   CREATININE 1.22 09/01/2016   BUN 15 09/01/2016   CO2 29 09/01/2016   TSH 2.12 09/01/2016   PSA 0.63 09/01/2016   HGBA1C 7.2 (H) 09/01/2016   MICROALBUR 0.7 09/01/2016       Assessment & Plan:

## 2017-03-05 NOTE — Assessment & Plan Note (Signed)
Some improved, cont wt loss, diet and increased activity

## 2017-03-05 NOTE — Assessment & Plan Note (Signed)
stable overall by history and exam, recent data reviewed with pt, and pt to continue medical treatment as before,  to f/u any worsening symptoms or concerns, for f/u labs today 

## 2017-03-05 NOTE — Patient Instructions (Signed)

## 2017-03-05 NOTE — Assessment & Plan Note (Signed)
stable overall by history and exam, recent data reviewed with pt, and pt to continue medical treatment as before,  to f/u any worsening symptoms or concerns BP Readings from Last 3 Encounters:  03/05/17 120/78  09/01/16 102/64  04/06/16 118/76

## 2017-03-08 ENCOUNTER — Encounter: Payer: Self-pay | Admitting: Internal Medicine

## 2017-03-08 ENCOUNTER — Telehealth: Payer: Self-pay

## 2017-03-08 ENCOUNTER — Other Ambulatory Visit: Payer: Self-pay | Admitting: Internal Medicine

## 2017-03-08 MED ORDER — METFORMIN HCL ER 500 MG PO TB24: 1000.0000 mg | ORAL_TABLET | Freq: Every day | ORAL | 3 refills | Status: DC

## 2017-03-08 NOTE — Telephone Encounter (Signed)
Called pt, LVM.   

## 2017-03-08 NOTE — Telephone Encounter (Signed)
-----   Message from Corwin Levins, MD sent at 03/08/2017 12:05 PM EDT ----- Letter sent, cont same tx except  The test results show that your current treatment is OK, except the A1c is still mildly high, and slightly worse than last visit.  We need to increase the metformin pill to 2 pills in the AM.  I will send a new prescription, and you should be notified from the office as well.      [name] to please inform pt, I will do rx

## 2017-03-10 ENCOUNTER — Other Ambulatory Visit: Payer: Self-pay | Admitting: Internal Medicine

## 2017-03-10 MED ORDER — METFORMIN HCL ER 500 MG PO TB24: 1000.0000 mg | ORAL_TABLET | Freq: Every day | ORAL | 3 refills | Status: DC

## 2017-03-10 MED FILL — JANUVIA 100 MG TABLET: 100 | 90 days supply | Qty: 90 | Fill #2

## 2017-03-10 MED FILL — ACCU-CHEK GUIDE TEST STRIP: 25 days supply | Qty: 100 | Fill #0

## 2017-03-10 MED FILL — METFORMIN HCL ER 500 MG TAB: 500 | 90 days supply | Qty: 180 | Fill #0

## 2017-03-10 MED FILL — ACCU-CHEK FASTCLIX LANCETS: 26 days supply | Qty: 102 | Fill #0

## 2017-03-10 NOTE — Telephone Encounter (Signed)
Pt informed of test results, expressed understanding  He would like his metFORMIN (GLUCOPHAGE-XR) 500 MG 24 hr tablet  [facility]

## 2017-03-10 NOTE — Addendum Note (Signed)
Addended by: Roney MansGAY, Kylar Speelman on: 03/10/2017 02:53 PM   Modules accepted: Orders

## 2017-05-03 ENCOUNTER — Other Ambulatory Visit: Payer: Self-pay

## 2017-05-03 NOTE — Patient Outreach (Signed)
[facility] Care Management  05/03/2017  [name] 09/15/1970 [id number]   Case closed in Epic.  Member is being followed by the [employer] program Member enrolled in an external program. Dudley MajorMelissa Raine Blodgett RN, [facility],CCM Care Management Coordinator-Link to [facility] [phone]

## 2017-06-09 MED FILL — METFORMIN HCL ER 500 MG TAB: 500 | 90 days supply | Qty: 180 | Fill #1

## 2017-06-09 MED FILL — JANUVIA 100 MG TABLET: 100 | 90 days supply | Qty: 90 | Fill #3

## 2017-09-03 ENCOUNTER — Ambulatory Visit: Payer: 59 | Admitting: Internal Medicine

## 2017-09-06 ENCOUNTER — Other Ambulatory Visit: Payer: Self-pay | Admitting: Internal Medicine

## 2017-09-06 MED FILL — JANUVIA 100 MG TABLET: 100 | 90 days supply | Qty: 90 | Fill #0

## 2017-09-06 MED FILL — METFORMIN HCL ER 500 MG TAB: 500 | 90 days supply | Qty: 180 | Fill #2

## 2017-09-07 ENCOUNTER — Ambulatory Visit (INDEPENDENT_AMBULATORY_CARE_PROVIDER_SITE_OTHER): Payer: No Typology Code available for payment source | Admitting: Internal Medicine

## 2017-09-07 ENCOUNTER — Other Ambulatory Visit (INDEPENDENT_AMBULATORY_CARE_PROVIDER_SITE_OTHER): Payer: No Typology Code available for payment source

## 2017-09-07 ENCOUNTER — Encounter: Payer: Self-pay | Admitting: Internal Medicine

## 2017-09-07 ENCOUNTER — Other Ambulatory Visit: Payer: Self-pay | Admitting: Internal Medicine

## 2017-09-07 ENCOUNTER — Telehealth: Payer: Self-pay

## 2017-09-07 VITALS — BP 126/84 | HR 65 | Temp 97.8°F | Ht 71.0 in | Wt 227.0 lb

## 2017-09-07 DIAGNOSIS — Z0000 Encounter for general adult medical examination without abnormal findings: Secondary | ICD-10-CM | POA: Diagnosis not present

## 2017-09-07 DIAGNOSIS — Z114 Encounter for screening for human immunodeficiency virus [HIV]: Secondary | ICD-10-CM | POA: Diagnosis not present

## 2017-09-07 DIAGNOSIS — E119 Type 2 diabetes mellitus without complications: Secondary | ICD-10-CM | POA: Diagnosis not present

## 2017-09-07 LAB — CBC WITH DIFFERENTIAL/PLATELET
BASOS ABS: 0.1 10*3/uL (ref 0.0–0.1)
Basophils Relative: 1.1 % (ref 0.0–3.0)
EOS ABS: 0.2 10*3/uL (ref 0.0–0.7)
Eosinophils Relative: 2.5 % (ref 0.0–5.0)
HEMATOCRIT: 49 % (ref 39.0–52.0)
HEMOGLOBIN: 16.9 g/dL (ref 13.0–17.0)
LYMPHS PCT: 28.3 % (ref 12.0–46.0)
Lymphs Abs: 2.2 10*3/uL (ref 0.7–4.0)
MCHC: 34.4 g/dL (ref 30.0–36.0)
MCV: 89.9 fl (ref 78.0–100.0)
MONOS PCT: 8.4 % (ref 3.0–12.0)
Monocytes Absolute: 0.7 10*3/uL (ref 0.1–1.0)
Neutro Abs: 4.8 10*3/uL (ref 1.4–7.7)
Neutrophils Relative %: 59.7 % (ref 43.0–77.0)
Platelets: 190 10*3/uL (ref 150.0–400.0)
RBC: 5.45 Mil/uL (ref 4.22–5.81)
RDW: 13.6 % (ref 11.5–15.5)
WBC: 8 10*3/uL (ref 4.0–10.5)

## 2017-09-07 LAB — HEPATIC FUNCTION PANEL
ALK PHOS: 63 U/L (ref 39–117)
ALT: 26 U/L (ref 0–53)
AST: 16 U/L (ref 0–37)
Albumin: 4.5 g/dL (ref 3.5–5.2)
BILIRUBIN DIRECT: 0.1 mg/dL (ref 0.0–0.3)
TOTAL PROTEIN: 6.8 g/dL (ref 6.0–8.3)
Total Bilirubin: 0.6 mg/dL (ref 0.2–1.2)

## 2017-09-07 LAB — BASIC METABOLIC PANEL
BUN: 18 mg/dL (ref 6–23)
CALCIUM: 9.4 mg/dL (ref 8.4–10.5)
CHLORIDE: 102 meq/L (ref 96–112)
CO2: 28 mEq/L (ref 19–32)
Creatinine, Ser: 1.09 mg/dL (ref 0.40–1.50)
GFR: 77.01 mL/min (ref >60.00)
Glucose, Bld: 192 mg/dL — ABNORMAL HIGH (ref 70–99)
Potassium: 4.5 mEq/L (ref 3.5–5.1)
Sodium: 138 mEq/L (ref 135–145)

## 2017-09-07 LAB — TSH: TSH: 4.65 u[IU]/mL — ABNORMAL HIGH (ref 0.35–4.50)

## 2017-09-07 LAB — URINALYSIS, ROUTINE W REFLEX MICROSCOPIC
BILIRUBIN URINE: NEGATIVE
HGB URINE DIPSTICK: NEGATIVE
Ketones, ur: NEGATIVE
LEUKOCYTES UA: NEGATIVE
Nitrite: NEGATIVE
PH: 5.5 (ref 5.0–8.0)
Specific Gravity, Urine: 1.025 (ref 1.000–1.030)
Total Protein, Urine: NEGATIVE
UROBILINOGEN UA: 0.2 (ref 0.0–1.0)
Urine Glucose: NEGATIVE

## 2017-09-07 LAB — MICROALBUMIN / CREATININE URINE RATIO
CREATININE, U: 104.1 mg/dL
MICROALB/CREAT RATIO: 0.7 mg/g (ref 0.0–30.0)
Microalb, Ur: <0.7 mg/dL (ref 0.0–1.9)

## 2017-09-07 LAB — LIPID PANEL
CHOLESTEROL: 142 mg/dL (ref 0–200)
HDL: 40.7 mg/dL (ref >39.00)
LDL CALC: 75 mg/dL (ref 0–99)
NonHDL: 101.48
Total CHOL/HDL Ratio: 3
Triglycerides: 132 mg/dL (ref 0.0–149.0)
VLDL: 26.4 mg/dL (ref 0.0–40.0)

## 2017-09-07 LAB — HEMOGLOBIN A1C: Hgb A1c MFr Bld: 7.6 % — ABNORMAL HIGH (ref 4.6–6.5)

## 2017-09-07 LAB — PSA: PSA: 0.71 ng/mL (ref 0.10–4.00)

## 2017-09-07 MED ORDER — METFORMIN HCL ER 500 MG PO TB24: 1500.0000 mg | ORAL_TABLET | Freq: Every day | ORAL | 3 refills | Status: DC

## 2017-09-07 NOTE — Telephone Encounter (Signed)
-----   Message from Corwin Levins, MD sent at 09/07/2017 12:47 PM EDT ----- Letter sent, cont same tx except  The test results show that your current treatment is OK, except the A1c is still mild elevated.  We should increase the metformin ER 500 mg to 3 pills in the AM.  I will send a prescription, and you should hear from the office about this.Lendell Caprice to please inform pt, I will do rx

## 2017-09-07 NOTE — Patient Instructions (Signed)

## 2017-09-07 NOTE — Progress Notes (Signed)
Subjective:    Patient ID: [name], male    DOB: Dec 16, 1970, 47 y.o.   MRN: [id number]  HPI  Here for wellness and f/u;  Overall doing ok;  Pt denies Chest pain, worsening SOB, DOE, wheezing, orthopnea, PND, worsening LE edema, palpitations, dizziness or syncope.  Pt denies neurological change such as new headache, facial or extremity weakness.  Pt denies polydipsia, polyuria, or low sugar symptoms. Pt states overall good compliance with treatment and medications, good tolerability, and has been trying to follow appropriate diet.  Pt denies worsening depressive symptoms, suicidal ideation or panic. No fever, night sweats, wt loss, loss of appetite, or other constitutional symptoms.  Pt states good ability with ADL's, has low fall risk, home safety reviewed and adequate, no other significant changes in hearing or vision, and only occasionally active with exercise.  Plans to make appt with optho soon.  Had some plantar fasciits like symptms in the past 6 mo.  Declines prevnar for now.  More busy lately so not working out as much at the gym  No other interval hx or new complaint Wt Readings from Last 3 Encounters:  09/07/17 227 lb (103 kg)  03/05/17 224 lb (101.6 kg)  09/01/16 227 lb (103 kg)   Past Medical History:  Diagnosis Date  . ALLERGIC RHINITIS 12/31/2008  . DIABETES MELLITUS, TYPE II 06/03/2009  . Dyslipidemia 10/31/2014  . FOOT PAIN, RIGHT 08/08/2010  . HYPERSOMNIA 11/30/2008  . Lumbar disc disease 03/01/2011  . OBSTRUCTIVE SLEEP APNEA 02/03/2009  . OTHER INFLAMMATORY DISORDER MALE GENITAL ORGANS 11/30/2008   Past Surgical History:  Procedure Laterality Date  . HERNIA REPAIR  2012   [facility]    reports that he has never smoked. He has never used smokeless tobacco. He reports that he drinks alcohol. He reports that he does not use drugs. family history includes Cancer in his other; Coronary artery disease in his father; Depression in his maternal grandmother and mother; Diabetes in his  father; Heart attack in his father; Hypertension in his father. Allergies  Allergen Reactions  . Penicillins     Childhood allergy   Current Outpatient Medications on File Prior to Visit  Medication Sig Dispense Refill  . ACCU-CHEK FASTCLIX LANCETS MISC USE TO TEST BLOOD GLUCOSE UP TO 4 TIMES PER DAY 102 each 3  . ACCU-CHEK GUIDE test strip USE TO TEST BLOOD GLUCOSE UP TO 4 TIMES PER DAY 100 each 3  . Alcohol Swabs (B-D SINGLE USE SWABS REGULAR) PADS Use as directed    . aspirin 81 MG tablet Take 81 mg by mouth daily.      . blood glucose meter kit and supplies Dispense based on patient and insurance preference. Use up to four times daily as directed. **ICD10-E11.9** 1 each 0  . Blood Glucose Monitoring Suppl (TRUERESULT BLOOD GLUCOSE) W/DEVICE KIT by Does not apply route. Reported on 10/02/2015    . JANUVIA 100 MG tablet TAKE 1 TABLET BY MOUTH DAILY. 90 tablet 1  . Lancet Devices MISC by Does not apply route.    . NON FORMULARY Reported on 10/02/2015     No current facility-administered medications on file prior to visit.    Review of Systems Constitutional: Negative for other unusual diaphoresis, sweats, appetite or weight changes HENT: Negative for other worsening hearing loss, ear pain, facial swelling, mouth sores or neck stiffness.   Eyes: Negative for other worsening pain, redness or other visual disturbance.  Respiratory: Negative for other stridor or swelling  Cardiovascular: Negative for other palpitations or other chest pain  Gastrointestinal: Negative for worsening diarrhea or loose stools, blood in stool, distention or other pain Genitourinary: Negative for hematuria, flank pain or other change in urine volume.  Musculoskeletal: Negative for myalgias or other joint swelling.  Skin: Negative for other color change, or other wound or worsening drainage.  Neurological: Negative for other syncope or numbness. Hematological: Negative for other adenopathy or  swelling Psychiatric/Behavioral: Negative for hallucinations, other worsening agitation, SI, self-injury, or new decreased concentration All other system neg per pt    Objective:   Physical Exam BP 126/84   Pulse 65   Temp 97.8 F (36.6 C) (Oral)   Ht '5\' 11"'  (1.803 m)   Wt 227 lb (103 kg)   SpO2 98%   BMI 31.66 kg/m  VS noted,  Constitutional: Pt is oriented to person, place, and time. Appears well-developed and well-nourished, in no significant distress and comfortable Head: Normocephalic and atraumatic  Eyes: Conjunctivae and EOM are normal. Pupils are equal, round, and reactive to light Right Ear: External ear normal without discharge Left Ear: External ear normal without discharge Nose: Nose without discharge or deformity Mouth/Throat: Oropharynx is without other ulcerations and moist  Neck: Normal range of motion. Neck supple. No JVD present. No tracheal deviation present or significant neck LA or mass Cardiovascular: Normal rate, regular rhythm, normal heart sounds and intact distal pulses.   Pulmonary/Chest: WOB normal and breath sounds without rales or wheezing  Abdominal: Soft. Bowel sounds are normal. NT. No HSM  Musculoskeletal: Normal range of motion. Exhibits no edema Lymphadenopathy: Has no other cervical adenopathy.  Neurological: Pt is alert and oriented to person, place, and time. Pt has normal reflexes. No cranial nerve deficit. Motor grossly intact, Gait intact Skin: Skin is warm and dry. No rash noted or new ulcerations Psychiatric:  Has normal mood and affect. Behavior is normal without agitation No other exam findings    Assessment & Plan:

## 2017-09-07 NOTE — Telephone Encounter (Signed)
Pt has been informed and expressed understanding.  

## 2017-09-08 LAB — HIV ANTIBODY (ROUTINE TESTING W REFLEX): HIV: NONREACTIVE

## 2017-09-10 NOTE — Assessment & Plan Note (Signed)

## 2017-09-10 NOTE — Assessment & Plan Note (Signed)
stable overall by history and exam, recent data reviewed with pt, and pt to continue medical treatment as before,  to f/u any worsening symptoms or concerns, for f/u a1c with labs today 

## 2017-10-22 MED FILL — ACCU-CHEK GUIDE TEST STRIP: 25 days supply | Qty: 100 | Fill #1

## 2017-10-22 MED FILL — ACCU-CHEK FASTCLIX LANCETS: 26 days supply | Qty: 102 | Fill #1

## 2017-11-24 MED FILL — METFORMIN HCL ER 500 MG TAB: 500 | 90 days supply | Qty: 180 | Fill #3

## 2017-12-08 MED FILL — JANUVIA 100 MG TABLET: 100 | 90 days supply | Qty: 90 | Fill #1

## 2017-12-27 ENCOUNTER — Ambulatory Visit (INDEPENDENT_AMBULATORY_CARE_PROVIDER_SITE_OTHER): Payer: No Typology Code available for payment source | Admitting: Internal Medicine

## 2017-12-27 ENCOUNTER — Encounter: Payer: Self-pay | Admitting: Internal Medicine

## 2017-12-27 VITALS — BP 124/86 | HR 78 | Temp 97.9°F | Ht 71.0 in | Wt 226.0 lb

## 2017-12-27 DIAGNOSIS — E119 Type 2 diabetes mellitus without complications: Secondary | ICD-10-CM | POA: Diagnosis not present

## 2017-12-27 DIAGNOSIS — M79673 Pain in unspecified foot: Secondary | ICD-10-CM | POA: Diagnosis not present

## 2017-12-27 MED ORDER — IBUPROFEN 600 MG PO TABS: 600.0000 mg | ORAL_TABLET | Freq: Three times a day (TID) | ORAL | 1 refills | Status: DC | PRN

## 2017-12-27 NOTE — Progress Notes (Signed)
Subjective:    Patient ID: [name], male    DOB: [birth year], 47 y.o.   MRN: [id number]  HPI   Here with subacute onset left heel plantar pain and soreness for 4 wks, mild to start, worse to first stand and first steps in the AM as well as after sitting longer during the day.  Intermittent, dull and sharp.  Much worse in the past 3 days after he played a game with kids at camp 3 days ago, and pushed off the left heel hard one time and since then quite difficult most of the time to walk.  Just does not seem to be getting better.  No trauma or ulcers.   Pt denies polydipsia, polyuria  Past Medical History:  Diagnosis Date  . ALLERGIC RHINITIS 12/31/2008  . DIABETES MELLITUS, TYPE II 06/03/2009  . Dyslipidemia 10/31/2014  . FOOT PAIN, RIGHT 08/08/2010  . HYPERSOMNIA 11/30/2008  . Lumbar disc disease 03/01/2011  . OBSTRUCTIVE SLEEP APNEA 02/03/2009  . OTHER INFLAMMATORY DISORDER MALE GENITAL ORGANS 11/30/2008   Past Surgical History:  Procedure Laterality Date  . HERNIA REPAIR  2012   [facility]    reports that he has never smoked. He has never used smokeless tobacco. He reports that he drinks alcohol. He reports that he does not use drugs. family history includes Cancer in his other; Coronary artery disease in his father; Depression in his maternal grandmother and mother; Diabetes in his father; Heart attack in his father; Hypertension in his father. Allergies  Allergen Reactions  . Penicillins     Childhood allergy   Current Outpatient Medications on File Prior to Visit  Medication Sig Dispense Refill  . ACCU-CHEK FASTCLIX LANCETS MISC USE TO TEST BLOOD GLUCOSE UP TO 4 TIMES PER DAY 102 each 3  . ACCU-CHEK GUIDE test strip USE TO TEST BLOOD GLUCOSE UP TO 4 TIMES PER DAY 100 each 3  . Alcohol Swabs (B-D SINGLE USE SWABS REGULAR) PADS Use as directed    . aspirin 81 MG tablet Take 81 mg by mouth daily.      . blood glucose meter kit and supplies Dispense based on patient and insurance  preference. Use up to four times daily as directed. **ICD10-E11.9** 1 each 0  . Blood Glucose Monitoring Suppl (TRUERESULT BLOOD GLUCOSE) W/DEVICE KIT by Does not apply route. Reported on 10/02/2015    . JANUVIA 100 MG tablet TAKE 1 TABLET BY MOUTH DAILY. 90 tablet 1  . Lancet Devices MISC by Does not apply route.    . metFORMIN (GLUCOPHAGE-XR) 500 MG 24 hr tablet Take 3 tablets (1,500 mg total) by mouth daily with breakfast. 270 tablet 3  . NON FORMULARY Reported on 10/02/2015     No current facility-administered medications on file prior to visit.    Review of Systems  Constitutional: Negative for other unusual diaphoresis or sweats HENT: Negative for ear discharge or swelling Eyes: Negative for other worsening visual disturbances Respiratory: Negative for stridor or other swelling  Gastrointestinal: Negative for worsening distension or other blood Genitourinary: Negative for retention or other urinary change Musculoskeletal: Negative for other MSK pain or swelling Skin: Negative for color change or other new lesions Neurological: Negative for worsening tremors and other numbness  Psychiatric/Behavioral: Negative for worsening agitation or other fatigue All other system neg per pt    Objective:   Physical Exam BP 124/86   Pulse 78   Temp 97.9 F (36.6 C) (Oral)   Ht '5\' 11"'  (1.803 m)  Wt 226 lb (102.5 kg)   SpO2 95%   BMI 31.52 kg/m  VS noted,  Constitutional: Pt appears in NAD HENT: Head: NCAT.  Right Ear: External ear normal.  Left Ear: External ear normal.  Eyes: . Pupils are equal, round, and reactive to light. Conjunctivae and EOM are normal Nose: without d/c or deformity Neck: Neck supple. Gross normal ROM Cardiovascular: Normal rate and regular rhythm.   Pulmonary/Chest: Effort normal and breath sounds without rales or wheezing.  Left heel with plantar tender without swelling or ulcer Neurological: Pt is alert. At baseline orientation, motor grossly intact Skin:  Skin is warm. No rashes, other new lesions, no LE edema Psychiatric: Pt behavior is normal without agitation  No other exam findings     Assessment & Plan:

## 2017-12-27 NOTE — Assessment & Plan Note (Signed)
C/w plantar fasciitis, first episode, doubt spurring present, for nsaid prn, stretch excercises, heel cushions vs shoe inserts, and refer ortho.

## 2017-12-27 NOTE — Patient Instructions (Signed)
Please take all new medication as prescribed - the ibuprofen for pain as needed  You will be contacted regarding the referral for: orthopedic  Please also consider the stretching excercises, and shoe inserts   Please continue all other medications as before, and refills have been done if requested.  Please have the pharmacy call with any other refills you may need.  Please keep your appointments with your specialists as you may have planned

## 2017-12-27 NOTE — Assessment & Plan Note (Signed)
Lab Results  Component Value Date   HGBA1C 7.6 (H) 09/07/2017   stable overall by history and exam, recent data reviewed with pt, and pt to continue medical treatment as before,  to f/u any worsening symptoms or concerns

## 2018-01-05 ENCOUNTER — Ambulatory Visit (INDEPENDENT_AMBULATORY_CARE_PROVIDER_SITE_OTHER): Payer: No Typology Code available for payment source | Admitting: Orthopaedic Surgery

## 2018-01-05 ENCOUNTER — Telehealth (INDEPENDENT_AMBULATORY_CARE_PROVIDER_SITE_OTHER): Payer: Self-pay | Admitting: Orthopaedic Surgery

## 2018-01-05 ENCOUNTER — Other Ambulatory Visit (INDEPENDENT_AMBULATORY_CARE_PROVIDER_SITE_OTHER): Payer: Self-pay

## 2018-01-05 ENCOUNTER — Encounter (INDEPENDENT_AMBULATORY_CARE_PROVIDER_SITE_OTHER): Payer: Self-pay | Admitting: Orthopaedic Surgery

## 2018-01-05 DIAGNOSIS — M722 Plantar fascial fibromatosis: Secondary | ICD-10-CM | POA: Diagnosis not present

## 2018-01-05 MED ORDER — DICLOFENAC SODIUM 75 MG PO TBEC: 75.0000 mg | DELAYED_RELEASE_TABLET | Freq: Two times a day (BID) | ORAL | 1 refills | Status: DC

## 2018-01-05 MED FILL — DICLOFENAC SODIUM 75 MG TAB: 75 | 30 days supply | Qty: 60 | Fill #0

## 2018-01-05 NOTE — Progress Notes (Signed)
Office Visit Note   Patient: [name]           Date of Birth: 08/09/1970           MRN: [id number] Visit Date: 01/05/2018              Requested by: Corwin LevinsJohn, James W, MD [address] [place], [place] [id number] PCP: Corwin LevinsJohn, James W, MD   Assessment & Plan: Visit Diagnoses:  1. Plantar fasciitis, left     Plan: We will send him to formal physical therapy for modalities, gastrocsoleus stretching, and home exercise program.  Discussed with him at length shoe wear and also discussed some gastrocsoleus stretching exercises he can perform on his own at home.  Placed him on diclofenac 75 mg twice daily for 2 weeks and then once daily.  He will follow-up if his pain persist or becomes worse.  Questions encouraged and answered at length by Dr. Magnus IvanBlackman myself.  Consider plantar fascial injection if pain persist despite these conservative measures.  Follow-Up Instructions: Return if symptoms worsen or fail to improve.   Orders:  No orders of the defined types were placed in this encounter.  Meds ordered this encounter  Medications  . diclofenac (VOLTAREN) 75 MG EC tablet    Sig: Take 1 tablet (75 mg total) by mouth 2 (two) times daily.    Dispense:  60 tablet    Refill:  1      Procedures: No procedures performed   Clinical Data: No additional findings.   Subjective: Chief Complaint  Patient presents with  . Left Foot - Pain    HPI Mr. [name] is a 47 year old male comes in today with left heel pain that has been going on for the past 3 to 4 weeks.  No known injury.  He denies any significant swelling.  He does not wear any open back shoes.  He is tried Aleve with no real relief.  He is tried hot compresses also.  He is tried self massage of the heel with some stretching.  He is now having trouble walking at work as he walks about 5 miles daily is now using a golf cart get around.  He is diabetic reports his lasting globin A1c was 7.4. Review of Systems See HPI otherwise  negative  Objective: Vital Signs: There were no vitals taken for this visit.  Physical Exam General: Well-developed well-nourished male in no acute distress mood affect appropriate Psych: Alert and oriented x3. Ortho Exam Bilateral tight gastrocs left greater than right.  Left dorsal pedal pulse 2+.  No rashes skin lesions ulcerations erythema about the left foot.  He has good range of motion of the ankle without pain.  Nontender the posterior tibial tendons peroneal tendons.  Inversion eversion left foot against resistance reveals 5 out of 5 strength.  He is nontender.  Left calf supple nontender.  Tenderness of the medial tubercle of the left calcaneus.  No tenderness over metatarsal heads of the left foot. Specialty Comments:  No specialty comments available.  Imaging: No results found.   PMFS History: Patient Active Problem List   Diagnosis Date Noted  . Heel pain 12/27/2017  . Ingrown nail 05/30/2015  . Skin lesion 05/30/2015  . Dyslipidemia 10/31/2014  . Obesity 10/31/2014  . Low back pain 03/09/2013  . Lumbar disc disease 03/01/2011  . Preventative health care 02/22/2011  . Diabetes (HCC) 06/03/2009  . Sleep apnea 02/03/2009  . ALLERGIC RHINITIS 12/31/2008  Past Medical History:  Diagnosis Date  . ALLERGIC RHINITIS 12/31/2008  . DIABETES MELLITUS, TYPE II 06/03/2009  . Dyslipidemia 10/31/2014  . FOOT PAIN, RIGHT 08/08/2010  . HYPERSOMNIA 11/30/2008  . Lumbar disc disease 03/01/2011  . OBSTRUCTIVE SLEEP APNEA 02/03/2009  . OTHER INFLAMMATORY DISORDER MALE GENITAL ORGANS 11/30/2008    Family History  Problem Relation Age of Onset  . Depression Mother   . Diabetes Father   . Hypertension Father   . Heart attack Father   . Coronary artery disease Father   . Depression Maternal Grandmother   . Cancer Other        facial skin cancers    Past Surgical History:  Procedure Laterality Date  . HERNIA REPAIR  2012   [facility]   Social History   Occupational History  .  Occupation: Airline pilotsales  [employer]    Employer: [employer]  Tobacco Use  . Smoking status: Never Smoker  . Smokeless tobacco: Never Used  Substance and Sexual Activity  . Alcohol use: Yes    Comment: social  . Drug use: No  . Sexual activity: Not on file

## 2018-01-05 NOTE — Telephone Encounter (Signed)
[facility], [facility]. He is asking to be referred to a [facility] facility that [place] to [place]. Patients # [phone]

## 2018-01-05 NOTE — Telephone Encounter (Signed)
[facility]

## 2018-01-17 ENCOUNTER — Ambulatory Visit: Payer: No Typology Code available for payment source | Attending: Physician Assistant | Admitting: Physical Therapy

## 2018-01-17 ENCOUNTER — Encounter: Payer: Self-pay | Admitting: Physical Therapy

## 2018-01-17 DIAGNOSIS — M722 Plantar fascial fibromatosis: Secondary | ICD-10-CM | POA: Diagnosis not present

## 2018-01-17 DIAGNOSIS — R269 Unspecified abnormalities of gait and mobility: Secondary | ICD-10-CM | POA: Diagnosis present

## 2018-01-17 DIAGNOSIS — R2689 Other abnormalities of gait and mobility: Secondary | ICD-10-CM | POA: Insufficient documentation

## 2018-01-17 NOTE — Therapy (Deleted)
[facility] [address] [place], [place], [address] Phone: [phone]   Fax:  [phone]  Physical Therapy Evaluation  Patient Details  Name: [name] MRN: [id number] Date of Birth: 07/07/1970 Referring Provider: Clydie BraunAshley McClintock   Encounter Date: 01/17/2018  PT End of Session - 01/19/18 1255    Visit Number  1    Number of Visits  8    Date for PT Re-Evaluation  02/16/18    PT Start Time  0728    PT Stop Time  0834    PT Time Calculation (min)  66 min    Activity Tolerance  Patient tolerated treatment well;Patient limited by pain    Behavior During Therapy  [facility] for tasks assessed/performed       Past Medical History:  Diagnosis Date  . ALLERGIC RHINITIS 12/31/2008  . DIABETES MELLITUS, TYPE II 06/03/2009  . Dyslipidemia 10/31/2014  . FOOT PAIN, RIGHT 08/08/2010  . HYPERSOMNIA 11/30/2008  . Lumbar disc disease 03/01/2011  . OBSTRUCTIVE SLEEP APNEA 02/03/2009  . OTHER INFLAMMATORY DISORDER MALE GENITAL ORGANS 11/30/2008    Past Surgical History:  Procedure Laterality Date  . HERNIA REPAIR  2012   [facility]    There were no vitals filed for this visit.       [facility] - 01/19/18 0001      Assessment   Referring Provider  Clydie BraunAshley McClintock     EVALUATION  Pain Present: 2/10 Best: 1/10 Worst: 7/10   Gait / Mobility Antalgic gait on L LE  ROM / MMT  Hip R L Flex 5 5  Abd 5 5 Add 5 5  Knee R L Ext 5 5 Flex 5 5   PROM Ankle R L Dorsi 14 6 Plant 50 41 Inver 42 40 Ever 13 19  AROM Ankle R L Dorsi 9 10 Plant 47 36  SLR  R L  56 deg 51 deg     Objective measurements completed on examination: See above findings.     Patient will benefit from skilled therapeutic intervention in order to improve the following deficits and impairments:  Abnormal gait, Pain, Impaired flexibility, Hypomobility  Visit Diagnosis: Bilateral plantar fasciitis  Other abnormalities of gait and  mobility  Gait difficulty     Problem List Patient Active Problem List   Diagnosis Date Noted  . Heel pain 12/27/2017  . Ingrown nail 05/30/2015  . Skin lesion 05/30/2015  . Dyslipidemia 10/31/2014  . Obesity 10/31/2014  . Low back pain 03/09/2013  . Lumbar disc disease 03/01/2011  . Preventative health care 02/22/2011  . Diabetes (HCC) 06/03/2009  . Sleep apnea 02/03/2009  . ALLERGIC RHINITIS 12/31/2008    Nolon BussingJoshua Margarete Horace, SPT 01/20/2018, 7:26 AM  [facility] [address] [place], [place], [address] Phone: [phone]   Fax:  [phone]  Name: [name] MRN: [id number] Date of Birth: 02/06/1971

## 2018-01-20 ENCOUNTER — Ambulatory Visit: Payer: No Typology Code available for payment source | Admitting: Physical Therapy

## 2018-01-20 ENCOUNTER — Encounter: Payer: Self-pay | Admitting: Physical Therapy

## 2018-01-20 DIAGNOSIS — M722 Plantar fascial fibromatosis: Secondary | ICD-10-CM

## 2018-01-20 DIAGNOSIS — R2689 Other abnormalities of gait and mobility: Secondary | ICD-10-CM

## 2018-01-20 DIAGNOSIS — R269 Unspecified abnormalities of gait and mobility: Secondary | ICD-10-CM

## 2018-01-20 NOTE — Therapy (Signed)
[facility] [address] [place], [place], [address] Phone: [phone]   Fax:  [phone]  Physical Therapy Treatment  Patient Details  Name: [name] MRN: [id number] Date of Birth: Jan 08, 1971 Referring Provider: Clydie Braun   Encounter Date: 01/20/2018  PT End of Session - 01/20/18 0735    Visit Number  2    Number of Visits  8    Date for PT Re-Evaluation  02/16/18    PT Start Time  0729    PT Stop Time  0821    PT Time Calculation (min)  52 min    Activity Tolerance  Patient tolerated treatment well;Patient limited by pain    Behavior During Therapy  [facility] for tasks assessed/performed       Past Medical History:  Diagnosis Date  . ALLERGIC RHINITIS 12/31/2008  . DIABETES MELLITUS, TYPE II 06/03/2009  . Dyslipidemia 10/31/2014  . FOOT PAIN, RIGHT 08/08/2010  . HYPERSOMNIA 11/30/2008  . Lumbar disc disease 03/01/2011  . OBSTRUCTIVE SLEEP APNEA 02/03/2009  . OTHER INFLAMMATORY DISORDER MALE GENITAL ORGANS 11/30/2008    Past Surgical History:  Procedure Laterality Date  . HERNIA REPAIR  2012   [facility]    There were no vitals filed for this visit.  Subjective Assessment - 01/20/18 0732    Subjective  Pt. reports he is not in as much pain as he has been, and even waking up not near as much pain.  Pt. reports stiffness more so than pain.  Pt. reports he has been stretching more frequently and it seems to be helping, even with hamstrings and gastrocs.    Limitations  Standing;Walking    Currently in Pain?  Yes    Pain Score  2     Pain Location  Foot    Pain Orientation  Left    Pain Descriptors / Indicators  Sore    Pain Type  Chronic pain    Pain Onset  1 to 4 weeks ago    Pain Frequency  Intermittent         Treatment:   Manual:  SupineSTM to B plantar fascia (32 min)  Pt. Toes extended for deeper STM Seated Ice Massage to B plantar fascia with use of Ice Roller on Floor (10 min)  Reviewed HEP/  stretches    PT Long Term Goals - 01/19/18 1308      PT LONG TERM GOAL #1   Title  Pt will decrease worst pain as reported on NPRS by at least 3 points in order to demonstrate clinically significant reduction in pain.    Baseline  8/28 Worst: 7/10    Time  4    Period  Weeks    Status  New    Target Date  02/14/18      PT LONG TERM GOAL #2   Title  Pt. will complete FOTO and improve to 70 to improve daily functional mobility.    Baseline  8/28 FOTO: 67    Time  4    Period  Weeks    Status  New    Target Date  02/14/18      PT LONG TERM GOAL #3   Title  Pt. will ambulate >500 ft on gravel without increase in pain to simulate walking at work.    Baseline  Currently unable to walk >500 ft. without increase in pain.    Time  4    Period  Weeks  Status  New    Target Date  02/14/18            Plan - 01/20/18 0816    Clinical Impression Statement  Pt. responded well to manual therapy and ice massage to plantar fascia.  Pt. reports decreased pain with manual therapy after sesion was concluded.  Pt. continues to notice tenderness in L plantar fascia near insertion site, and tenderness more so in the arch of the R plantar fascia.  Pt. is making progress specifically with tolerance at work and during the later days of the week.    Clinical Presentation  Stable    Clinical Decision Making  Low    Rehab Potential  Good    PT Frequency  2x / week    PT Duration  4 weeks    PT Treatment/Interventions  Cryotherapy;Electrical Stimulation;Iontophoresis 4mg /ml Dexamethasone;Moist Heat;Ultrasound;Parrafin;Gait training;Therapeutic exercise;Therapeutic activities;Manual techniques;Passive range of motion;Splinting;Taping;Dry needling    PT Next Visit Plan  give HEP.    PT Home Exercise Plan  see handout.       Patient will benefit from skilled therapeutic intervention in order to improve the following deficits and impairments:  Abnormal gait, Pain, Impaired flexibility,  Hypomobility  Visit Diagnosis: Bilateral plantar fasciitis  Other abnormalities of gait and mobility  Gait difficulty     Problem List Patient Active Problem List   Diagnosis Date Noted  . Heel pain 12/27/2017  . Ingrown nail 05/30/2015  . Skin lesion 05/30/2015  . Dyslipidemia 10/31/2014  . Obesity 10/31/2014  . Low back pain 03/09/2013  . Lumbar disc disease 03/01/2011  . Preventative health care 02/22/2011  . Diabetes (HCC) 06/03/2009  . Sleep apnea 02/03/2009  . ALLERGIC RHINITIS 12/31/2008   Cammie McgeeMichael C [name], PT, DPT # [id number] Nolon BussingJoshua Donetta [name], SPT 01/20/2018, 2:53 PM  [facility] [address] [place], [place], [address] Phone: [phone]   Fax:  [phone]  Name: [name] MRN: [id number] Date of Birth: 11/23/1970

## 2018-01-20 NOTE — Therapy (Signed)
[facility] [address] [place], [place], [address] Phone: [phone]   Fax:  [phone]  Physical Therapy Evaluation  Patient Details  Name: [name] MRN: [id number] Date of Birth: 04/12/1971 Referring Provider: Clydie BraunAshley McClintock   Encounter Date: 01/17/2018  PT End of Session - 01/19/18 1255    Visit Number  1    Number of Visits  8    Date for PT Re-Evaluation  02/14/18    PT Start Time  0728    PT Stop Time  0834    PT Time Calculation (min)  66 min    Activity Tolerance  Patient tolerated treatment well;Patient limited by pain    Behavior During Therapy  [facility] for tasks assessed/performed       Past Medical History:  Diagnosis Date  . ALLERGIC RHINITIS 12/31/2008  . DIABETES MELLITUS, TYPE II 06/03/2009  . Dyslipidemia 10/31/2014  . FOOT PAIN, RIGHT 08/08/2010  . HYPERSOMNIA 11/30/2008  . Lumbar disc disease 03/01/2011  . OBSTRUCTIVE SLEEP APNEA 02/03/2009  . OTHER INFLAMMATORY DISORDER MALE GENITAL ORGANS 11/30/2008    Past Surgical History:  Procedure Laterality Date  . HERNIA REPAIR  2012   [facility]    There were no vitals filed for this visit.    Pt. is 47 y.o. with c/o B plantar fasciitis, specifically worse in the L LE. Pt. reports that pain has been present for the past month. 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 Pt. reports that he typically works Monday-Friday and rests "doing almost nothing" on the weekends.       EVALUATION  Pain Present: 2/10 Best: 1/10 Worst: 7/10   Gait / Mobility Antalgic gait on L LE  ROM / MMT  Hip R L Flex 5 5  Abd 5 5 Add 5 5  Knee R L Ext 5 5 Flex 5 5   PROM Ankle R L Dorsi 14 6 Plant 50 41 Inver 42 40 Ever 13 19  AROM Ankle R L Dorsi 9 10 Plant 47 36  SLR  R L  56 deg 51 deg       See HEP     PT Long Term Goals - 01/19/18 1308      PT LONG TERM GOAL #1   Title  Pt will decrease  worst pain as reported on NPRS by at least 3 points in order to demonstrate clinically significant reduction in pain.    Baseline  8/28 Worst: 7/10    Time  4    Period  Weeks    Status  New    Target Date  02/14/18      PT LONG TERM GOAL #2   Title  Pt. will complete FOTO and improve to 70 to improve daily functional mobility.    Baseline  8/28 FOTO: 67    Time  4    Period  Weeks    Status  New    Target Date  02/14/18      PT LONG TERM GOAL #3   Title  Pt. will ambulate >500 ft on gravel without increase in pain to simulate walking at work.    Baseline  Currently unable to walk >500 ft. without increase in pain.    Time  4    Period  Weeks    Status  New    Target Date  02/14/18  Plan - 01/19/18 1257    Clinical Impression Statement  Pt. is pleasant 47 y.o. male seeking therapy after referral for B plantar fasciitis.  Pt. has had difficulty working on his feet towards end of the week due to pain from B LE.  Pt. demonstrates good arches B, along with even wear on shoes.  Pt. notes tenderness on L plantar fasciities along medial arch, and increased pain near insertion site of R plantar fasciitis.  Pt. reports increased pain in L plantar surface area.  Pt. currently demonstrates 5/5 general strength in B LE, and WNL of ROM, however L ankle is decreased in dorsiflexion when compared to R.  Pt. demonstrates slight antalgic gait from walking as well.  Pt. will benefit from skilled therapy in order to alleviate pain in B LE and increase ROM of L ankle to increase pain-free mobility necessary for work nd community activities.    Clinical Presentation  Stable    Clinical Decision Making  Low    Rehab Potential  Good    PT Frequency  2x / week    PT Duration  4 weeks    PT Treatment/Interventions  Cryotherapy;Electrical Stimulation;Iontophoresis 4mg /ml Dexamethasone;Moist Heat;Ultrasound;Parrafin;Gait training;Therapeutic exercise;Therapeutic activities;Manual techniques;Passive  range of motion;Splinting;Taping;Dry needling    PT Next Visit Plan  give HEP.    PT Home Exercise Plan  see handout.       Patient will benefit from skilled therapeutic intervention in order to improve the following deficits and impairments:  Abnormal gait, Pain, Impaired flexibility, Hypomobility  Visit Diagnosis: Bilateral plantar fasciitis  Other abnormalities of gait and mobility  Gait difficulty     Problem List Patient Active Problem List   Diagnosis Date Noted  . Heel pain 12/27/2017  . Ingrown nail 05/30/2015  . Skin lesion 05/30/2015  . Dyslipidemia 10/31/2014  . Obesity 10/31/2014  . Low back pain 03/09/2013  . Lumbar disc disease 03/01/2011  . Preventative health care 02/22/2011  . Diabetes (HCC) 06/03/2009  . Sleep apnea 02/03/2009  . ALLERGIC RHINITIS 12/31/2008   Cammie Mcgee, PT, DPT # [id number] Tomasa Hose, SPT 01/20/2018, 8:57 AM  [facility] [address] [place], [place], [address] Phone: [phone]   Fax:  [phone]  Name: [name] MRN: [id number] Date of Birth: [birth year]

## 2018-01-25 ENCOUNTER — Telehealth: Payer: Self-pay | Admitting: Internal Medicine

## 2018-01-25 DIAGNOSIS — M722 Plantar fascial fibromatosis: Secondary | ICD-10-CM

## 2018-01-25 DIAGNOSIS — R269 Unspecified abnormalities of gait and mobility: Secondary | ICD-10-CM

## 2018-01-25 NOTE — Telephone Encounter (Signed)
Ok referral Is done

## 2018-01-25 NOTE — Addendum Note (Signed)
Addended by: Corwin Levins on: 01/25/2018 03:20 PM   Modules accepted: Orders

## 2018-01-25 NOTE — Telephone Encounter (Signed)
Copied from CRM [id number]. Topic: Referral - Request >> Jan 25, 2018  2:25 PM Darletta Moll L wrote: Reason for CRM: Patient needs a referral to [facility] in [place] from Dr. Jonny Ruiz because his insurance wont accept the referral from [facility] which is the office that attempted to refer him there. He needs physical therapy according to them. Please call patient to confirm referral will be placed.

## 2018-01-26 ENCOUNTER — Ambulatory Visit: Payer: No Typology Code available for payment source | Attending: Physician Assistant | Admitting: Physical Therapy

## 2018-01-26 ENCOUNTER — Encounter: Payer: Self-pay | Admitting: Physical Therapy

## 2018-01-26 DIAGNOSIS — R269 Unspecified abnormalities of gait and mobility: Secondary | ICD-10-CM | POA: Insufficient documentation

## 2018-01-26 DIAGNOSIS — R2689 Other abnormalities of gait and mobility: Secondary | ICD-10-CM | POA: Diagnosis present

## 2018-01-26 DIAGNOSIS — M722 Plantar fascial fibromatosis: Secondary | ICD-10-CM | POA: Diagnosis present

## 2018-01-26 NOTE — Therapy (Signed)
[facility] [address] [place], [place], [address] Phone: [phone]   Fax:  [phone]  Physical Therapy Treatment  Patient Details  Name: [name] MRN: [id number] Date of Birth: [birth year] Referring Provider: Clydie Braun   Encounter Date: 01/26/2018  PT End of Session - 01/26/18 1253    Visit Number  3    Number of Visits  8    Date for PT Re-Evaluation  02/16/18    PT Start Time  0730    PT Stop Time  0817    PT Time Calculation (min)  47 min    Activity Tolerance  Patient tolerated treatment well;Patient limited by pain    Behavior During Therapy  [facility] for tasks assessed/performed       Past Medical History:  Diagnosis Date  . ALLERGIC RHINITIS 12/31/2008  . DIABETES MELLITUS, TYPE II 06/03/2009  . Dyslipidemia 10/31/2014  . FOOT PAIN, RIGHT 08/08/2010  . HYPERSOMNIA 11/30/2008  . Lumbar disc disease 03/01/2011  . OBSTRUCTIVE SLEEP APNEA 02/03/2009  . OTHER INFLAMMATORY DISORDER MALE GENITAL ORGANS 11/30/2008    Past Surgical History:  Procedure Laterality Date  . HERNIA REPAIR  2012   [facility]    There were no vitals filed for this visit.  Subjective Assessment - 01/26/18 1244    Subjective  Pt. reports that pain has resolved after being compliant with HEP over the past week.  Pt. reports that he has felt significantly better and has been more mobile over the weekend as well, all with a decrease in overall pain.  Pt. reports stretching gastrocs and hamstrings have been beneficial as well and that he will continue to perform those.    Limitations  Standing;Walking    Currently in Pain?  No/denies    Pain Score  0-No pain    Pain Onset  1 to 4 weeks ago          Treatment:   Manual:  SupineSTM to B plantar fascia (24 min)             Pt. Toes extended for deeper STM Supine B Distal Hamstring Stretch 2x30 sec each Supine B Proximal Hamstring Stretch 2x30 sec each Supine B Piriformis Stretch 2x30 sec  each Supine B Figure-4 Stretch 2x30 sec each   Reviewed HEP/ stretches      PT Education - 01/26/18 1253    Education Details  Pt. instructed to continue with stretching exercises involving plantar fascia, hamstrings, and calves.    Person(s) Educated  Patient    Methods  Explanation;Demonstration;Tactile cues;Verbal cues    Comprehension  Verbalized understanding;Returned demonstration          PT Long Term Goals - 01/19/18 1308      PT LONG TERM GOAL #1   Title  Pt will decrease worst pain as reported on NPRS by at least 3 points in order to demonstrate clinically significant reduction in pain.    Baseline  8/28 Worst: 7/10    Time  4    Period  Weeks    Status  New    Target Date  02/14/18      PT LONG TERM GOAL #2   Title  Pt. will complete FOTO and improve to 70 to improve daily functional mobility.    Baseline  8/28 FOTO: 67    Time  4    Period  Weeks    Status  New    Target Date  02/14/18  PT LONG TERM GOAL #3   Title  Pt. will ambulate >500 ft on gravel without increase in pain to simulate walking at work.    Baseline  Currently unable to walk >500 ft. without increase in pain.    Time  4    Period  Weeks    Status  New    Target Date  02/14/18            Plan - 01/26/18 1254    Clinical Impression Statement  Pt. tolerated manual therapy well and noted that R plantar fascia is more restricted than L now.  Focus was placed on R LE in stretches along with STM to plantar fascia.  Pt. encouraged to continue with HEP and stretching program to maintain movement gained and decreased pain.  Pt. educated on possible D/C depending on how he feels over the next week.    Clinical Presentation  Stable    Clinical Decision Making  Low    Rehab Potential  Good    PT Frequency  2x / week    PT Duration  4 weeks    PT Treatment/Interventions  Cryotherapy;Electrical Stimulation;Iontophoresis 4mg /ml Dexamethasone;Moist Heat;Ultrasound;Parrafin;Gait  training;Therapeutic exercise;Therapeutic activities;Manual techniques;Passive range of motion;Splinting;Taping;Dry needling    PT Next Visit Plan  possible D/C.    PT Home Exercise Plan  see handout.       Patient will benefit from skilled therapeutic intervention in order to improve the following deficits and impairments:  Abnormal gait, Pain, Impaired flexibility, Hypomobility  Visit Diagnosis: Bilateral plantar fasciitis  Other abnormalities of gait and mobility  Gait difficulty     Problem List Patient Active Problem List   Diagnosis Date Noted  . Heel pain 12/27/2017  . Ingrown nail 05/30/2015  . Skin lesion 05/30/2015  . Dyslipidemia 10/31/2014  . Obesity 10/31/2014  . Low back pain 03/09/2013  . Lumbar disc disease 03/01/2011  . Preventative health care 02/22/2011  . Diabetes (HCC) 06/03/2009  . Sleep apnea 02/03/2009  . ALLERGIC RHINITIS 12/31/2008   Cammie Mcgee, PT, DPT # [id number] Nolon Bussing, SPT 01/26/2018, 12:56 PM  [facility] [address] [place], [place], [address] Phone: [phone]   Fax:  [phone]  Name: [name] MRN: [id number] Date of Birth: August 20, 1970

## 2018-01-31 ENCOUNTER — Ambulatory Visit: Payer: No Typology Code available for payment source | Admitting: Physical Therapy

## 2018-01-31 ENCOUNTER — Encounter: Payer: Self-pay | Admitting: Physical Therapy

## 2018-01-31 DIAGNOSIS — M722 Plantar fascial fibromatosis: Secondary | ICD-10-CM | POA: Diagnosis not present

## 2018-01-31 DIAGNOSIS — R2689 Other abnormalities of gait and mobility: Secondary | ICD-10-CM

## 2018-01-31 DIAGNOSIS — R269 Unspecified abnormalities of gait and mobility: Secondary | ICD-10-CM

## 2018-01-31 NOTE — Patient Instructions (Signed)
Access Code: [email or web address]  URL: [email or web address]  Date: 01/31/2018  Prepared by: Dorene Grebe   Exercises  Arch Stretch - 4 reps - 1 sets - 10 hold - 1x daily - 7x weekly  Seated Arch Lifts - 4 reps - 1 sets - 1x daily - 7x weekly

## 2018-01-31 NOTE — Therapy (Signed)
[facility] [address] [place], [place], [address] Phone: [phone]   Fax:  [phone]  Physical Therapy Treatment  Patient Details  Name: [name] MRN: [id number] Date of Birth: 06-04-1970 Referring Provider: Anise Salvo   Encounter Date: 01/31/2018  PT End of Session - 01/31/18 0817    Visit Number  4    Number of Visits  8    Date for PT Re-Evaluation  02/16/18    PT Start Time  0733    PT Stop Time  0822    PT Time Calculation (min)  49 min    Activity Tolerance  Patient tolerated treatment well;Patient limited by pain    Behavior During Therapy  [facility] for tasks assessed/performed       Past Medical History:  Diagnosis Date  . ALLERGIC RHINITIS 12/31/2008  . DIABETES MELLITUS, TYPE II 06/03/2009  . Dyslipidemia 10/31/2014  . FOOT PAIN, RIGHT 08/08/2010  . HYPERSOMNIA 11/30/2008  . Lumbar disc disease 03/01/2011  . OBSTRUCTIVE SLEEP APNEA 02/03/2009  . OTHER INFLAMMATORY DISORDER MALE GENITAL ORGANS 11/30/2008    Past Surgical History:  Procedure Laterality Date  . HERNIA REPAIR  2012   [facility]    There were no vitals filed for this visit.  Subjective Assessment - 01/31/18 0812    Subjective  Pt. reports pain was not present over the weekend and he was much more active, logging >5,000 steps each day.  Pt. notices a difference since initial eval in pain relief.    Limitations  Standing;Walking    Currently in Pain?  No/denies    Pain Score  0-No pain    Pain Onset  1 to 4 weeks ago       Treatment:   Manual:  SupineSTM toBplantar fascia (109mn) Pt. Toes extended for deeper STM   There Ex:  Ice Bottle Roll on B Plantar Fascia (6 min) B Arch Lifts with 10 sec holds (4 min) B Arch Stretch against step (4 min)  Reviewed HEP/ stretches     PT Education - 01/31/18 0847    Education Details  Pt. given new HEP to work on over next week.    Person(s) Educated  Patient    Methods   Explanation;Demonstration;Tactile cues;Verbal cues;Handout    Comprehension  Verbalized understanding;Returned demonstration          PT Long Term Goals - 01/19/18 1308      PT LONG TERM GOAL #1   Title  Pt will decrease worst pain as reported on NPRS by at least 3 points in order to demonstrate clinically significant reduction in pain.    Baseline  8/28 Worst: 7/10    Time  4    Period  Weeks    Status  New    Target Date  02/14/18      PT LONG TERM GOAL #2   Title  Pt. will complete FOTO and improve to 70 to improve daily functional mobility.    Baseline  8/28 FOTO: 67    Time  4    Period  Weeks    Status  New    Target Date  02/14/18      PT LONG TERM GOAL #3   Title  Pt. will ambulate >500 ft on gravel without increase in pain to simulate walking at work.    Baseline  Currently unable to walk >500 ft. without increase in pain.    Time  4    Period  Weeks    Status  New    Target Date  02/14/18            Plan - 01/31/18 0819    Clinical Impression Statement  Pt. received manual threapy after noting slight stiffness in R plantar fascia.  Pt. tolerated manual therapy well with a reported decrease in pain.  Pt. introduced to new HEP that included arch lift along with arch stretch again steps.  Pt. performed exercises in clinic with good technique and no increase in pain.  Pt. reported good stretch to plantar fascia when placed against step and knee bent.  D/C options were discussed for next visit due to progress made in therapy.    Clinical Presentation  Stable    Clinical Decision Making  Low    Rehab Potential  Good    PT Frequency  2x / week    PT Duration  4 weeks    PT Treatment/Interventions  Cryotherapy;Electrical Stimulation;Iontophoresis 21m/ml Dexamethasone;Moist Heat;Ultrasound;Parrafin;Gait training;Therapeutic exercise;Therapeutic activities;Manual techniques;Passive range of motion;Splinting;Taping;Dry needling    PT Next Visit Plan  d/c at next visit  if goals met    PT Home Exercise Plan  see updated handout.       Patient will benefit from skilled therapeutic intervention in order to improve the following deficits and impairments:  Abnormal gait, Pain, Impaired flexibility, Hypomobility  Visit Diagnosis: Bilateral plantar fasciitis  Other abnormalities of gait and mobility  Gait difficulty     Problem List Patient Active Problem List   Diagnosis Date Noted  . Heel pain 12/27/2017  . Ingrown nail 05/30/2015  . Skin lesion 05/30/2015  . Dyslipidemia 10/31/2014  . Obesity 10/31/2014  . Low back pain 03/09/2013  . Lumbar disc disease 03/01/2011  . Preventative health care 02/22/2011  . Diabetes ([place] 06/03/2009  . Sleep apnea 02/03/2009  . ALLERGIC RHINITIS 12/31/2008   MPura Spice PT, DPT # [id number] Saran SPT 01/31/2018, 3:28 PM  [facility] [place] [address]Phone: [phone]  Fax:  [phone] Name: Joshua RIDDLESMRN: [id number] of Birth: [birth year]

## 2018-02-02 NOTE — Telephone Encounter (Signed)
Office policy is that we do not normally do "retro" referrals  Please direct pt to Raynelle Fanning if needed

## 2018-02-02 NOTE — Telephone Encounter (Signed)
[name], NT 02/02/2018 03:00 PM  Summary: referral needed asap    Pt states he has been doing physical therapy at [facility] in [facility] and he needs the referral sent in order for the focus plan to cover his visits. He needs this as soon as possible. He also would like to see if this can be back dated from when he started PT which was on 01/17/18.

## 2018-02-03 ENCOUNTER — Encounter: Payer: No Typology Code available for payment source | Admitting: Physical Therapy

## 2018-02-04 NOTE — Telephone Encounter (Signed)
As patient has the Focus plan, it is the patient's responsibility to notify [facility] 24 hours before receiving care from a specialist. Even if we could "back date" a referral , it would not help him in this case as the patient has to make the [facility] to [facility] not the PCP. Referral is noted in our system, but back dating is not possible.  Called pt, no answer and voicemail full. No MyChart account open.

## 2018-02-07 ENCOUNTER — Ambulatory Visit: Payer: No Typology Code available for payment source | Admitting: Physical Therapy

## 2018-02-07 DIAGNOSIS — M722 Plantar fascial fibromatosis: Secondary | ICD-10-CM

## 2018-02-07 DIAGNOSIS — R2689 Other abnormalities of gait and mobility: Secondary | ICD-10-CM

## 2018-02-07 DIAGNOSIS — R269 Unspecified abnormalities of gait and mobility: Secondary | ICD-10-CM

## 2018-02-07 NOTE — Therapy (Signed)
[facility] [address] [place], [place], [address] Phone: [phone]   Fax:  [phone]  Physical Therapy Treatment  Patient Details  Name: JENTZEN MINASYAN MRN: [id number] Date of Birth: 06-03-1970 Referring Provider: Anise Salvo   Encounter Date: 02/07/2018  PT End of Session - 02/07/18 0742    Visit Number  5    Number of Visits  8    Date for PT Re-Evaluation  02/16/18    PT Start Time  0734    PT Stop Time  0822    PT Time Calculation (min)  48 min    Activity Tolerance  Patient tolerated treatment well    Behavior During Therapy  [facility] for tasks assessed/performed       Past Medical History:  Diagnosis Date  . ALLERGIC RHINITIS 12/31/2008  . DIABETES MELLITUS, TYPE II 06/03/2009  . Dyslipidemia 10/31/2014  . FOOT PAIN, RIGHT 08/08/2010  . HYPERSOMNIA 11/30/2008  . Lumbar disc disease 03/01/2011  . OBSTRUCTIVE SLEEP APNEA 02/03/2009  . OTHER INFLAMMATORY DISORDER MALE GENITAL ORGANS 11/30/2008    Past Surgical History:  Procedure Laterality Date  . HERNIA REPAIR  2012   [facility]    There were no vitals filed for this visit.  Subjective Assessment - 02/07/18 0738    Subjective  Pt. reports he worked overtime this past weekend and was not in any pain.  Pt. states that he went shopping with wife at [facility] in [place] and walked more than normal outside, however had no increase in pain.    Limitations  Standing;Walking    Currently in Pain?  No/denies    Pain Onset  1 to 4 weeks ago        Treatment:   Manual:  SupineSTM toBplantar fascia (27mn) Pt. Toes extended for deeper STM Supine Generalized Stretching to B Gastroc/Soleus (8 min)  Goal reassessment performed as noted below.    PT Education - 02/07/18 1237    Education Details  Pt. educated on d/c options and continuing with stretching program over the next several months to keep plantar fascia from tightening up again.    Person(s) Educated  Patient    Methods  Explanation    Comprehension  Verbalized understanding          PT Long Term Goals - 02/07/18 0743      PT LONG TERM GOAL #1   Title  Pt will decrease worst pain as reported on NPRS by at least 3 points in order to demonstrate clinically significant reduction in pain.    Baseline  8/28 Worst: 7/10; 9/16 Worst: 0/10 pain    Time  4    Period  Weeks    Status  Achieved    Target Date  02/15/15      PT LONG TERM GOAL #2   Title  Pt. will complete FOTO and improve to 70 to improve daily functional mobility.    Baseline  8/28 FOTO: 67    Time  4    Period  Weeks    Status  Achieved    Target Date  02/14/18      PT LONG TERM GOAL #3   Title  Pt. will ambulate >500 ft on gravel without increase in pain to simulate walking at work.    Baseline  Currently unable to walk >500 ft. without increase in pain.; Is able to walk without any pain for prolonged priods of time/ increased distance.  Time  4    Period  Weeks    Status  Achieved    Target Date  02/14/18            Plan - 02/07/18 0812    Clinical Impression Statement  Pt. reports no increase in pain with increased activities over the past couple weeks.  Pt. tolerated manual therapy to B LE today and in past several treatments with no significant increase in pain.  Pt. is able to perform work-relateed duties and walk with wife during extended shopping trips without any increase in pain.  Pt. has met all goals set forth at initial evaluation.  Pt. encouraged to return to therapy if regression occurs at any point in time.      Clinical Presentation  Stable    Clinical Decision Making  Low    Rehab Potential  Good    PT Frequency  2x / week    PT Duration  4 weeks    PT Treatment/Interventions  Cryotherapy;Electrical Stimulation;Iontophoresis 4mg/ml Dexamethasone;Moist Heat;Ultrasound;Parrafin;Gait training;Therapeutic exercise;Therapeutic activities;Manual techniques;Passive range of  motion;Splinting;Taping;Dry needling    PT Next Visit Plan  d/c at this time.    PT Home Exercise Plan  see updated handout.       Patient will benefit from skilled therapeutic intervention in order to improve the following deficits and impairments:  Abnormal gait, Pain, Impaired flexibility, Hypomobility  Visit Diagnosis: Bilateral plantar fasciitis  Other abnormalities of gait and mobility  Gait difficulty     Problem List Patient Active Problem List   Diagnosis Date Noted  . Heel pain 12/27/2017  . Ingrown nail 05/30/2015  . Skin lesion 05/30/2015  . Dyslipidemia 10/31/2014  . Obesity 10/31/2014  . Low back pain 03/09/2013  . Lumbar disc disease 03/01/2011  . Preventative health care 02/22/2011  . Diabetes (HCC) 06/03/2009  . Sleep apnea 02/03/2009  . ALLERGIC RHINITIS 12/31/2008   Michael C Sherk, PT, DPT # [id number] Josh , SPT 02/08/2018, 8:38 AM  [place] [facility] [address] [place], [place], [address] Phone: [phone]   Fax:  [phone]  Name: Tereso B Espinoza MRN: [id number] Date of Birth: 04/17/1971   

## 2018-02-08 ENCOUNTER — Encounter: Payer: Self-pay | Admitting: Physical Therapy

## 2018-02-14 ENCOUNTER — Other Ambulatory Visit: Payer: Self-pay | Admitting: Internal Medicine

## 2018-02-14 MED FILL — METFORMIN HCL ER 500 MG TAB: 500 | 90 days supply | Qty: 180 | Fill #0

## 2018-02-14 MED FILL — ACCU-CHEK FASTCLIX LANCETS: 26 days supply | Qty: 102 | Fill #2

## 2018-02-14 MED FILL — JANUVIA 100 MG TABLET: 100 | 90 days supply | Qty: 90 | Fill #0

## 2018-02-14 MED FILL — ACCU-CHEK GUIDE TEST STRIP: 25 days supply | Qty: 100 | Fill #2

## 2018-03-10 ENCOUNTER — Ambulatory Visit (INDEPENDENT_AMBULATORY_CARE_PROVIDER_SITE_OTHER): Payer: No Typology Code available for payment source | Admitting: Internal Medicine

## 2018-03-10 ENCOUNTER — Other Ambulatory Visit (INDEPENDENT_AMBULATORY_CARE_PROVIDER_SITE_OTHER): Payer: No Typology Code available for payment source

## 2018-03-10 ENCOUNTER — Encounter: Payer: Self-pay | Admitting: Internal Medicine

## 2018-03-10 VITALS — BP 118/76 | HR 82 | Temp 97.8°F | Ht 71.0 in | Wt 224.0 lb

## 2018-03-10 DIAGNOSIS — Z0000 Encounter for general adult medical examination without abnormal findings: Secondary | ICD-10-CM

## 2018-03-10 DIAGNOSIS — R7989 Other specified abnormal findings of blood chemistry: Secondary | ICD-10-CM | POA: Diagnosis not present

## 2018-03-10 DIAGNOSIS — R5383 Other fatigue: Secondary | ICD-10-CM | POA: Diagnosis not present

## 2018-03-10 DIAGNOSIS — E119 Type 2 diabetes mellitus without complications: Secondary | ICD-10-CM

## 2018-03-10 DIAGNOSIS — E785 Hyperlipidemia, unspecified: Secondary | ICD-10-CM | POA: Diagnosis not present

## 2018-03-10 DIAGNOSIS — Z23 Encounter for immunization: Secondary | ICD-10-CM | POA: Diagnosis not present

## 2018-03-10 LAB — HEPATIC FUNCTION PANEL
ALT: 28 U/L (ref 0–53)
AST: 17 U/L (ref 0–37)
Albumin: 4.6 g/dL (ref 3.5–5.2)
Alkaline Phosphatase: 65 U/L (ref 39–117)
BILIRUBIN TOTAL: 0.7 mg/dL (ref 0.2–1.2)
Bilirubin, Direct: 0.2 mg/dL (ref 0.0–0.3)
Total Protein: 7.1 g/dL (ref 6.0–8.3)

## 2018-03-10 LAB — LIPID PANEL
CHOLESTEROL: 128 mg/dL (ref 0–200)
HDL: 38.9 mg/dL — ABNORMAL LOW (ref >39.00)
LDL CALC: 63 mg/dL (ref 0–99)
NonHDL: 89.25
TRIGLYCERIDES: 129 mg/dL (ref 0.0–149.0)
Total CHOL/HDL Ratio: 3
VLDL: 25.8 mg/dL (ref 0.0–40.0)

## 2018-03-10 LAB — T4, FREE: FREE T4: 0.99 ng/dL (ref 0.60–1.60)

## 2018-03-10 LAB — TESTOSTERONE: TESTOSTERONE: 337.55 ng/dL (ref 300.00–890.00)

## 2018-03-10 LAB — BASIC METABOLIC PANEL
BUN: 14 mg/dL (ref 6–23)
CO2: 27 meq/L (ref 19–32)
Calcium: 10 mg/dL (ref 8.4–10.5)
Chloride: 101 mEq/L (ref 96–112)
Creatinine, Ser: 1.13 mg/dL (ref 0.40–1.50)
GFR: 73.71 mL/min (ref >60.00)
GLUCOSE: 217 mg/dL — AB (ref 70–99)
POTASSIUM: 4.5 meq/L (ref 3.5–5.1)
SODIUM: 138 meq/L (ref 135–145)

## 2018-03-10 LAB — TSH: TSH: 2.89 u[IU]/mL (ref 0.35–4.50)

## 2018-03-10 LAB — HEMOGLOBIN A1C: Hgb A1c MFr Bld: 7.4 % — ABNORMAL HIGH (ref 4.6–6.5)

## 2018-03-10 MED ORDER — METFORMIN HCL ER 500 MG PO TB24: 1500.0000 mg | ORAL_TABLET | Freq: Every day | ORAL | 3 refills | Status: DC

## 2018-03-10 NOTE — Patient Instructions (Addendum)
You had the flu shot today  OK to increase the metformin ER 500 mg to 3 per day  Please continue all other medications as before, and refills have been done if requested.  Please have the pharmacy call with any other refills you may need.  Please continue your efforts at being more active, low cholesterol diet, and weight control  Please keep your appointments with your specialists as you may have planned  Please go to the LAB in the Basement (turn left off the elevator) for the tests to be done today  You will be contacted by phone if any changes need to be made immediately.  Otherwise, you will receive a letter about your results with an explanation, but please check with MyChart first.  Please remember to sign up for MyChart if you have not done so, as this will be important to you in the future with finding out test results, communicating by private email, and scheduling acute appointments online when needed.  Please return in 6 months, or sooner if needed, with Lab testing done 3-5 days before

## 2018-03-10 NOTE — Assessment & Plan Note (Signed)
Asympt, for lab f/u 

## 2018-03-10 NOTE — Assessment & Plan Note (Signed)
Also for testosterone level ,  to f/u any worsening symptoms or concerns 

## 2018-03-10 NOTE — Progress Notes (Signed)
 Subjective:    Patient ID: [name], male    DOB: 05/18/1971, 47 y.o.   MRN: [id number]  HPI   Here to f/u; overall doing ok,  Pt denies chest pain, increasing sob or doe, wheezing, orthopnea, PND, increased LE swelling, palpitations, dizziness or syncope.  Pt denies new neurological symptoms such as new headache, or facial or extremity weakness or numbness.  Pt denies polydipsia, polyuria, or low sugar episode.  Pt states overall good compliance with meds, mostly trying to follow appropriate diet, with wt overall stable,  but little exercise however.  Only taking metformin ER 500 - 2 per day, misunderstood last recommendtation for  Per day in April 2019. Does c/o ongoing fatigue, but denies signficant daytime hypersomnolence. Wt Readings from Last 3 Encounters:  03/10/18 224 lb (101.6 kg)  12/27/17 226 lb (102.5 kg)  09/07/17 227 lb (103 kg)  Has had some hair thinning, wondering about thyroidf.  Of note has been intolerant of even low dose ACE in past with dizziness. Past Medical History:  Diagnosis Date  . ALLERGIC RHINITIS 12/31/2008  . DIABETES MELLITUS, TYPE II 06/03/2009  . Dyslipidemia 10/31/2014  . FOOT PAIN, RIGHT 08/08/2010  . HYPERSOMNIA 11/30/2008  . Lumbar disc disease 03/01/2011  . OBSTRUCTIVE SLEEP APNEA 02/03/2009  . OTHER INFLAMMATORY DISORDER MALE GENITAL ORGANS 11/30/2008   Past Surgical History:  Procedure Laterality Date  . HERNIA REPAIR  2012   [facility]    reports that he has never smoked. He has never used smokeless tobacco. He reports that he drinks alcohol. He reports that he does not use drugs. family history includes Cancer in his other; Coronary artery disease in his father; Depression in his maternal grandmother and mother; Diabetes in his father; Heart attack in his father; Hypertension in his father. Allergies  Allergen Reactions  . Penicillins     Childhood allergy   Current Outpatient Medications on File Prior to Visit  Medication Sig Dispense Refill  .  ACCU-CHEK FASTCLIX LANCETS MISC USE TO TEST BLOOD GLUCOSE UP TO 4 TIMES PER DAY 102 each 3  . ACCU-CHEK GUIDE test strip USE TO TEST BLOOD GLUCOSE UP TO 4 TIMES PER DAY 100 each 3  . Alcohol Swabs (B-D SINGLE USE SWABS REGULAR) PADS Use as directed    . aspirin 81 MG tablet Take 81 mg by mouth daily.      . blood glucose meter kit and supplies Dispense based on patient and insurance preference. Use up to four times daily as directed. **ICD10-E11.9** 1 each 0  . Blood Glucose Monitoring Suppl (TRUERESULT BLOOD GLUCOSE) W/DEVICE KIT by Does not apply route. Reported on 10/02/2015    . diclofenac (VOLTAREN) 75 MG EC tablet Take 1 tablet (75 mg total) by mouth 2 (two) times daily. 60 tablet 1  . ibuprofen (ADVIL,MOTRIN) 600 MG tablet Take 1 tablet (600 mg total) by mouth every 8 (eight) hours as needed. 60 tablet 1  . JANUVIA 100 MG tablet TAKE 1 TABLET BY MOUTH DAILY. 90 tablet 1  . Lancet Devices MISC by Does not apply route.    . NON FORMULARY Reported on 10/02/2015     No current facility-administered medications on file prior to visit.    Review of Systems  Constitutional: Negative for other unusual diaphoresis or sweats HENT: Negative for ear discharge or swelling Eyes: Negative for other worsening visual disturbances Respiratory: Negative for stridor or other swelling  Gastrointestinal: Negative for worsening distension or other blood Genitourinary: Negative for   retention or other urinary change Musculoskeletal: Negative for other MSK pain or swelling Skin: Negative for color change or other new lesions Neurological: Negative for worsening tremors and other numbness  Psychiatric/Behavioral: Negative for worsening agitation or other fatigue All other system neg per pt    Objective:   Physical Exam BP 118/76   Pulse 82   Temp 97.8 F (36.6 C) (Oral)   Ht 5' 11" (1.803 m)   Wt 224 lb (101.6 kg)   SpO2 98%   BMI 31.24 kg/m  VS noted,  Constitutional: Pt appears in NAD HENT: Head:  NCAT.  Right Ear: External ear normal.  Left Ear: External ear normal.  Eyes: . Pupils are equal, round, and reactive to light. Conjunctivae and EOM are normal Nose: without d/c or deformity Neck: Neck supple. Gross normal ROM Cardiovascular: Normal rate and regular rhythm.   Pulmonary/Chest: Effort normal and breath sounds without rales or wheezing.  Abd:  Soft, NT, ND, + BS, no organomegaly Neurological: Pt is alert. At baseline orientation, motor grossly intact Skin: Skin is warm. No rashes, other new lesions, no LE edema Psychiatric: Pt behavior is normal without agitation  No other exam findings Lab Results  Component Value Date   WBC 8.0 09/07/2017   HGB 16.9 09/07/2017   HCT 49.0 09/07/2017   PLT 190.0 09/07/2017   GLUCOSE 217 (H) 03/10/2018   CHOL 128 03/10/2018   TRIG 129.0 03/10/2018   HDL 38.90 (L) 03/10/2018   LDLDIRECT 70.0 03/05/2017   LDLCALC 63 03/10/2018   ALT 28 03/10/2018   AST 17 03/10/2018   NA 138 03/10/2018   K 4.5 03/10/2018   CL 101 03/10/2018   CREATININE 1.13 03/10/2018   BUN 14 03/10/2018   CO2 27 03/10/2018   TSH 2.89 03/10/2018   PSA 0.71 09/07/2017   HGBA1C 7.4 (H) 03/10/2018   MICROALBUR <0.7 09/07/2017      Assessment & Plan:

## 2018-03-10 NOTE — Assessment & Plan Note (Signed)
Mild uncontrolled, ok to increase the metformin to 3 qd, o/w stable overall by history and exam, recent data reviewed with pt, and pt to continue medical treatment as before,  to f/u any worsening symptoms or concerns

## 2018-03-10 NOTE — Assessment & Plan Note (Signed)
stable overall by history and exam, recent data reviewed with pt, and pt to continue medical treatment as before,  to f/u any worsening symptoms or concerns  

## 2018-04-11 ENCOUNTER — Encounter: Payer: Self-pay | Admitting: Internal Medicine

## 2018-04-11 LAB — HM DIABETES EYE EXAM

## 2018-04-20 MED FILL — metFORMIN HCL ER 500 MG TB2: 500 | 90 days supply | Qty: 270 | Fill #0

## 2018-06-13 MED FILL — JANUVIA 100 MG TABLET: 100 | 90 days supply | Qty: 90 | Fill #1

## 2018-07-18 MED FILL — metFORMIN HCL ER 500 MG TB2: 500 | 90 days supply | Qty: 270 | Fill #1

## 2018-08-23 ENCOUNTER — Ambulatory Visit: Payer: Self-pay | Admitting: Internal Medicine

## 2018-08-23 ENCOUNTER — Other Ambulatory Visit: Payer: Self-pay

## 2018-08-23 ENCOUNTER — Ambulatory Visit (INDEPENDENT_AMBULATORY_CARE_PROVIDER_SITE_OTHER): Payer: No Typology Code available for payment source | Admitting: Internal Medicine

## 2018-08-23 DIAGNOSIS — M25511 Pain in right shoulder: Secondary | ICD-10-CM | POA: Diagnosis not present

## 2018-08-23 DIAGNOSIS — R300 Dysuria: Secondary | ICD-10-CM | POA: Diagnosis not present

## 2018-08-23 DIAGNOSIS — E119 Type 2 diabetes mellitus without complications: Secondary | ICD-10-CM

## 2018-08-23 MED ORDER — DOXYCYCLINE HYCLATE 100 MG PO TABS: 100.0000 mg | ORAL_TABLET | Freq: Two times a day (BID) | ORAL | 0 refills | Status: DC

## 2018-08-23 NOTE — Progress Notes (Addendum)
Patient ID: [name], male   DOB: 01/29/1971, 48 y.o.   MRN: [id number]  Virtual Visit via Video Note  I connected with [name] on 08/23/18 at  1:40 PM EDT by a video enabled telemedicine application and verified that I am speaking with the correct person using two identifiers.   I discussed the limitations of evaluation and management by telemedicine and the availability of in person appointments. The patient expressed understanding and agreed to proceed.  History of Present Illness: Pt c/o 1 mo persistent moderate sharp right shoulder pain just not getting better, worse to abduct > 90 degrees, not better with anything thinks may have happened originally when slept wrong position, but just not getting better.  Does occasional heavy lifting rock and stone at work but cant recall any particular incident leading to pain, and no fall or trauma.  Has the other coworkers lifting the heavy things at work.    Also c/o 1 mo unusual GU symptoms with hard to localize but lower abd discomfort, a bit worse to urinate, with occasional incomplete urination, urinary frequency and even a few times of nocturia unusual for him.  Has also awoken with wet underwear from a d/c that spontaneously happened overnight and not assoc with sexual activity.  He states wife and he are monogamous for 25 yrs, and any kind of STD would be extremely unlikely.  No other penile d/c, fever and Denies urinary symptoms such as flank pain, hematuria or n/v, fever, chills.   Pt denies polydipsia, polyuria  Past Medical History:  Diagnosis Date  . ALLERGIC RHINITIS 12/31/2008  . DIABETES MELLITUS, TYPE II 06/03/2009  . Dyslipidemia 10/31/2014  . FOOT PAIN, RIGHT 08/08/2010  . HYPERSOMNIA 11/30/2008  . Lumbar disc disease 03/01/2011  . OBSTRUCTIVE SLEEP APNEA 02/03/2009  . OTHER INFLAMMATORY DISORDER MALE GENITAL ORGANS 11/30/2008   Past Surgical History:  Procedure Laterality Date  . HERNIA REPAIR  2012   [facility]    reports that he  has never smoked. He has never used smokeless tobacco. He reports current alcohol use. He reports that he does not use drugs. family history includes Cancer in an other family member; Coronary artery disease in his father; Depression in his maternal grandmother and mother; Diabetes in his father; Heart attack in his father; Hypertension in his father. Allergies  Allergen Reactions  . Penicillins     Childhood allergy   Current Outpatient Medications on File Prior to Visit  Medication Sig Dispense Refill  . ACCU-CHEK FASTCLIX LANCETS MISC USE TO TEST BLOOD GLUCOSE UP TO 4 TIMES PER DAY 102 each 3  . ACCU-CHEK GUIDE test strip USE TO TEST BLOOD GLUCOSE UP TO 4 TIMES PER DAY 100 each 3  . Alcohol Swabs (B-D SINGLE USE SWABS REGULAR) PADS Use as directed    . aspirin 81 MG tablet Take 81 mg by mouth daily.      . blood glucose meter kit and supplies Dispense based on patient and insurance preference. Use up to four times daily as directed. **ICD10-E11.9** 1 each 0  . Blood Glucose Monitoring Suppl (TRUERESULT BLOOD GLUCOSE) W/DEVICE KIT by Does not apply route. Reported on 10/02/2015    . diclofenac (VOLTAREN) 75 MG EC tablet Take 1 tablet (75 mg total) by mouth 2 (two) times daily. 60 tablet 1  . ibuprofen (ADVIL,MOTRIN) 600 MG tablet Take 1 tablet (600 mg total) by mouth every 8 (eight) hours as needed. 60 tablet 1  . JANUVIA 100 MG tablet  TAKE 1 TABLET BY MOUTH DAILY. 90 tablet 1  . Lancet Devices MISC by Does not apply route.    . metFORMIN (GLUCOPHAGE-XR) 500 MG 24 hr tablet Take 3 tablets (1,500 mg total) by mouth daily with breakfast. 270 tablet 3  . NON FORMULARY Reported on 10/02/2015     No current facility-administered medications on file prior to visit.    Observations/Objective: Alert, not ill appearing, cn 2-12 intact, unable to abduct the right arm > 90 degrees due to pain Lab Results  Component Value Date   WBC 8.0 09/07/2017   HGB 16.9 09/07/2017   HCT 49.0 09/07/2017   PLT  190.0 09/07/2017   GLUCOSE 217 (H) 03/10/2018   CHOL 128 03/10/2018   TRIG 129.0 03/10/2018   HDL 38.90 (L) 03/10/2018   LDLDIRECT 70.0 03/05/2017   LDLCALC 63 03/10/2018   ALT 28 03/10/2018   AST 17 03/10/2018   NA 138 03/10/2018   K 4.5 03/10/2018   CL 101 03/10/2018   CREATININE 1.13 03/10/2018   BUN 14 03/10/2018   CO2 27 03/10/2018   TSH 2.89 03/10/2018   PSA 0.71 09/07/2017   HGBA1C 7.4 (H) 03/10/2018   MICROALBUR <0.7 09/07/2017   Assessment and Plan: See below  Follow Up Instructions: See below   I discussed the assessment and treatment plan with the patient. The patient was provided an opportunity to ask questions and all were answered. The patient agreed with the plan and demonstrated an understanding of the instructions.   The patient was advised to call back or seek an in-person evaluation if the symptoms worsen or if the condition fails to improve as anticipated.   Cathlean Cower, MD

## 2018-08-23 NOTE — Patient Instructions (Signed)
Please take all new medication as prescribed - the antibiotic  You will be contacted regarding the referral for: Sports Medicine  Please continue all other medications as before, and refills have been done if requested.  Please have the pharmacy call with any other refills you may need.  Please keep your appointments with your specialists as you may have planned  Please go to the LAB in the Basement (turn left off the elevator) for the tests to be done in the AM  You will be contacted by phone if any changes need to be made immediately.  Otherwise, you will receive a letter about your results with an explanation, but please check with MyChart first.  In order to help keep our patients safe and at home we are billing the insurance company for a health consult. You could potentially get a copay to a maximum of $15. Do you agree to this in order to obtain our advice about your concerns?

## 2018-08-23 NOTE — Telephone Encounter (Signed)
Please schedule as an virtual visit. Thanks!

## 2018-08-23 NOTE — Assessment & Plan Note (Signed)
stable overall by history and exam, recent data reviewed with pt, and pt to continue medical treatment as before,  to f/u any worsening symptoms or concerns  

## 2018-08-23 NOTE — Assessment & Plan Note (Signed)
Differential includes UTI vs prostatitis, the latter more likely it seem by hx; will ask pt to come to lab in AM for urine studies, and also sent 3 wk course doxycycline to start after urine testing

## 2018-08-23 NOTE — Assessment & Plan Note (Signed)
I suspect rotater cuff disease, though cant r/o impingement syndrome; declines need for pain med, to avoid heavy lifting, and I will refer sports medicine

## 2018-08-23 NOTE — Telephone Encounter (Signed)
Noted  

## 2018-08-23 NOTE — Telephone Encounter (Signed)
Contacted pt regarding his concerns; he says that: 1. Pt hurt his right shoulder 3 months ago and he can't lift his shoulder over his head, he would like to see someone about this or get a referral out;  and over the past month:  2. Pt is having a hard time initiating a stream of urine, and he feels like he is not fully emptying his bladder; also getting up at night to use bathroom, and occasional  burning sensation when he urinates 3. Discharge of semen from penis; tenderness of testicles The pt says that his wife told him that his symptoms may be related to his diabetes; he says his blood sugars range from 150-300s, and his metformin has been increased; he also says that he is feeling more tired; explained to pt that this may be a virtual visit; his email is [name], and [phone] and a message can be left; pt normally sees Dr Oliver Barre, [facility]; will route to office for noadvicetification, and final disposition.  Reason for Disposition . Requesting regular office appointment  Answer Assessment - Initial Assessment Questions 1. REASON FOR CALL or QUESTION: "What is your reason for calling today?" or "How can I best help you?" or "What question do you have that I can help answer?"     Pt would like to see MD about numerous concerns  Protocols used: INFORMATION ONLY CALL-A-AH

## 2018-08-23 NOTE — Telephone Encounter (Signed)
Patient scheduled.

## 2018-08-24 ENCOUNTER — Other Ambulatory Visit: Payer: Self-pay | Admitting: Internal Medicine

## 2018-08-24 ENCOUNTER — Encounter: Payer: Self-pay | Admitting: Internal Medicine

## 2018-08-24 ENCOUNTER — Other Ambulatory Visit (INDEPENDENT_AMBULATORY_CARE_PROVIDER_SITE_OTHER): Payer: No Typology Code available for payment source

## 2018-08-24 ENCOUNTER — Telehealth: Payer: Self-pay

## 2018-08-24 DIAGNOSIS — R300 Dysuria: Secondary | ICD-10-CM

## 2018-08-24 DIAGNOSIS — Z0000 Encounter for general adult medical examination without abnormal findings: Secondary | ICD-10-CM | POA: Diagnosis not present

## 2018-08-24 DIAGNOSIS — E119 Type 2 diabetes mellitus without complications: Secondary | ICD-10-CM | POA: Diagnosis not present

## 2018-08-24 LAB — MICROALBUMIN / CREATININE URINE RATIO
Creatinine,U: 143.1 mg/dL
Microalb Creat Ratio: 0.5 mg/g (ref 0.0–30.0)
Microalb, Ur: <0.7 mg/dL (ref 0.0–1.9)

## 2018-08-24 LAB — URINALYSIS, ROUTINE W REFLEX MICROSCOPIC
Bilirubin Urine: NEGATIVE
Hgb urine dipstick: NEGATIVE
Ketones, ur: NEGATIVE
Leukocytes,Ua: NEGATIVE
Nitrite: NEGATIVE
RBC / HPF: NONE SEEN (ref 0–?)
Specific Gravity, Urine: 1.025 (ref 1.000–1.030)
Total Protein, Urine: NEGATIVE
Urine Glucose: NEGATIVE
Urobilinogen, UA: 0.2 (ref 0.0–1.0)
pH: 5.5 (ref 5.0–8.0)

## 2018-08-24 LAB — HEPATIC FUNCTION PANEL
ALT: 22 U/L (ref 0–53)
AST: 16 U/L (ref 0–37)
Albumin: 4.4 g/dL (ref 3.5–5.2)
Alkaline Phosphatase: 55 U/L (ref 39–117)
Bilirubin, Direct: 0.2 mg/dL (ref 0.0–0.3)
Total Bilirubin: 0.7 mg/dL (ref 0.2–1.2)
Total Protein: 6.6 g/dL (ref 6.0–8.3)

## 2018-08-24 LAB — CBC WITH DIFFERENTIAL/PLATELET
Basophils Absolute: 0 10*3/uL (ref 0.0–0.1)
Basophils Relative: 0.5 % (ref 0.0–3.0)
Eosinophils Absolute: 0.2 10*3/uL (ref 0.0–0.7)
Eosinophils Relative: 2.5 % (ref 0.0–5.0)
HCT: 47.3 % (ref 39.0–52.0)
Hemoglobin: 16.6 g/dL (ref 13.0–17.0)
Lymphocytes Relative: 30.1 % (ref 12.0–46.0)
Lymphs Abs: 2.7 10*3/uL (ref 0.7–4.0)
MCHC: 35 g/dL (ref 30.0–36.0)
MCV: 88.8 fl (ref 78.0–100.0)
Monocytes Absolute: 0.8 10*3/uL (ref 0.1–1.0)
Monocytes Relative: 9.3 % (ref 3.0–12.0)
Neutro Abs: 5.1 10*3/uL (ref 1.4–7.7)
Neutrophils Relative %: 57.6 % (ref 43.0–77.0)
Platelets: 211 10*3/uL (ref 150.0–400.0)
RBC: 5.32 Mil/uL (ref 4.22–5.81)
RDW: 14 % (ref 11.5–15.5)
WBC: 8.9 10*3/uL (ref 4.0–10.5)

## 2018-08-24 LAB — BASIC METABOLIC PANEL
BUN: 20 mg/dL (ref 6–23)
CO2: 25 mEq/L (ref 19–32)
Calcium: 9.5 mg/dL (ref 8.4–10.5)
Chloride: 101 mEq/L (ref 96–112)
Creatinine, Ser: 1.23 mg/dL (ref 0.40–1.50)
GFR: 62.77 mL/min (ref >60.00)
Glucose, Bld: 173 mg/dL — ABNORMAL HIGH (ref 70–99)
Potassium: 4.7 mEq/L (ref 3.5–5.1)
Sodium: 137 mEq/L (ref 135–145)

## 2018-08-24 LAB — LIPID PANEL
Cholesterol: 120 mg/dL (ref 0–200)
HDL: 34 mg/dL — ABNORMAL LOW (ref >39.00)
LDL Cholesterol: 56 mg/dL (ref 0–99)
NonHDL: 85.75
Total CHOL/HDL Ratio: 4
Triglycerides: 148 mg/dL (ref 0.0–149.0)
VLDL: 29.6 mg/dL (ref 0.0–40.0)

## 2018-08-24 LAB — TSH: TSH: 4.43 u[IU]/mL (ref 0.35–4.50)

## 2018-08-24 LAB — HEMOGLOBIN A1C: Hgb A1c MFr Bld: 7.6 % — ABNORMAL HIGH (ref 4.6–6.5)

## 2018-08-24 LAB — PSA: PSA: 0.65 ng/mL (ref 0.10–4.00)

## 2018-08-24 MED ORDER — DULAGLUTIDE 0.75 MG/0.5ML ~~LOC~~ SOAJ: 0.5000 mL | SUBCUTANEOUS | 3 refills | Status: DC

## 2018-08-24 NOTE — Telephone Encounter (Signed)
Pt has been informed of results and expressed understanding.  °

## 2018-08-24 NOTE — Telephone Encounter (Signed)
-----   Message from Corwin Levins, MD sent at 08/24/2018  2:30 PM EDT ----- Left message on MyChart, pt to cont same tx except  The test results show that your current treatment is OK, including the urine testing, except the A1c is again mildly elevated.  Please add Trulicity injections once weekly to help reduce the sugar (or similar medication if this is not well covered with your insurance).  I will send the prescription, and you should hear from the office as well.    Shirron to please inform pt, I will do rx

## 2018-08-25 LAB — URINE CULTURE
MICRO NUMBER:: 368490
Result:: NO GROWTH
SPECIMEN QUALITY:: ADEQUATE

## 2018-08-26 ENCOUNTER — Ambulatory Visit: Payer: Self-pay | Admitting: *Deleted

## 2018-08-26 ENCOUNTER — Other Ambulatory Visit: Payer: Self-pay | Admitting: Internal Medicine

## 2018-08-26 MED FILL — JANUVIA 100 MG TABLET: 100 | 90 days supply | Qty: 90 | Fill #0

## 2018-08-26 MED FILL — ACCU-CHEK GUIDE TEST STRIP: 25 days supply | Qty: 100 | Fill #0

## 2018-08-26 MED FILL — ACCU-CHEK FASTCLIX LANCETS: 26 days supply | Qty: 102 | Fill #0

## 2018-08-26 NOTE — Telephone Encounter (Addendum)
This is coincidental, but the antibiotic he just started for prostatitis is the same one we use for tick related infevction.  T  Ok to continue the same antibiotic, and no other psecific tx is necessary (very unlikely to need topical antibiotic)  Please also let him know, we got the urine culture result back this AM which was negative, but this is common if the infection Is not bladder related

## 2018-08-26 NOTE — Telephone Encounter (Signed)
Called pt, LVM to discuss.  

## 2018-08-26 NOTE — Telephone Encounter (Signed)
Message from Fanny Bien sent at 08/26/2018 7:42 AM EDT   Summary: Tick bite    Pt called and states that he has a tick bite and would like to know what he should do about this. Please advise           Pt scratched leg this morning, thinking it was a bump. He discovered a tick, thinks a deer tick. It was on the right inner thigh just above the knee. He put it in a bag. The area is red and his wife marked it to see if it changes. Denies symptoms. Advised that if he starts having headache, fever or any symptoms to call back. Also to make sure he cleans the area and use antibiotic ointment to the area. Pt voiced understanding. Routing to flow at [facility] for review/recommnedations.    Reason for Disposition . Tick bite with no complications  Answer Assessment - Initial Assessment Questions 1. TYPE of TICK: "Is it a wood tick or a deer tick?" If unsure, ask: "What size was the tick?" "Did it look more like a watermelon seed or a poppy seed?"      Deer tick 2. LOCATION: "Where is the tick bite located?"      Right leg, inner thigh, just above the knee 3. ONSET: "How long do you think the tick was attached before you removed it?" (Hours or days)      Over night, (he thinks from yesterday) 4. TETANUS: "When was the last tetanus booster?"      11/2008 5. PREGNANCY: "Is there any chance you are pregnant?" "When was your last menstrual period?"     n/a  Protocols used: TICK BITE-A-AH

## 2018-08-28 NOTE — Progress Notes (Signed)
[name] [name], [place] [address] Phone: [phone] Subjective:    I'm seeing this patient by the request  of:  Joshua Borg, MD   CC: Right shoulder pain  YME:BRAXENMMHW  [name] is a 48 y.o. male coming in with complaint of right shoulder pain. No numbness and tingling noted. Remembers sleeping wrong on his arm. Woke up with a sharp pain in his shoulder and his fingers numb.   Onset- Since before christmas  Location- joint  Duration-patient states that it is usually on a daily basis but worse at night Character- achy with no activity. Sharp with activity  Aggravating factors- ADLs, reaching with pronation Reliving factors-certain range of motion. Therapies tried-  Severity-7 out of 10     Past Medical History:  Diagnosis Date  . ALLERGIC RHINITIS 12/31/2008  . DIABETES MELLITUS, TYPE II 06/03/2009  . Dyslipidemia 10/31/2014  . FOOT PAIN, RIGHT 08/08/2010  . HYPERSOMNIA 11/30/2008  . Lumbar disc disease 03/01/2011  . OBSTRUCTIVE SLEEP APNEA 02/03/2009  . OTHER INFLAMMATORY DISORDER MALE GENITAL ORGANS 11/30/2008   Past Surgical History:  Procedure Laterality Date  . HERNIA REPAIR  2012   [facility]   Social History   Socioeconomic History  . Marital status: Married    Spouse name: Not on file  . Number of children: 2  . Years of education: Not on file  . Highest education level: Not on file  Occupational History  . Occupation: Press photographer  [employer]    Employer: [employer]  Social Needs  . Financial resource strain: Not on file  . Food insecurity:    Worry: Not on file    Inability: Not on file  . Transportation needs:    Medical: Not on file    Non-medical: Not on file  Tobacco Use  . Smoking status: Never Smoker  . Smokeless tobacco: Never Used  Substance and Sexual Activity  . Alcohol use: Yes    Comment: social  . Drug use: No  . Sexual activity: Not on file  Lifestyle  . Physical activity:     Days per week: Not on file    Minutes per session: Not on file  . Stress: Not on file  Relationships  . Social connections:    Talks on phone: Not on file    Gets together: Not on file    Attends religious service: Not on file    Active member of club or organization: Not on file    Attends meetings of clubs or organizations: Not on file    Relationship status: Not on file  Other Topics Concern  . Not on file  Social History Narrative  . Not on file   Allergies  Allergen Reactions  . Penicillins     Childhood allergy   Family History  Problem Relation Age of Onset  . Depression Mother   . Diabetes Father   . Hypertension Father   . Heart attack Father   . Coronary artery disease Father   . Depression Maternal Grandmother   . Cancer Other        facial skin cancers    Current Outpatient Medications (Endocrine & Metabolic):  [name]  Dulaglutide (TRULICITY) 8.08 UP/1.0RP SOPN, Inject 0.5 mLs into the skin once a week. [name]  JANUVIA 100 MG tablet, TAKE 1 TABLET BY MOUTH DAILY. .  metFORMIN (GLUCOPHAGE-XR) 500 MG 24 hr tablet, Take 3 tablets (1,500 mg total) by  mouth daily with breakfast.  Current Outpatient Medications (Cardiovascular):  .  nitroGLYCERIN (NITRODUR - DOSED IN MG/24 HR) 0.2 mg/hr patch, 1/4 patch daily   Current Outpatient Medications (Analgesics):  .  aspirin 81 MG tablet, Take 81 mg by mouth daily.   .  diclofenac (VOLTAREN) 75 MG EC tablet, Take 1 tablet (75 mg total) by mouth 2 (two) times daily. [name]  ibuprofen (ADVIL,MOTRIN) 600 MG tablet, Take 1 tablet (600 mg total) by mouth every 8 (eight) hours as needed.   Current Outpatient Medications (Other):  [name]  Accu-Chek FastClix Lancets MISC, USE TO TEST BLOOD GLUCOSE UP TO 4 TIMES PER DAY .  ACCU-CHEK GUIDE test strip, USE TO TEST BLOOD GLUCOSE UP TO 4 TIMES PER DAY .  Alcohol Swabs (B-D SINGLE USE SWABS REGULAR) PADS, Use as directed .  blood glucose meter kit and supplies, Dispense based on patient and  insurance preference. Use up to four times daily as directed. **ICD10-E11.9** .  Blood Glucose Monitoring Suppl (TRUERESULT BLOOD GLUCOSE) W/DEVICE KIT, by Does not apply route. Reported on 10/02/2015 .  doxycycline (VIBRA-TABS) 100 MG tablet, Take 1 tablet (100 mg total) by mouth 2 (two) times daily. [name] Devices MISC, by Does not apply route. [name] Gouty, Reported on 10/02/2015 .  gabapentin (NEURONTIN) 100 MG capsule, Take 2 capsules (200 mg total) by mouth at bedtime. .  Vitamin D, Ergocalciferol, (DRISDOL) 1.25 MG (50000 UT) CAPS capsule, Take 1 capsule (50,000 Units total) by mouth every 7 (seven) days.    Past medical history, social, surgical and family history all reviewed in electronic medical record.  No pertanent information unless stated regarding to the chief complaint.   Review of Systems:  No headache, visual changes, nausea, vomiting, diarrhea, constipation, dizziness, abdominal pain, skin rash, fevers, chills, night sweats, weight loss, swollen lymph nodes, body aches, joint swelling,  chest pain, shortness of breath, mood changes.  Positive muscle aches  Objective  Blood pressure 110/70, pulse 87, height _0  (1.803 m), weight 215 lb (97.5 kg), SpO2 98 %.    General: No apparent distress alert and oriented x3 mood and affect normal, dressed appropriately.  HEENT: Pupils equal, extraocular movements intact  Respiratory: Patient's speak in full sentences and does not appear short of breath  Cardiovascular: No lower extremity edema, non tender, no erythema  Skin: Warm dry intact with no signs of infection or rash on extremities or on axial skeleton.  Abdomen: Soft nontender  Neuro: Cranial nerves II through XII are intact, neurovascularly intact in all extremities with 2+ DTRs and 2+ pulses.  Lymph: No lymphadenopathy of posterior or anterior cervical chain or axillae bilaterally.  Gait normal with good balance and coordination.  MSK:  Non tender with full range  of motion and good stability and symmetric strength and tone of  elbows, wrist, hip, knee and ankles bilaterally.  Neck: Inspection loss of lordosis. No palpable stepoffs. Negative Spurling's maneuver. Full neck range of motion Grip strength and sensation normal in bilateral hands Strength good C4 to T1 distribution No sensory change to C4 to T1 Negative Hoffman sign bilaterally Reflexes normal  Patient does have some scapular dyskinesis on the right side and does have some what appears to be winging of the scapula inferiorly noted  Shoulder: Right Inspection reveals mild atrophy of the right shoulder girdle Palpation is normal with no tenderness over AC joint or bicipital groove. ROM is full in all planes passively. Rotator cuff strength normal throughout. signs  of impingement with positive Neer and Hawkin's tests, but negative empty can sign. Speeds and Yergason's tests normal. Positive crossover sign pain over the acromioclavicular joint Normal scapular function observed. No painful arc and no drop arm sign. No apprehension sign Anterolateral shoulder unremarkable  MSK US performed of: Right This study was ordered, performed, and interpreted by Charlann Boxer D.O.  Shoulder:   Supraspinatus:  Appears normal on long and transverse views, Bursal bulge seen with shoulder abduction on impingement view. Subscapularis:  Appears normal on long and transverse views. Positive bursa AC joint: Severe distention of the capsule arthritic changes Glenohumeral Joint:  Appears normal without effusion. Glenoid Labrum:  Intact without visualized tears. Biceps Tendon:  Appears normal on long and transverse views, no fraying of tendon, tendon located in intertubercular groove, no subluxation with shoulder internal or external rotation.  Impression: Subacromial bursitis  Procedure: Real-time Ultrasound Guided Injection of right acjoint Device: GE Logiq E  Ultrasound guided injection is preferred  based studies that show increased duration, increased effect, greater accuracy, decreased procedural pain, increased response rate with ultrasound guided versus blind injection.  Verbal informed consent obtained.  Time-out conducted.  Noted no overlying erythema, induration, or other signs of local infection.  Skin prepped in a sterile fashion.  Local anesthesia: Topical Ethyl chloride.  With sterile technique and under real time ultrasound guidance:  Joint visualized.  With a 25-gauge half inch needle injected with 0.5 cc of 0.5% Marcaine and 0.5 cc of Kenalog 40 mg/mL Completed without difficulty  Pain immediately resolved suggesting accurate placement of the medication.  Advised to call if fevers/chills, erythema, induration, drainage, or persistent bleeding.  Images permanently stored and available for review in the ultrasound unit.  Impression: Technically successful ultrasound guided injection.  [id number]; 15 additional minutes spent for Therapeutic exercises as stated in above notes.  This included exercises focusing on stretching, strengthening, with significant focus on eccentric aspects.   Long term goals include an improvement in range of motion, strength, endurance as well as avoiding reinjury. Patient's frequency would include in 1-2 times a day, 3-5 times a week for a duration of 6-12 weeks. Shoulder Exercises that included:  Basic scapular stabilization to include adduction and depression of scapula Scaption, focusing on proper movement and good control Internal and External rotation utilizing a theraband, with elbow tucked at side entire time Rows with theraband given   Proper technique shown and discussed handout in great detail with ATC.  All questions were discussed and answered.     Impression and Recommendations:     This case required medical decision making of moderate complexity. The above documentation has been reviewed and is accurate and complete Lyndal Pulley, DO        Note: This dictation was prepared with Dragon dictation along with smaller phrase technology. Any transcriptional errors that result from this process are unintentional.

## 2018-08-29 ENCOUNTER — Encounter: Payer: Self-pay | Admitting: Family Medicine

## 2018-08-29 ENCOUNTER — Ambulatory Visit: Payer: No Typology Code available for payment source | Admitting: Family Medicine

## 2018-08-29 ENCOUNTER — Ambulatory Visit: Payer: Self-pay

## 2018-08-29 ENCOUNTER — Other Ambulatory Visit: Payer: Self-pay

## 2018-08-29 VITALS — BP 110/70 | HR 87 | Ht 71.0 in | Wt 215.0 lb

## 2018-08-29 DIAGNOSIS — M7530 Calcific tendinitis of unspecified shoulder: Secondary | ICD-10-CM | POA: Diagnosis not present

## 2018-08-29 DIAGNOSIS — M25511 Pain in right shoulder: Secondary | ICD-10-CM | POA: Diagnosis not present

## 2018-08-29 DIAGNOSIS — G8929 Other chronic pain: Secondary | ICD-10-CM

## 2018-08-29 DIAGNOSIS — M19011 Primary osteoarthritis, right shoulder: Secondary | ICD-10-CM | POA: Insufficient documentation

## 2018-08-29 MED ORDER — GABAPENTIN 100 MG PO CAPS: 200.0000 mg | ORAL_CAPSULE | Freq: Every day | ORAL | 3 refills | Status: DC

## 2018-08-29 MED ORDER — NITROGLYCERIN 0.2 MG/HR TD PT24: MEDICATED_PATCH | TRANSDERMAL | 1 refills | Status: AC

## 2018-08-29 MED ORDER — VITAMIN D (ERGOCALCIFEROL) 1.25 MG (50000 UNIT) PO CAPS: 50000.0000 [IU] | ORAL_CAPSULE | ORAL | 0 refills | Status: DC

## 2018-08-29 MED FILL — GABAPENTIN 100 MG CAPSULE: 100 | 30 days supply | Qty: 60 | Fill #0

## 2018-08-29 MED FILL — NITROGLYCERIN 0.2 MG/HR PTC: 0.2 | 88 days supply | Qty: 22 | Fill #0

## 2018-08-29 MED FILL — VIT D2 1.25 MG (50,000 UNIT: 1.25 MG | 84 days supply | Qty: 12 | Fill #0

## 2018-08-29 NOTE — Patient Instructions (Addendum)
Good to see you.  Ice 20 minutes 2 times daily. Usually after activity and before bed. Exercises 3 times a week.  Keep hands within peripheral vision   Gabapentin 200mg  at night for any nerve pain  Once weekly vitamin D for 12 weeks Nitroglycerin Protocol   Apply 1/4 nitroglycerin patch to affected area daily.  Change position of patch within the affected area every 24 hours.  You may experience a headache during the first 1-2 weeks of using the patch, these should subside.  If you experience headaches after beginning nitroglycerin patch treatment, you may take your preferred over the counter pain reliever.  Another side effect of the nitroglycerin patch is skin irritation or rash related to patch adhesive.  Please notify our office if you develop more severe headaches or rash, and stop the patch.  Tendon healing with nitroglycerin patch may require 12 to 24 weeks depending on the extent of injury.  Men should not use if taking Viagra, Cialis, or Levitra.   Do not use if you have migraines or rosacea.  Injected AC joint today as well  See me again in 5-6 weeks

## 2018-08-29 NOTE — Assessment & Plan Note (Signed)
Patient given injection and tolerated the procedure well.  Discussed icing regimen and home exercise.  We discussed nitroglycerin patches for the partial tear noted.  Patient also had calcific changes of the rotator cuff given vitamin D.  Concern for potential weakness noted and gabapentin given if there is any cervical radiculopathy that is contributing as well.  Discussed icing regimen and home exercise.  Patient will follow-up with me again in 6 weeks

## 2018-08-29 NOTE — Assessment & Plan Note (Signed)
Calcific bursitis.  Worsening symptoms will consider the possibility of injection.  Hopefully patient will respond well.  Follow-up again in 4 to 8 weeks

## 2018-09-14 ENCOUNTER — Ambulatory Visit (INDEPENDENT_AMBULATORY_CARE_PROVIDER_SITE_OTHER): Payer: No Typology Code available for payment source | Admitting: Internal Medicine

## 2018-09-14 ENCOUNTER — Encounter: Payer: Self-pay | Admitting: Internal Medicine

## 2018-09-14 VITALS — Ht 71.0 in

## 2018-09-14 DIAGNOSIS — Z0000 Encounter for general adult medical examination without abnormal findings: Secondary | ICD-10-CM

## 2018-09-14 DIAGNOSIS — E119 Type 2 diabetes mellitus without complications: Secondary | ICD-10-CM | POA: Diagnosis not present

## 2018-09-14 NOTE — Assessment & Plan Note (Addendum)
Declines trulicity or other further tx at this time though a1c mildly elevated, .o/w stable overall by history and exam, recent data reviewed with pt, and pt to continue medical treatment as before,  to f/u any worsening symptoms or concerns, for f/u lab at 66mo

## 2018-09-14 NOTE — Assessment & Plan Note (Signed)

## 2018-09-14 NOTE — Progress Notes (Signed)
Patient ID: [name], male   DOB: 23-Jul-1970, 48 y.o.   MRN: [id number]  Virtual Visit via Video Note  I connected with [name] on 09/14/18 at  8:00 AM EDT by a video enabled telemedicine application and verified that I am speaking with the correct person using two identifiers. I am at the office, pt is in his car, and no other persons present   I discussed the limitations of evaluation and management by telemedicine and the availability of in person appointments. The patient expressed understanding and agreed to proceed.  History of Present Illness: Here for wellness and f/u;  Overall doing ok;  Pt denies Chest pain, worsening SOB, DOE, wheezing, orthopnea, PND, worsening LE edema, palpitations, dizziness or syncope.  Pt denies neurological change such as new headache, facial or extremity weakness.  Pt denies polydipsia, polyuria, or low sugar symptoms. Pt states overall good compliance with treatment and medications, good tolerability, and has been trying to follow appropriate diet.  Pt denies worsening depressive symptoms, suicidal ideation or panic. No fever, night sweats, wt loss, loss of appetite, or other constitutional symptoms.  Pt states good ability with ADL's, has low fall risk, home safety reviewed and adequate, no other significant changes in hearing or vision, and only occasionally active with exercise.   More active lately and sugars improved abgain from high 100's to low 100's.  Even able to lose some wt.  Goal wt is about 205 per pt.  Never did try the trulicity, admits to not doing well over the last winter months with diet, but plans to do better and keep losing wt with being more active BP Readings from Last 3 Encounters:  08/29/18 110/70  03/10/18 118/76  12/27/17 124/86   Wt Readings from Last 3 Encounters:  08/29/18 215 lb (97.5 kg)  03/10/18 224 lb (101.6 kg)  12/27/17 226 lb (102.5 kg)   Past Medical History:  Diagnosis Date  . ALLERGIC RHINITIS 12/31/2008  .  DIABETES MELLITUS, TYPE II 06/03/2009  . Dyslipidemia 10/31/2014  . FOOT PAIN, RIGHT 08/08/2010  . HYPERSOMNIA 11/30/2008  . Lumbar disc disease 03/01/2011  . OBSTRUCTIVE SLEEP APNEA 02/03/2009  . OTHER INFLAMMATORY DISORDER MALE GENITAL ORGANS 11/30/2008   Past Surgical History:  Procedure Laterality Date  . HERNIA REPAIR  2012   [facility]    reports that he has never smoked. He has never used smokeless tobacco. He reports current alcohol use. He reports that he does not use drugs. family history includes Cancer in an other family member; Coronary artery disease in his father; Depression in his maternal grandmother and mother; Diabetes in his father; Heart attack in his father; Hypertension in his father. Allergies  Allergen Reactions  . Penicillins     Childhood allergy   Current Outpatient Medications on File Prior to Visit  Medication Sig Dispense Refill  . Accu-Chek FastClix Lancets MISC USE TO TEST BLOOD GLUCOSE UP TO 4 TIMES PER DAY 102 each 3  . ACCU-CHEK GUIDE test strip USE TO TEST BLOOD GLUCOSE UP TO 4 TIMES PER DAY 100 each 3  . Alcohol Swabs (B-D SINGLE USE SWABS REGULAR) PADS Use as directed    . aspirin 81 MG tablet Take 81 mg by mouth daily.      . blood glucose meter kit and supplies Dispense based on patient and insurance preference. Use up to four times daily as directed. **ICD10-E11.9** 1 each 0  . Blood Glucose Monitoring Suppl (TRUERESULT BLOOD GLUCOSE) W/DEVICE KIT by  Does not apply route. Reported on 10/02/2015    . diclofenac (VOLTAREN) 75 MG EC tablet Take 1 tablet (75 mg total) by mouth 2 (two) times daily. 60 tablet 1  . doxycycline (VIBRA-TABS) 100 MG tablet Take 1 tablet (100 mg total) by mouth 2 (two) times daily. 42 tablet 0  . Dulaglutide (TRULICITY) 3.55 HR/4.1UL SOPN Inject 0.5 mLs into the skin once a week. 6 mL 3  . gabapentin (NEURONTIN) 100 MG capsule Take 2 capsules (200 mg total) by mouth at bedtime. 60 capsule 3  . ibuprofen (ADVIL,MOTRIN) 600 MG tablet  Take 1 tablet (600 mg total) by mouth every 8 (eight) hours as needed. 60 tablet 1  . JANUVIA 100 MG tablet TAKE 1 TABLET BY MOUTH DAILY. 90 tablet 1  . Lancet Devices MISC by Does not apply route.    . metFORMIN (GLUCOPHAGE-XR) 500 MG 24 hr tablet Take 3 tablets (1,500 mg total) by mouth daily with breakfast. 270 tablet 3  . nitroGLYCERIN (NITRODUR - DOSED IN MG/24 HR) 0.2 mg/hr patch 1/4 patch daily 30 patch 1  . NON FORMULARY Reported on 10/02/2015    . Vitamin D, Ergocalciferol, (DRISDOL) 1.25 MG (50000 UT) CAPS capsule Take 1 capsule (50,000 Units total) by mouth every 7 (seven) days. 12 capsule 0   No current facility-administered medications on file prior to visit.    Observations/Objective: Alert, NAD, appropriate affect and mood, obese, resp normal, cn 2-12 intact, motor moves all 4s. No visible rash or swelling Lab Results  Component Value Date   WBC 8.9 08/24/2018   HGB 16.6 08/24/2018   HCT 47.3 08/24/2018   PLT 211.0 08/24/2018   GLUCOSE 173 (H) 08/24/2018   CHOL 120 08/24/2018   TRIG 148.0 08/24/2018   HDL 34.00 (L) 08/24/2018   LDLDIRECT 70.0 03/05/2017   LDLCALC 56 08/24/2018   ALT 22 08/24/2018   AST 16 08/24/2018   NA 137 08/24/2018   K 4.7 08/24/2018   CL 101 08/24/2018   CREATININE 1.23 08/24/2018   BUN 20 08/24/2018   CO2 25 08/24/2018   TSH 4.43 08/24/2018   PSA 0.65 08/24/2018   HGBA1C 7.6 (H) 08/24/2018   MICROALBUR <0.7 08/24/2018   Assessment and Plan: See notes  Follow Up Instructions: See notes   I discussed the assessment and treatment plan with the patient. The patient was provided an opportunity to ask questions and all were answered. The patient agreed with the plan and demonstrated an understanding of the instructions.   The patient was advised to call back or seek an in-person evaluation if the symptoms worsen or if the condition fails to improve as anticipated.   Cathlean Cower, MD

## 2018-09-14 NOTE — Patient Instructions (Addendum)
Please continue all other medications as before, and refills have been done if requested.  Please have the pharmacy call with any other refills you may need.  Please continue your efforts at being more active, low cholesterol diet, and weight control.  You are otherwise up to date with prevention measures today.  Please keep your appointments with your specialists as you may have planned  Please return in 6 months, or sooner if needed, with Lab testing done 3-5 days before  

## 2018-09-15 LAB — HM DIABETES EYE EXAM

## 2018-09-20 ENCOUNTER — Ambulatory Visit: Payer: Self-pay | Admitting: Internal Medicine

## 2018-09-20 DIAGNOSIS — R310 Gross hematuria: Secondary | ICD-10-CM

## 2018-09-20 NOTE — Telephone Encounter (Signed)
Last UA (and all others) have not shown blood on microscopy  Please ask pt to come to lab for UA to check this  I will go ahead and refer to urology as well.  If he has flank or abd pain, he should have virtual visit to rule out possible renal stone

## 2018-09-20 NOTE — Telephone Encounter (Signed)
  Pt called in c/o having a few drops of blood in his urine that started last night and happened again this morning.  He has done 2 virtual visits with Dr. Jonny Ruiz pertaining to urinary symptoms recently.  See triage notes.  I sent these notes to Dr. Raphael Gibney office for further disposition.  Reason for Disposition . Blood in urine  (Exception: could be normal menstrual bleeding)  Answer Assessment - Initial Assessment Questions 1. COLOR of URINE: "Describe the color of the urine."  (e.g., tea-colored, pink, red, blood clots, bloody)     It's more drops.   2. ONSET: "When did the bleeding start?"      A couple of weeks I told Dr. Jonny Ruiz I was having a weak stream.    It got better.   He tested my urine and it was fine.   I'm still having a weak stream, hesitant, and not empying.    Last night a couple of drops of blood came out.   This morning a few drops of blood came out.   3. EPISODES: "How many times has there been blood in the urine?" or "How many times today?"     3 times.   My lower back has been hurting for a couple of days.    4. PAIN with URINATION: "Is there any pain with passing your urine?" If so, ask: "How bad is the pain?"  (Scale 1-10; or mild, moderate, severe)    - MILD - complains slightly about urination hurting    - MODERATE - interferes with normal activities      - SEVERE - excruciating, unwilling or unable to urinate because of the pain      No burning. 5. FEVER: "Do you have a fever?" If so, ask: "What is your temperature, how was it measured, and when did it start?"     No fever.   It's checked at work   6. ASSOCIATED SYMPTOMS: "Are you passing urine more frequently than usual?"     Yes 7. OTHER SYMPTOMS: "Do you have any other symptoms?" (e.g., back/flank pain, abdominal pain, vomiting)     Lower back pain 8. PREGNANCY: "Is there any chance you are pregnant?" "When was your last menstrual period?"     N/A  Protocols used: URINE - BLOOD IN-A-AH

## 2018-09-20 NOTE — Telephone Encounter (Signed)
Please advise, since he has already had to visits about this.

## 2018-09-20 NOTE — Telephone Encounter (Signed)
Pt has been informed and expressed understanding.  

## 2018-09-20 NOTE — Addendum Note (Signed)
Addended by: Corwin Levins on: 09/20/2018 12:50 PM   Modules accepted: Orders

## 2018-09-21 ENCOUNTER — Other Ambulatory Visit (INDEPENDENT_AMBULATORY_CARE_PROVIDER_SITE_OTHER): Payer: No Typology Code available for payment source

## 2018-09-21 DIAGNOSIS — R310 Gross hematuria: Secondary | ICD-10-CM | POA: Diagnosis not present

## 2018-09-21 LAB — URINALYSIS, ROUTINE W REFLEX MICROSCOPIC
Bilirubin Urine: NEGATIVE
Hgb urine dipstick: NEGATIVE
Ketones, ur: NEGATIVE
Leukocytes,Ua: NEGATIVE
Nitrite: NEGATIVE
RBC / HPF: NONE SEEN (ref 0–?)
Specific Gravity, Urine: 1.025 (ref 1.000–1.030)
Total Protein, Urine: NEGATIVE
Urine Glucose: NEGATIVE
Urobilinogen, UA: 1 (ref 0.0–1.0)
WBC, UA: NONE SEEN (ref 0–?)
pH: 6 (ref 5.0–8.0)

## 2018-10-04 ENCOUNTER — Telehealth: Payer: Self-pay

## 2018-10-04 NOTE — Telephone Encounter (Signed)
Pt has been informed of results and expressed understanding.   Copied from CRM [id number]. Topic: General - Inquiry >> Oct 03, 2018  2:20 PM [name], [name] wrote: Reason for CRM: Patient is calling in checking in on status of urine sample and if a follow up is needed. Call back is [phone].

## 2018-10-06 NOTE — Progress Notes (Signed)
[name] [address] [place], [place] [address] Phone: [phone] Subjective:   [name], am serving as a scribe for Dr. Hulan Saas.  I'm seeing this patient by the request  of:    CC: Pain follow-up  DXA:[id number]   08/29/2018: Patient given injection and tolerated the procedure well.  Discussed icing regimen and home exercise.  We discussed nitroglycerin patches for the partial tear noted.  Patient also had calcific changes of the rotator cuff given vitamin D.  Concern for potential weakness noted and gabapentin given if there is any cervical radiculopathy that is contributing as well.  Discussed icing regimen and home exercise.  Patient will follow-up with me again in 6 weeks  Update 10/07/2018: [name] is a 48 y.o. male coming in with complaint of right shoulder pain. Patient states that he did have a decrease in pain following the injection. Feels that his rang e of motion is limited in flexion. Has been doing some pushups without pain. Finished taking gabapentin. Continues to use nitro patches.  Patient states he is approximately 53 to 90% better    Past Medical History:  Diagnosis Date  . ALLERGIC RHINITIS 12/31/2008  . DIABETES MELLITUS, TYPE II 06/03/2009  . Dyslipidemia 10/31/2014  . FOOT PAIN, RIGHT 08/08/2010  . HYPERSOMNIA 11/30/2008  . Lumbar disc disease 03/01/2011  . OBSTRUCTIVE SLEEP APNEA 02/03/2009  . OTHER INFLAMMATORY DISORDER MALE GENITAL ORGANS 11/30/2008   Past Surgical History:  Procedure Laterality Date  . HERNIA REPAIR  2012   [facility]   Social History   Socioeconomic History  . Marital status: Married    Spouse name: Not on file  . Number of children: 2  . Years of education: Not on file  . Highest education level: Not on file  Occupational History  . Occupation: Press photographer  [employer]    Employer: [employer]  Social Needs  . Financial resource strain: Not on file  . Food insecurity:    Worry:  Not on file    Inability: Not on file  . Transportation needs:    Medical: Not on file    Non-medical: Not on file  Tobacco Use  . Smoking status: Never Smoker  . Smokeless tobacco: Never Used  Substance and Sexual Activity  . Alcohol use: Yes    Comment: social  . Drug use: No  . Sexual activity: Not on file  Lifestyle  . Physical activity:    Days per week: Not on file    Minutes per session: Not on file  . Stress: Not on file  Relationships  . Social connections:    Talks on phone: Not on file    Gets together: Not on file    Attends religious service: Not on file    Active member of club or organization: Not on file    Attends meetings of clubs or organizations: Not on file    Relationship status: Not on file  Other Topics Concern  . Not on file  Social History Narrative  . Not on file   Allergies  Allergen Reactions  . Penicillins     Childhood allergy   Family History  Problem Relation Age of Onset  . Depression Mother   . Diabetes Father   . Hypertension Father   . Heart attack Father   . Coronary artery disease Father   . Depression Maternal Grandmother   . Cancer Other  facial skin cancers    Current Outpatient Medications (Endocrine & Metabolic):  Marland Kitchen  JANUVIA 100 MG tablet, TAKE 1 TABLET BY MOUTH DAILY. .  metFORMIN (GLUCOPHAGE-XR) 500 MG 24 hr tablet, Take 3 tablets (1,500 mg total) by mouth daily with breakfast.  Current Outpatient Medications (Cardiovascular):  .  nitroGLYCERIN (NITRODUR - DOSED IN MG/24 HR) 0.2 mg/hr patch, 1/4 patch daily   Current Outpatient Medications (Analgesics):  .  aspirin 81 MG tablet, Take 81 mg by mouth daily.   .  diclofenac (VOLTAREN) 75 MG EC tablet, Take 1 tablet (75 mg total) by mouth 2 (two) times daily. Marland Kitchen  ibuprofen (ADVIL,MOTRIN) 600 MG tablet, Take 1 tablet (600 mg total) by mouth every 8 (eight) hours as needed.   Current Outpatient Medications (Other):  Marland Kitchen  Accu-Chek FastClix Lancets MISC, USE TO  TEST BLOOD GLUCOSE UP TO 4 TIMES PER DAY .  ACCU-CHEK GUIDE test strip, USE TO TEST BLOOD GLUCOSE UP TO 4 TIMES PER DAY .  Alcohol Swabs (B-D SINGLE USE SWABS REGULAR) PADS, Use as directed .  blood glucose meter kit and supplies, Dispense based on patient and insurance preference. Use up to four times daily as directed. **ICD10-E11.9** .  Blood Glucose Monitoring Suppl (TRUERESULT BLOOD GLUCOSE) W/DEVICE KIT, by Does not apply route. Reported on 10/02/2015 .  doxycycline (VIBRA-TABS) 100 MG tablet, Take 1 tablet (100 mg total) by mouth 2 (two) times daily. Marland Kitchen  gabapentin (NEURONTIN) 100 MG capsule, Take 2 capsules (200 mg total) by mouth at bedtime. Elmore Guise Devices MISC, by Does not apply route. Baruch Gouty, Reported on 10/02/2015 .  Vitamin D, Ergocalciferol, (DRISDOL) 1.25 MG (50000 UT) CAPS capsule, Take 1 capsule (50,000 Units total) by mouth every 7 (seven) days.    Past medical history, social, surgical and family history all reviewed in electronic medical record.  No pertanent information unless stated regarding to the chief complaint.   Review of Systems:  No headache, visual changes, nausea, vomiting, diarrhea, constipation, dizziness, abdominal pain, skin rash, fevers, chills, night sweats, weight loss, swollen lymph nodes, body aches, joint swelling, muscle aches, chest pain, shortness of breath, mood changes.   Objective  Blood pressure 112/68, pulse 80, height 5' 11" (1.803 m), weight 213 lb (96.6 kg), SpO2 98 %.    General: No apparent distress alert and oriented x3 mood and affect normal, dressed appropriately.  HEENT: Pupils equal, extraocular movements intact  Respiratory: Patient's speak in full sentences and does not appear short of breath  Cardiovascular: No lower extremity edema, non tender, no erythema  Skin: Warm dry intact with no signs of infection or rash on extremities or on axial skeleton.  Abdomen: Soft nontender  Neuro: Cranial nerves II through XII are  intact, neurovascularly intact in all extremities with 2+ DTRs and 2+ pulses.  Lymph: No lymphadenopathy of posterior or anterior cervical chain or axillae bilaterally.  Gait normal with good balance and coordination.  MSK:  Non tender with full range of motion and good stability and symmetric strength and tone of , elbows, wrist, hip, knee and ankles bilaterally.  Right shoulder shows the patient does have improvement in range of motion but still lacks last 10 degrees of forward flexion.  Patient does have good strength but still 4+ out of 5 compared to the contralateral side.  Patient is neurovascular intact distally.  Patient does have some tenderness to palpation over the acromioclavicular joint  Limited musculoskeletal ultrasound was performed and interpreted by Alroy Dust  M Smith  Bursitis noted of the shoulder previously show significant decrease in calcific changes.  Patient does have what appears to be a very small rotator cuff tear that was not appreciated last time.  Approximately 30% of the tenderness torn.  No significant retraction noted.  Mild calcific changes in the tendon now.  Patient's acromioclavicular joint still has some swelling.    Impression and Recommendations:     This case required medical decision making of moderate complexity. The above documentation has been reviewed and is accurate and complete Joshua Pulley, DO       Note: This dictation was prepared with Dragon dictation along with smaller phrase technology. Any transcriptional errors that result from this process are unintentional.

## 2018-10-07 ENCOUNTER — Other Ambulatory Visit: Payer: Self-pay

## 2018-10-07 ENCOUNTER — Encounter: Payer: Self-pay | Admitting: Family Medicine

## 2018-10-07 ENCOUNTER — Ambulatory Visit (INDEPENDENT_AMBULATORY_CARE_PROVIDER_SITE_OTHER): Payer: No Typology Code available for payment source | Admitting: Family Medicine

## 2018-10-07 ENCOUNTER — Ambulatory Visit: Payer: Self-pay

## 2018-10-07 VITALS — BP 112/68 | HR 80 | Ht 71.0 in | Wt 213.0 lb

## 2018-10-07 DIAGNOSIS — M19011 Primary osteoarthritis, right shoulder: Secondary | ICD-10-CM | POA: Diagnosis not present

## 2018-10-07 DIAGNOSIS — M25511 Pain in right shoulder: Secondary | ICD-10-CM

## 2018-10-07 DIAGNOSIS — M7530 Calcific tendinitis of unspecified shoulder: Secondary | ICD-10-CM

## 2018-10-07 NOTE — Assessment & Plan Note (Signed)
Responded well to the injection.  We will continue to monitor.  Do believe that the calcific bursitis and now calcific tendinitis of the shoulder is likely contributing to more of the discomfort.  We discussed icing regimen.  Continue the nitroglycerin, hold on the gabapentin.  Strength is improved of his scapula but still having difficulties with a shoulder overall.  Follow-up again in 6 weeks

## 2018-10-07 NOTE — Patient Instructions (Signed)
Good to see you  Calcium is improving.  Still some work to do. Continue the nitro  Stop the gababpentin but if needed call me  See me again in 6 weeks

## 2018-10-18 MED FILL — metFORMIN HCL ER 500 MG TB2: 500 | 90 days supply | Qty: 270 | Fill #2

## 2018-10-20 MED FILL — CIPROFLOXACIN HCL 500 MG TA: 500 | 30 days supply | Qty: 60 | Fill #0

## 2018-10-20 MED FILL — TAMSULOSIN HCL 0.4 MG CAP: 0.4 | 30 days supply | Qty: 30 | Fill #0

## 2018-10-21 MED FILL — SULFAMETHOXAZOLE-TMP DS TAB: 800-160 | 30 days supply | Qty: 60 | Fill #0

## 2018-11-17 ENCOUNTER — Ambulatory Visit: Payer: No Typology Code available for payment source | Admitting: Family Medicine

## 2018-12-12 MED FILL — ACCU-CHEK FASTCLIX LANCETS: 26 days supply | Qty: 102 | Fill #0

## 2018-12-12 MED FILL — ACCU-CHEK GUIDE TEST STRIP: 25 days supply | Qty: 100 | Fill #0

## 2018-12-12 MED FILL — JANUVIA 100 MG TABLET: 100 | 90 days supply | Qty: 90 | Fill #0

## 2018-12-19 ENCOUNTER — Ambulatory Visit: Payer: No Typology Code available for payment source | Admitting: Family Medicine

## 2018-12-19 NOTE — Progress Notes (Deleted)
[name] [address] [place], [place] [address] Phone: [phone] Subjective:    I'm seeing this patient by the request  of:    CC:   [id number]  DAVONTA STROOT is a 48 y.o. male coming in with complaint of ***  Onset-  Location Duration-  Character- Aggravating factors- Reliving factors-  Therapies tried-  Severity-     Past Medical History:  Diagnosis Date   ALLERGIC RHINITIS 12/31/2008   DIABETES MELLITUS, TYPE II 06/03/2009   Dyslipidemia 10/31/2014   FOOT PAIN, RIGHT 08/08/2010   HYPERSOMNIA 11/30/2008   Lumbar disc disease 03/01/2011   OBSTRUCTIVE SLEEP APNEA 02/03/2009   OTHER INFLAMMATORY DISORDER MALE GENITAL ORGANS 11/30/2008   Past Surgical History:  Procedure Laterality Date   HERNIA REPAIR  2012   [facility]   Social History   Socioeconomic History   Marital status: Married    Spouse name: Not on file   Number of children: 2   Years of education: Not on file   Highest education level: Not on file  Occupational History   Occupation: sales  [employer]    Employer: [employer]  Social Needs   Financial resource strain: Not on file   Food insecurity    Worry: Not on file    Inability: Not on file   Transportation needs    Medical: Not on file    Non-medical: Not on file  Tobacco Use   Smoking status: Never Smoker   Smokeless tobacco: Never Used  Substance and Sexual Activity   Alcohol use: Yes    Comment: social   Drug use: No   Sexual activity: Not on file  Lifestyle   Physical activity    Days per week: Not on file    Minutes per session: Not on file   Stress: Not on file  Relationships   Social connections    Talks on phone: Not on file    Gets together: Not on file    Attends religious service: Not on file    Active member of club or organization: Not on file    Attends meetings of clubs or organizations: Not on file    Relationship status: Not on file  Other  Topics Concern   Not on file  Social History Narrative   Not on file   Allergies  Allergen Reactions   Penicillins     Childhood allergy   Family History  Problem Relation Age of Onset   Depression Mother    Diabetes Father    Hypertension Father    Heart attack Father    Coronary artery disease Father    Depression Maternal Grandmother    Cancer Other        facial skin cancers    Current Outpatient Medications (Endocrine & Metabolic):    JANUVIA 686 MG tablet, TAKE 1 TABLET BY MOUTH DAILY.   metFORMIN (GLUCOPHAGE-XR) 500 MG 24 hr tablet, Take 3 tablets (1,500 mg total) by mouth daily with breakfast.  Current Outpatient Medications (Cardiovascular):    nitroGLYCERIN (NITRODUR - DOSED IN MG/24 HR) 0.2 mg/hr patch, 1/4 patch daily   Current Outpatient Medications (Analgesics):    aspirin 81 MG tablet, Take 81 mg by mouth daily.     diclofenac (VOLTAREN) 75 MG EC tablet, Take 1 tablet (75 mg total) by mouth 2 (two) times daily.   ibuprofen (ADVIL,MOTRIN) 600 MG tablet, Take 1 tablet (600 mg total) by mouth every 8 (  eight) hours as needed.   Current Outpatient Medications (Other):    Accu-Chek FastClix Lancets MISC, USE TO TEST BLOOD GLUCOSE UP TO 4 TIMES PER DAY   ACCU-CHEK GUIDE test strip, USE TO TEST BLOOD GLUCOSE UP TO 4 TIMES PER DAY   Alcohol Swabs (B-D SINGLE USE SWABS REGULAR) PADS, Use as directed   blood glucose meter kit and supplies, Dispense based on patient and insurance preference. Use up to four times daily as directed. **ICD10-E11.9**   Blood Glucose Monitoring Suppl (TRUERESULT BLOOD GLUCOSE) W/DEVICE KIT, by Does not apply route. Reported on 10/02/2015   doxycycline (VIBRA-TABS) 100 MG tablet, Take 1 tablet (100 mg total) by mouth 2 (two) times daily.   gabapentin (NEURONTIN) 100 MG capsule, Take 2 capsules (200 mg total) by mouth at bedtime.   Lancet Devices MISC, by Does not apply route.   NON FORMULARY, Reported on 10/02/2015    Vitamin D, Ergocalciferol, (DRISDOL) 1.25 MG (50000 UT) CAPS capsule, Take 1 capsule (50,000 Units total) by mouth every 7 (seven) days.    Past medical history, social, surgical and family history all reviewed in electronic medical record.  No pertanent information unless stated regarding to the chief complaint.   Review of Systems:  No headache, visual changes, nausea, vomiting, diarrhea, constipation, dizziness, abdominal pain, skin rash, fevers, chills, night sweats, weight loss, swollen lymph nodes, body aches, joint swelling, muscle aches, chest pain, shortness of breath, mood changes.   Objective  There were no vitals taken for this visit. Systems examined below as of    General: No apparent distress alert and oriented x3 mood and affect normal, dressed appropriately.  HEENT: Pupils equal, extraocular movements intact  Respiratory: Patient's speak in full sentences and does not appear short of breath  Cardiovascular: No lower extremity edema, non tender, no erythema  Skin: Warm dry intact with no signs of infection or rash on extremities or on axial skeleton.  Abdomen: Soft nontender  Neuro: Cranial nerves II through XII are intact, neurovascularly intact in all extremities with 2+ DTRs and 2+ pulses.  Lymph: No lymphadenopathy of posterior or anterior cervical chain or axillae bilaterally.  Gait normal with good balance and coordination.  MSK:  Non tender with full range of motion and good stability and symmetric strength and tone of shoulders, elbows, wrist, hip, knee and ankles bilaterally.     Impression and Recommendations:     This case required medical decision making of moderate complexity. The above documentation has been reviewed and is accurate and complete Lyndal Pulley, DO       Note: This dictation was prepared with Dragon dictation along with smaller phrase technology. Any transcriptional errors that result from this process are unintentional.

## 2019-01-17 MED FILL — metFORMIN HCL ER 500 MG TB2: 500 | 90 days supply | Qty: 270 | Fill #3

## 2019-01-18 ENCOUNTER — Ambulatory Visit: Payer: Self-pay

## 2019-01-18 ENCOUNTER — Ambulatory Visit: Payer: No Typology Code available for payment source | Admitting: Family Medicine

## 2019-01-18 ENCOUNTER — Other Ambulatory Visit: Payer: Self-pay

## 2019-01-18 VITALS — BP 108/76 | HR 98 | Ht 71.0 in | Wt 215.0 lb

## 2019-01-18 DIAGNOSIS — G8929 Other chronic pain: Secondary | ICD-10-CM

## 2019-01-18 DIAGNOSIS — M19011 Primary osteoarthritis, right shoulder: Secondary | ICD-10-CM

## 2019-01-18 DIAGNOSIS — M7530 Calcific tendinitis of unspecified shoulder: Secondary | ICD-10-CM

## 2019-01-18 DIAGNOSIS — M25511 Pain in right shoulder: Secondary | ICD-10-CM | POA: Diagnosis not present

## 2019-01-18 NOTE — Progress Notes (Signed)
[name] [address] [place], [place] [address] Phone: [phone] Subjective:   Joshua [name], am serving as a scribe for Dr. Hulan Saas.   CC: Shoulder pain follow-up  RDE:[id number]   10/07/2018 Responded well to the injection.  We will continue to monitor.  Do believe that the calcific bursitis and now calcific tendinitis of the shoulder is likely contributing to more of the discomfort.  We discussed icing regimen.  Continue the nitroglycerin, hold on the gabapentin.  Strength is improved of his scapula but still having difficulties with a shoulder overall.  Follow-up again in 6 weeks  Update 01/18/2019 Joshua [name] is a 48 y.o. male coming in with complaint of right shoulder pain. Does feel like he isn't using the shoulder as much and is doing much better. Did use nitro patches but has discontinued use.  Patient states that he is feeling 90% better overall though.     Patient previously did have a 30% tear of the rotator cuff as well as acromioclavicular arthritis.  In 3 months this was seen patient.  Past Medical History:  Diagnosis Date  . ALLERGIC RHINITIS 12/31/2008  . DIABETES MELLITUS, TYPE II 06/03/2009  . Dyslipidemia 10/31/2014  . FOOT PAIN, RIGHT 08/08/2010  . HYPERSOMNIA 11/30/2008  . Lumbar disc disease 03/01/2011  . OBSTRUCTIVE SLEEP APNEA 02/03/2009  . OTHER INFLAMMATORY DISORDER MALE GENITAL ORGANS 11/30/2008   Past Surgical History:  Procedure Laterality Date  . HERNIA REPAIR  2012   [facility]   Social History   Socioeconomic History  . Marital status: Married    Spouse name: Not on file  . Number of children: 2  . Years of education: Not on file  . Highest education level: Not on file  Occupational History  . Occupation: Press photographer  [employer]    Employer: [employer]  Social Needs  . Financial resource strain: Not on file  . Food insecurity    Worry: Not on file    Inability: Not on file  . Transportation  needs    Medical: Not on file    Non-medical: Not on file  Tobacco Use  . Smoking status: Never Smoker  . Smokeless tobacco: Never Used  Substance and Sexual Activity  . Alcohol use: Yes    Comment: social  . Drug use: [name]  . Sexual activity: Not on file  Lifestyle  . Physical activity    Days per week: Not on file    Minutes per session: Not on file  . Stress: Not on file  Relationships  . Social Herbalist on phone: Not on file    Gets together: Not on file    Attends religious service: Not on file    Active member of club or organization: Not on file    Attends meetings of clubs or organizations: Not on file    Relationship status: Not on file  Other Topics Concern  . Not on file  Social History Narrative  . Not on file   Allergies  Allergen Reactions  . Penicillins     Childhood allergy   Family History  Problem Relation Age of Onset  . Depression Mother   . Diabetes Father   . Hypertension Father   . Heart attack Father   . Coronary artery disease Father   . Depression Maternal Grandmother   . Cancer Other        facial skin cancers  Current Outpatient Medications (Endocrine & Metabolic):  Marland Kitchen  JANUVIA 100 MG tablet, TAKE 1 TABLET BY MOUTH DAILY. .  metFORMIN (GLUCOPHAGE-XR) 500 MG 24 hr tablet, Take 3 tablets (1,500 mg total) by mouth daily with breakfast.  Current Outpatient Medications (Cardiovascular):  .  nitroGLYCERIN (NITRODUR - DOSED IN MG/24 HR) 0.2 mg/hr patch, 1/4 patch daily   Current Outpatient Medications (Analgesics):  .  aspirin 81 MG tablet, Take 81 mg by mouth daily.   .  diclofenac (VOLTAREN) 75 MG EC tablet, Take 1 tablet (75 mg total) by mouth 2 (two) times daily. Marland Kitchen  ibuprofen (ADVIL,MOTRIN) 600 MG tablet, Take 1 tablet (600 mg total) by mouth every 8 (eight) hours as needed.   Current Outpatient Medications (Other):  Marland Kitchen  Accu-Chek FastClix Lancets MISC, USE TO TEST BLOOD GLUCOSE UP TO 4 TIMES PER DAY .  ACCU-CHEK GUIDE  test strip, USE TO TEST BLOOD GLUCOSE UP TO 4 TIMES PER DAY .  Alcohol Swabs (B-D SINGLE USE SWABS REGULAR) PADS, Use as directed .  blood glucose meter kit and supplies, Dispense based on patient and insurance preference. Use up to four times daily as directed. **ICD10-E11.9** .  Blood Glucose Monitoring Suppl (TRUERESULT BLOOD GLUCOSE) W/DEVICE KIT, by Does not apply route. Reported on 10/02/2015 .  doxycycline (VIBRA-TABS) 100 MG tablet, Take 1 tablet (100 mg total) by mouth 2 (two) times daily. Marland Kitchen  gabapentin (NEURONTIN) 100 MG capsule, Take 2 capsules (200 mg total) by mouth at bedtime. Elmore Guise Devices MISC, by Does not apply route. Baruch Gouty, Reported on 10/02/2015 .  Vitamin D, Ergocalciferol, (DRISDOL) 1.25 MG (50000 UT) CAPS capsule, Take 1 capsule (50,000 Units total) by mouth every 7 (seven) days.    Past medical history, social, surgical and family history all reviewed in electronic medical record.  [name] pertanent information unless stated regarding to the chief complaint.   Review of Systems:  [name] headache, visual changes, nausea, vomiting, diarrhea, constipation, dizziness, abdominal pain, skin rash, fevers, chills, night sweats, weight loss, swollen lymph nodes, body aches, joint swelling, muscle aches, chest pain, shortness of breath, mood changes.   Objective  Blood pressure 108/76, pulse 98, height _0  (1.803 m), weight 215 lb (97.5 kg), SpO2 99 %.   General: [name] apparent distress alert and oriented x3 mood and affect normal, dressed appropriately.  HEENT: Pupils equal, extraocular movements intact  Respiratory: Patient's speak in full sentences and does not appear short of breath  Cardiovascular: [name] lower extremity edema, non tender, [name] erythema  Skin: Warm dry intact with [name] signs of infection or rash on extremities or on axial skeleton.  Abdomen: Soft nontender  Neuro: Cranial nerves II through XII are intact, neurovascularly intact in all extremities with 2+ DTRs  and 2+ pulses.  Lymph: [name] lymphadenopathy of posterior or anterior cervical chain or axillae bilaterally.  Gait normal with good balance and coordination.  MSK:  tender with full range of motion and good stability and symmetric strength and tone of  elbows, wrist, hip, knee and ankles bilaterally.  Right shoulder exam still shows some mild positive impingement but insignificant improvement from previous exam.  Patient has 4+ out of 5 strength of rotator cuff in the scapular compared to the contralateral side.  Neurovascular intact with 5 out of 5 grip strength   Limited musculoskeletal ultrasound was performed and interpreted by Lyndal Pulley  Limited ultrasound shows the patient still has a small effusion of the acromioclavicular joint, significant improvement  in the calcific bursitis of the shoulder and patient's previous partial tear of the rotator cuff has improved as well.  [name] significant retraction at any point of the supraspinatus. Impression: Interval healing with continued synovitis of the acromioclavicular joint   Impression and Recommendations:     This case required medical decision making of moderate complexity. The above documentation has been reviewed and is accurate and complete Lyndal Pulley, DO       Note: This dictation was prepared with Dragon dictation along with smaller phrase technology. Any transcriptional errors that result from this process are unintentional.

## 2019-01-18 NOTE — Assessment & Plan Note (Signed)
Continues to have some mild inflammation.  Injection 4 months ago.  90% better at this time.  Discussed regular home exercise, patient will follow-up with me again as needed

## 2019-01-18 NOTE — Assessment & Plan Note (Signed)
Significant improvement at this time. No need to continue the nitroglycerin patches.  Follow-up as needed

## 2019-01-18 NOTE — Patient Instructions (Signed)
RTC looks good-Ok to push it [phone] if you need an [facility] injection Call us otherwise when you need [place]

## 2019-03-09 ENCOUNTER — Other Ambulatory Visit: Payer: Self-pay | Admitting: Internal Medicine

## 2019-03-09 MED FILL — JANUVIA 100 MG TABLET: 100 | 90 days supply | Qty: 90 | Fill #0

## 2019-04-11 ENCOUNTER — Other Ambulatory Visit: Payer: Self-pay | Admitting: Internal Medicine

## 2019-04-11 MED FILL — ACCU-CHEK GUIDE TEST STRIP: 25 days supply | Qty: 100 | Fill #1

## 2019-04-11 MED FILL — METFORMIN HCL ER 500 MG TB2: 500 | 90 days supply | Qty: 270 | Fill #0

## 2019-04-11 MED FILL — ACCU-CHEK FASTCLIX LANCETS: 26 days supply | Qty: 102 | Fill #1

## 2019-04-12 ENCOUNTER — Other Ambulatory Visit: Payer: Self-pay

## 2019-04-12 DIAGNOSIS — Z20822 Contact with and (suspected) exposure to covid-19: Secondary | ICD-10-CM

## 2019-04-14 LAB — NOVEL CORONAVIRUS, NAA: SARS-CoV-2, NAA: NOT DETECTED

## 2019-05-03 ENCOUNTER — Telehealth: Payer: Self-pay | Admitting: Internal Medicine

## 2019-05-03 DIAGNOSIS — E119 Type 2 diabetes mellitus without complications: Secondary | ICD-10-CM

## 2019-05-03 NOTE — Telephone Encounter (Signed)
Pt called and wanted Dr to know, doing well and BS reading are from 150 to 160 at nite after eating and does not have a FU sch and would be fine with not having one, however if he feels it necessary with those readings  he would be ok with lab work does not necessarily need appt. As covid concern. Just advise if needs to come in April last visit

## 2019-05-04 NOTE — Telephone Encounter (Signed)
[place] for OV, labs have been ordered to have done prior if he likes

## 2019-05-04 NOTE — Addendum Note (Signed)
Addended by: Biagio Borg on: 05/04/2019 01:05 PM   Modules accepted: Orders

## 2019-05-09 ENCOUNTER — Other Ambulatory Visit (INDEPENDENT_AMBULATORY_CARE_PROVIDER_SITE_OTHER): Payer: No Typology Code available for payment source

## 2019-05-09 DIAGNOSIS — E119 Type 2 diabetes mellitus without complications: Secondary | ICD-10-CM

## 2019-05-09 LAB — HEPATIC FUNCTION PANEL
ALT: 20 U/L (ref 0–53)
AST: 17 U/L (ref 0–37)
Albumin: 4.5 g/dL (ref 3.5–5.2)
Alkaline Phosphatase: 57 U/L (ref 39–117)
Bilirubin, Direct: 0.1 mg/dL (ref 0.0–0.3)
Total Bilirubin: 0.6 mg/dL (ref 0.2–1.2)
Total Protein: 7 g/dL (ref 6.0–8.3)

## 2019-05-09 LAB — LIPID PANEL
Cholesterol: 127 mg/dL (ref 0–200)
HDL: 38.8 mg/dL — ABNORMAL LOW (ref >39.00)
LDL Cholesterol: 58 mg/dL (ref 0–99)
NonHDL: 88.12
Total CHOL/HDL Ratio: 3
Triglycerides: 151 mg/dL — ABNORMAL HIGH (ref 0.0–149.0)
VLDL: 30.2 mg/dL (ref 0.0–40.0)

## 2019-05-09 LAB — HM DIABETES EYE EXAM

## 2019-05-09 LAB — HEMOGLOBIN A1C: Hgb A1c MFr Bld: 6.9 % — ABNORMAL HIGH (ref 4.6–6.5)

## 2019-05-09 LAB — BASIC METABOLIC PANEL
BUN: 18 mg/dL (ref 6–23)
CO2: 28 mEq/L (ref 19–32)
Calcium: 9.5 mg/dL (ref 8.4–10.5)
Chloride: 102 mEq/L (ref 96–112)
Creatinine, Ser: 1.23 mg/dL (ref 0.40–1.50)
GFR: 62.58 mL/min (ref >60.00)
Glucose, Bld: 123 mg/dL — ABNORMAL HIGH (ref 70–99)
Potassium: 4.2 mEq/L (ref 3.5–5.1)
Sodium: 138 mEq/L (ref 135–145)

## 2019-05-24 MED FILL — ACCU-CHEK FASTCLIX LANCETS: 26 days supply | Qty: 102 | Fill #2

## 2019-05-24 MED FILL — ACCU-CHEK GUIDE TEST STRIP: 25 days supply | Qty: 100 | Fill #2

## 2019-06-12 ENCOUNTER — Other Ambulatory Visit: Payer: Self-pay | Admitting: Internal Medicine

## 2019-06-12 NOTE — Telephone Encounter (Signed)
Please refill as per office routine med refill policy (all routine meds refilled for 3 mo or monthly per pt preference up to one year from last visit, then month to month grace period for 3 mo, then further med refills will have to be denied)  

## 2019-06-15 MED FILL — JANUVIA 100 MG TABLET: 100 | 60 days supply | Qty: 60 | Fill #0

## 2019-07-12 MED FILL — METFORMIN HCL ER 500 MG TB2: 500 | 90 days supply | Qty: 270 | Fill #1

## 2019-08-14 MED FILL — JANUVIA 100 MG TABLET: 100 | 30 days supply | Qty: 30 | Fill #1

## 2019-08-17 ENCOUNTER — Other Ambulatory Visit: Payer: Self-pay | Admitting: Internal Medicine

## 2019-08-22 ENCOUNTER — Other Ambulatory Visit: Payer: Self-pay | Admitting: Internal Medicine

## 2019-08-22 ENCOUNTER — Encounter: Payer: Self-pay | Admitting: Internal Medicine

## 2019-08-22 ENCOUNTER — Other Ambulatory Visit: Payer: Self-pay

## 2019-08-22 ENCOUNTER — Ambulatory Visit (INDEPENDENT_AMBULATORY_CARE_PROVIDER_SITE_OTHER): Payer: No Typology Code available for payment source | Admitting: Internal Medicine

## 2019-08-22 VITALS — BP 146/96 | HR 74 | Temp 98.2°F | Ht 71.0 in | Wt 215.0 lb

## 2019-08-22 DIAGNOSIS — E559 Vitamin D deficiency, unspecified: Secondary | ICD-10-CM

## 2019-08-22 DIAGNOSIS — E119 Type 2 diabetes mellitus without complications: Secondary | ICD-10-CM

## 2019-08-22 DIAGNOSIS — E611 Iron deficiency: Secondary | ICD-10-CM | POA: Diagnosis not present

## 2019-08-22 DIAGNOSIS — Z0000 Encounter for general adult medical examination without abnormal findings: Secondary | ICD-10-CM | POA: Diagnosis not present

## 2019-08-22 DIAGNOSIS — Z0001 Encounter for general adult medical examination with abnormal findings: Secondary | ICD-10-CM

## 2019-08-22 DIAGNOSIS — E785 Hyperlipidemia, unspecified: Secondary | ICD-10-CM

## 2019-08-22 DIAGNOSIS — G2581 Restless legs syndrome: Secondary | ICD-10-CM | POA: Insufficient documentation

## 2019-08-22 DIAGNOSIS — E538 Deficiency of other specified B group vitamins: Secondary | ICD-10-CM

## 2019-08-22 LAB — URINALYSIS, ROUTINE W REFLEX MICROSCOPIC
Bilirubin Urine: NEGATIVE
Hgb urine dipstick: NEGATIVE
Ketones, ur: NEGATIVE
Leukocytes,Ua: NEGATIVE
Nitrite: NEGATIVE
RBC / HPF: NONE SEEN (ref 0–?)
Specific Gravity, Urine: >=1.03 — AB (ref 1.000–1.030)
Total Protein, Urine: NEGATIVE
Urine Glucose: NEGATIVE
Urobilinogen, UA: 0.2 (ref 0.0–1.0)
pH: 5.5 (ref 5.0–8.0)

## 2019-08-22 LAB — IBC PANEL
Iron: 104 ug/dL (ref 42–165)
Saturation Ratios: 28.6 % (ref 20.0–50.0)
Transferrin: 260 mg/dL (ref 212.0–360.0)

## 2019-08-22 LAB — CBC WITH DIFFERENTIAL/PLATELET
Basophils Absolute: 0 10*3/uL (ref 0.0–0.1)
Basophils Relative: 0.4 % (ref 0.0–3.0)
Eosinophils Absolute: 0.2 10*3/uL (ref 0.0–0.7)
Eosinophils Relative: 1.8 % (ref 0.0–5.0)
HCT: 48.4 % (ref 39.0–52.0)
Hemoglobin: 16.5 g/dL (ref 13.0–17.0)
Lymphocytes Relative: 26.5 % (ref 12.0–46.0)
Lymphs Abs: 2.4 10*3/uL (ref 0.7–4.0)
MCHC: 34.1 g/dL (ref 30.0–36.0)
MCV: 90.6 fl (ref 78.0–100.0)
Monocytes Absolute: 0.8 10*3/uL (ref 0.1–1.0)
Monocytes Relative: 9.1 % (ref 3.0–12.0)
Neutro Abs: 5.5 10*3/uL (ref 1.4–7.7)
Neutrophils Relative %: 62.2 % (ref 43.0–77.0)
Platelets: 199 10*3/uL (ref 150.0–400.0)
RBC: 5.34 Mil/uL (ref 4.22–5.81)
RDW: 13.9 % (ref 11.5–15.5)
WBC: 8.9 10*3/uL (ref 4.0–10.5)

## 2019-08-22 LAB — HEPATIC FUNCTION PANEL
ALT: 22 U/L (ref 0–53)
AST: 16 U/L (ref 0–37)
Albumin: 4.4 g/dL (ref 3.5–5.2)
Alkaline Phosphatase: 70 U/L (ref 39–117)
Bilirubin, Direct: 0.1 mg/dL (ref 0.0–0.3)
Total Bilirubin: 0.6 mg/dL (ref 0.2–1.2)
Total Protein: 6.6 g/dL (ref 6.0–8.3)

## 2019-08-22 LAB — LIPID PANEL
Cholesterol: 124 mg/dL (ref 0–200)
HDL: 38.6 mg/dL — ABNORMAL LOW (ref >39.00)
LDL Cholesterol: 67 mg/dL (ref 0–99)
NonHDL: 84.99
Total CHOL/HDL Ratio: 3
Triglycerides: 88 mg/dL (ref 0.0–149.0)
VLDL: 17.6 mg/dL (ref 0.0–40.0)

## 2019-08-22 LAB — PSA: PSA: 0.55 ng/mL (ref 0.10–4.00)

## 2019-08-22 LAB — BASIC METABOLIC PANEL
BUN: 16 mg/dL (ref 6–23)
CO2: 29 mEq/L (ref 19–32)
Calcium: 9.3 mg/dL (ref 8.4–10.5)
Chloride: 104 mEq/L (ref 96–112)
Creatinine, Ser: 1.11 mg/dL (ref 0.40–1.50)
GFR: 70.37 mL/min (ref >60.00)
Glucose, Bld: 159 mg/dL — ABNORMAL HIGH (ref 70–99)
Potassium: 4.5 mEq/L (ref 3.5–5.1)
Sodium: 138 mEq/L (ref 135–145)

## 2019-08-22 LAB — TSH: TSH: 2.58 u[IU]/mL (ref 0.35–4.50)

## 2019-08-22 LAB — HEMOGLOBIN A1C: Hgb A1c MFr Bld: 7.3 % — ABNORMAL HIGH (ref 4.6–6.5)

## 2019-08-22 LAB — VITAMIN B12: Vitamin B-12: 162 pg/mL — ABNORMAL LOW (ref 211–911)

## 2019-08-22 LAB — MICROALBUMIN / CREATININE URINE RATIO
Creatinine,U: 138.3 mg/dL
Microalb Creat Ratio: 0.5 mg/g (ref 0.0–30.0)
Microalb, Ur: <0.7 mg/dL (ref 0.0–1.9)

## 2019-08-22 LAB — VITAMIN D 25 HYDROXY (VIT D DEFICIENCY, FRACTURES): VITD: 32.5 ng/mL (ref 30.00–100.00)

## 2019-08-22 MED ORDER — DICLOFENAC SODIUM 75 MG PO TBEC: 75.0000 mg | DELAYED_RELEASE_TABLET | Freq: Two times a day (BID) | ORAL | 5 refills | Status: AC | PRN

## 2019-08-22 MED ORDER — GABAPENTIN 300 MG PO CAPS: ORAL_CAPSULE | ORAL | 3 refills | Status: AC

## 2019-08-22 MED FILL — DICLOFENAC SOD EC 75 MG TAB: 75 | 30 days supply | Qty: 60 | Fill #0

## 2019-08-22 MED FILL — GABAPENTIN 300 MG CAPSULE: 300 | 45 days supply | Qty: 90 | Fill #0

## 2019-08-22 NOTE — Assessment & Plan Note (Signed)
stable overall by history and exam, recent data reviewed with pt, and pt to continue medical treatment as before,  to f/u any worsening symptoms or concerns  

## 2019-08-22 NOTE — Assessment & Plan Note (Addendum)
For gabapentin 300 - 600 qhs prn, also voltaren qhs prn  I spent 30 minutes in addition to time for CPX wellness examination in preparing to see the patient by review of recent labs, imaging and procedures, obtaining and reviewing separately obtained history, communicating with the patient and family or caregiver, ordering medications, tests or procedures, and documenting clinical information in the EHR including the differential Dx, treatment, and any further evaluation and other management of rlS and leg pain, dm, HLD

## 2019-08-22 NOTE — Patient Instructions (Signed)
Please take all new medication as prescribed - the gabapentin and voltaren for pain and restless at night  Please continue all other medications as before, and refills have been done if requested.  Please have the pharmacy call with any other refills you may need.  Please continue your efforts at being more active, low cholesterol diet, and weight control.  You are otherwise up to date with prevention measures today.  Please keep your appointments with your specialists as you may have planned  Please go to the LAB at the blood drawing area for the tests to be done  You will be contacted by phone if any changes need to be made immediately.  Otherwise, you will receive a letter about your results with an explanation, but please check with MyChart first.  Please remember to sign up for MyChart if you have not done so, as this will be important to you in the future with finding out test results, communicating by private email, and scheduling acute appointments online when needed.  Please make an Appointment to return in 6 months, or sooner if needed, also with Lab Appointment for testing done 3-5 days before at the FIRST FLOOR Lab (so this is for TWO appointments - please see the scheduling desk as you leave)

## 2019-08-22 NOTE — Telephone Encounter (Signed)
Please refill as per office routine med refill policy (all routine meds refilled for 3 mo or monthly per pt preference up to one year from last visit, then month to month grace period for 3 mo, then further med refills will have to be denied)  

## 2019-08-22 NOTE — Progress Notes (Signed)
Subjective:    Patient ID: [name], male    DOB: 12-Mar-1971, 49 y.o.   MRN: [id number]  HPI  Here for wellness and f/u;  Overall doing ok;  Pt denies Chest pain, worsening SOB, DOE, wheezing, orthopnea, PND, worsening LE edema, palpitations, dizziness or syncope.  Pt denies neurological change such as new headache, facial or extremity weakness.  Pt denies polydipsia, polyuria, or low sugar symptoms. Pt states overall good compliance with treatment and medications, good tolerability, and has been trying to follow appropriate diet.  Pt denies worsening depressive symptoms, suicidal ideation or panic. No fever, night sweats, wt loss, loss of appetite, or other constitutional symptoms.  Pt states good ability with ADL's, has low fall risk, home safety reviewed and adequate, no other significant changes in hearing or vision, and only occasionally active with exercise.  Also c/o 3-4 wks with ?RLS like symptoms at night, also leg not cramping at night but sore and aches and keeps up some times at night; stands and walks all dayover 10K per day; also "walking on the bones of my feet" mostly during the day.   Past Medical History:  Diagnosis Date  . ALLERGIC RHINITIS 12/31/2008  . DIABETES MELLITUS, TYPE II 06/03/2009  . Dyslipidemia 10/31/2014  . FOOT PAIN, RIGHT 08/08/2010  . HYPERSOMNIA 11/30/2008  . Lumbar disc disease 03/01/2011  . OBSTRUCTIVE SLEEP APNEA 02/03/2009  . OTHER INFLAMMATORY DISORDER MALE GENITAL ORGANS 11/30/2008   Past Surgical History:  Procedure Laterality Date  . HERNIA REPAIR  2012   [facility]    reports that he has never smoked. He has never used smokeless tobacco. He reports current alcohol use. He reports that he does not use drugs. family history includes Cancer in an other family member; Coronary artery disease in his father; Depression in his maternal grandmother and mother; Diabetes in his father; Heart attack in his father; Hypertension in his father. Allergies  Allergen  Reactions  . Penicillins     Childhood allergy   Current Outpatient Medications on File Prior to Visit  Medication Sig Dispense Refill  . Accu-Chek FastClix Lancets MISC USE TO TEST BLOOD GLUCOSE UP TO 4 TIMES PER DAY 102 each 3  . ACCU-CHEK GUIDE test strip USE TO TEST BLOOD GLUCOSE UP TO 4 TIMES PER DAY 100 each 3  . Alcohol Swabs (B-D SINGLE USE SWABS REGULAR) PADS Use as directed    . blood glucose meter kit and supplies Dispense based on patient and insurance preference. Use up to four times daily as directed. **ICD10-E11.9** 1 each 0  . Blood Glucose Monitoring Suppl (TRUERESULT BLOOD GLUCOSE) W/DEVICE KIT by Does not apply route. Reported on 10/02/2015    . JANUVIA 100 MG tablet TAKE 1 TABLET BY MOUTH DAILY. 90 tablet 0  . Lancet Devices MISC by Does not apply route.    . metFORMIN (GLUCOPHAGE-XR) 500 MG 24 hr tablet TAKE 3 TABLETS (1,500 MG TOTAL) BY MOUTH DAILY WITH BREAKFAST. 270 tablet 1  . nitroGLYCERIN (NITRODUR - DOSED IN MG/24 HR) 0.2 mg/hr patch 1/4 patch daily 30 patch 1  . aspirin 81 MG tablet Take 81 mg by mouth daily.      . NON FORMULARY Reported on 10/02/2015     No current facility-administered medications on file prior to visit.   Review of Systems All otherwise neg per pt    Objective:   Physical Exam BP (!) 146/96   Pulse 74   Temp 98.2 F (36.8 C)  Ht _0  (1.803 m)   Wt 215 lb (97.5 kg)   SpO2 99%   BMI 29.99 kg/m  VS noted,  Constitutional: Pt appears in NAD HENT: Head: NCAT.  Right Ear: External ear normal.  Left Ear: External ear normal.  Eyes: . Pupils are equal, round, and reactive to light. Conjunctivae and EOM are normal Nose: without d/c or deformity Neck: Neck supple. Gross normal ROM Cardiovascular: Normal rate and regular rhythm.   Pulmonary/Chest: Effort normal and breath sounds without rales or wheezing.  Abd:  Soft, NT, ND, + BS, no organomegaly Neurological: Pt is alert. At baseline orientation, motor grossly intact Skin: Skin  is warm. No rashes, other new lesions, no LE edema Psychiatric: Pt behavior is normal without agitation  All otherwise neg per pt Lab Results  Component Value Date   WBC 8.9 08/22/2019   HGB 16.5 08/22/2019   HCT 48.4 08/22/2019   PLT 199.0 08/22/2019   GLUCOSE 159 (H) 08/22/2019   CHOL 124 08/22/2019   TRIG 88.0 08/22/2019   HDL 38.60 (L) 08/22/2019   LDLDIRECT 70.0 03/05/2017   LDLCALC 67 08/22/2019   ALT 22 08/22/2019   AST 16 08/22/2019   NA 138 08/22/2019   K 4.5 08/22/2019   CL 104 08/22/2019   CREATININE 1.11 08/22/2019   BUN 16 08/22/2019   CO2 29 08/22/2019   TSH 2.58 08/22/2019   PSA 0.55 08/22/2019   HGBA1C 7.3 (H) 08/22/2019   MICROALBUR <0.7 08/22/2019      Assessment & Plan:

## 2019-08-22 NOTE — Assessment & Plan Note (Signed)

## 2019-08-23 ENCOUNTER — Other Ambulatory Visit: Payer: Self-pay | Admitting: Internal Medicine

## 2019-08-24 ENCOUNTER — Other Ambulatory Visit: Payer: Self-pay | Admitting: Internal Medicine

## 2019-08-24 MED ORDER — VITAMIN B-12 1000 MCG PO TABS: 1000.0000 ug | ORAL_TABLET | Freq: Every day | ORAL | 3 refills | Status: DC

## 2019-08-24 MED ORDER — METFORMIN HCL ER 500 MG PO TB24: 2000.0000 mg | ORAL_TABLET | Freq: Every day | ORAL | 3 refills | Status: DC

## 2019-09-12 ENCOUNTER — Other Ambulatory Visit: Payer: Self-pay

## 2019-09-12 ENCOUNTER — Other Ambulatory Visit: Payer: Self-pay | Admitting: Internal Medicine

## 2019-09-12 MED FILL — JANUVIA 100 MG TABLET: 100 | 30 days supply | Qty: 30 | Fill #0

## 2019-09-12 NOTE — Telephone Encounter (Signed)
Please refill as per office routine med refill policy (all routine meds refilled for 3 mo or monthly per pt preference up to one year from last visit, then month to month grace period for 3 mo, then further med refills will have to be denied)  

## 2019-10-02 ENCOUNTER — Telehealth: Payer: Self-pay | Admitting: Internal Medicine

## 2019-10-02 ENCOUNTER — Other Ambulatory Visit: Payer: Self-pay | Admitting: Internal Medicine

## 2019-10-02 MED ORDER — LANCET DEVICE MISC: 11 refills | Status: AC

## 2019-10-02 MED ORDER — ONETOUCH VERIO VI STRP: ORAL_STRIP | 12 refills | Status: DC

## 2019-10-02 MED ORDER — ONETOUCH VERIO W/DEVICE KIT: 1.0000 | PACK | Freq: Two times a day (BID) | 0 refills | Status: DC

## 2019-10-02 NOTE — Telephone Encounter (Signed)
Done erx 

## 2019-10-02 NOTE — Telephone Encounter (Signed)
New message:    Pt is calling and states he needs a new prescription for a new glucose meter due to his other one breaking. He would like the prescription sent to [facility] [place], [place] - [address] [facility] Please advise.

## 2019-10-03 ENCOUNTER — Other Ambulatory Visit: Payer: Self-pay | Admitting: Internal Medicine

## 2019-10-03 ENCOUNTER — Telehealth: Payer: Self-pay | Admitting: Internal Medicine

## 2019-10-03 MED ORDER — FREESTYLE UNISTICK II LANCETS MISC: 1 refills | Status: DC

## 2019-10-03 MED ORDER — METFORMIN HCL ER 500 MG PO TB24: 2000.0000 mg | ORAL_TABLET | Freq: Every day | ORAL | 3 refills | Status: DC

## 2019-10-03 MED ORDER — FREESTYLE FREEDOM LITE W/DEVICE KIT: PACK | 0 refills | Status: AC

## 2019-10-03 MED ORDER — FREESTYLE LITE TEST VI STRP: ORAL_STRIP | 3 refills | Status: DC

## 2019-10-03 NOTE — Telephone Encounter (Signed)
[facility] rx's to [facility]Marland KitchenRaechel Chute

## 2019-10-03 NOTE — Telephone Encounter (Signed)
New message:   Rosann Auerbach is calling from [facility] and states they pt can only get the Freestyle Light Meter since insurance has changed. She states to please send in a request for this meter as well for strips and lancets.    She also states they are needing a new prescription for the pt's Metformin. The pt states he is taking 4 a day and the prescription says to take 3 a day. Please advise.

## 2019-11-14 MED FILL — JANUVIA 100 MG TABLET: 100 | 30 days supply | Qty: 30 | Fill #2

## 2019-12-13 MED FILL — JANUVIA 100 MG TABLET: 100 | 30 days supply | Qty: 30 | Fill #3

## 2020-01-11 MED FILL — JANUVIA 100 MG TABLET: 100 | 30 days supply | Qty: 30 | Fill #4

## 2020-01-30 MED FILL — METFORMIN HCL ER 500 MG TB2: 500 | 90 days supply | Qty: 360 | Fill #1

## 2020-02-19 MED FILL — JANUVIA 100 MG TABLET: 100 | 30 days supply | Qty: 30 | Fill #5

## 2020-02-27 ENCOUNTER — Encounter: Payer: Self-pay | Admitting: Internal Medicine

## 2020-02-27 ENCOUNTER — Ambulatory Visit (INDEPENDENT_AMBULATORY_CARE_PROVIDER_SITE_OTHER): Payer: No Typology Code available for payment source | Admitting: Internal Medicine

## 2020-02-27 ENCOUNTER — Other Ambulatory Visit: Payer: Self-pay

## 2020-02-27 VITALS — BP 120/82 | HR 75 | Temp 98.2°F | Ht 71.0 in | Wt 205.0 lb

## 2020-02-27 DIAGNOSIS — J309 Allergic rhinitis, unspecified: Secondary | ICD-10-CM

## 2020-02-27 DIAGNOSIS — Z0000 Encounter for general adult medical examination without abnormal findings: Secondary | ICD-10-CM

## 2020-02-27 DIAGNOSIS — E785 Hyperlipidemia, unspecified: Secondary | ICD-10-CM | POA: Diagnosis not present

## 2020-02-27 DIAGNOSIS — Z23 Encounter for immunization: Secondary | ICD-10-CM | POA: Diagnosis not present

## 2020-02-27 DIAGNOSIS — E538 Deficiency of other specified B group vitamins: Secondary | ICD-10-CM | POA: Diagnosis not present

## 2020-02-27 DIAGNOSIS — E1165 Type 2 diabetes mellitus with hyperglycemia: Secondary | ICD-10-CM

## 2020-02-27 DIAGNOSIS — Z1159 Encounter for screening for other viral diseases: Secondary | ICD-10-CM

## 2020-02-27 LAB — COMPREHENSIVE METABOLIC PANEL
ALT: 19 U/L (ref 0–53)
AST: 16 U/L (ref 0–37)
Albumin: 4.3 g/dL (ref 3.5–5.2)
Alkaline Phosphatase: 62 U/L (ref 39–117)
BUN: 14 mg/dL (ref 6–23)
CO2: 27 mEq/L (ref 19–32)
Calcium: 9.1 mg/dL (ref 8.4–10.5)
Chloride: 104 mEq/L (ref 96–112)
Creatinine, Ser: 1.15 mg/dL (ref 0.40–1.50)
GFR: 73.96 mL/min (ref >60.00)
Glucose, Bld: 165 mg/dL — ABNORMAL HIGH (ref 70–99)
Potassium: 4.6 mEq/L (ref 3.5–5.1)
Sodium: 138 mEq/L (ref 135–145)
Total Bilirubin: 0.6 mg/dL (ref 0.2–1.2)
Total Protein: 6.6 g/dL (ref 6.0–8.3)

## 2020-02-27 LAB — VITAMIN B12: Vitamin B-12: 232 pg/mL (ref 211–911)

## 2020-02-27 LAB — LIPID PANEL
Cholesterol: 105 mg/dL (ref 0–200)
HDL: 35.9 mg/dL — ABNORMAL LOW (ref >39.00)
LDL Cholesterol: 56 mg/dL (ref 0–99)
NonHDL: 69.27
Total CHOL/HDL Ratio: 3
Triglycerides: 65 mg/dL (ref 0.0–149.0)
VLDL: 13 mg/dL (ref 0.0–40.0)

## 2020-02-27 LAB — HEMOGLOBIN A1C: Hgb A1c MFr Bld: 7.2 % — ABNORMAL HIGH (ref 4.6–6.5)

## 2020-02-27 MED ORDER — METFORMIN HCL ER 500 MG PO TB24
1500.0000 mg | ORAL_TABLET | Freq: Every day | ORAL | 3 refills | Status: DC
Start: 2020-02-27 — End: 2022-03-27

## 2020-02-27 NOTE — Patient Instructions (Addendum)
You had the flu shot today  Ok to cut back on the metformin to 3 pills in the AM  For now, make sure to take the B12 1000 mcg per day  Please continue all other medications as before, and refills have been done if requested.  Please have the pharmacy call with any other refills you may need.  Please continue your efforts at being more active, low cholesterol diet, and weight control.  Please keep your appointments with your specialists as you may have planned  Please go to the LAB at the blood drawing area for the tests to be done  You will be contacted by phone if any changes need to be made immediately.  Otherwise, you will receive a letter about your results with an explanation, but please check with MyChart first.  Please remember to sign up for MyChart if you have not done so, as this will be important to you in the future with finding out test results, communicating by private email, and scheduling acute appointments online when needed.  Please make an Appointment to return in 6 months, or sooner if needed, also with Lab Appointment for testing done 3-5 days before at the FIRST FLOOR Lab (so this is for TWO appointments - please see the scheduling desk as you leave)

## 2020-02-27 NOTE — Progress Notes (Signed)
Subjective:    Patient ID: [name], male    DOB: 1971-05-01, 49 y.o.   MRN: [id number]  HPI  Here to f/u; overall doing ok,  Pt denies chest pain, increasing sob or doe, wheezing, orthopnea, PND, increased LE swelling, palpitations, dizziness or syncope.  Pt denies new neurological symptoms such as new headache, or facial or extremity weakness or numbness.  Pt denies polydipsia, polyuria, or low sugar episode.  Pt states overall good compliance with meds, mostly trying to follow appropriate diet, with wt overall stable,  but little exercise however.  Does have several wks ongoing nasal allergy symptoms with clearish congestion, itch and sneezing, without fever, pain, ST, cough, swelling or wheezing. Wt Readings from Last 3 Encounters:  02/27/20 205 lb (93 kg)  08/22/19 215 lb (97.5 kg)  01/18/19 215 lb (97.5 kg)   Past Medical History:  Diagnosis Date  . ALLERGIC RHINITIS 12/31/2008  . DIABETES MELLITUS, TYPE II 06/03/2009  . Dyslipidemia 10/31/2014  . FOOT PAIN, RIGHT 08/08/2010  . HYPERSOMNIA 11/30/2008  . Lumbar disc disease 03/01/2011  . OBSTRUCTIVE SLEEP APNEA 02/03/2009  . OTHER INFLAMMATORY DISORDER MALE GENITAL ORGANS 11/30/2008   Past Surgical History:  Procedure Laterality Date  . HERNIA REPAIR  2012   [facility]    reports that he has never smoked. He has never used smokeless tobacco. He reports current alcohol use. He reports that he does not use drugs. family history includes Cancer in an other family member; Coronary artery disease in his father; Depression in his maternal grandmother and mother; Diabetes in his father; Heart attack in his father; Hypertension in his father. Allergies  Allergen Reactions  . Penicillins     Childhood allergy   Current Outpatient Medications on File Prior to Visit  Medication Sig Dispense Refill  . Alcohol Swabs (B-D SINGLE USE SWABS REGULAR) PADS Use as directed    . aspirin 81 MG tablet Take 81 mg by mouth daily.      . Blood Glucose  Monitoring Suppl (FREESTYLE FREEDOM LITE) w/Device KIT Use to check blood sugars twice a day 1 kit 0  . diclofenac (VOLTAREN) 75 MG EC tablet Take 1 tablet (75 mg total) by mouth 2 (two) times daily as needed. 60 tablet 5  . FreeStyle Unistick II Lancets MISC Use to check blood sugars twice a day 200 each 1  . gabapentin (NEURONTIN) 300 MG capsule 1-2 tab by mouth at bedtime 90 capsule 3  . glucose blood (FREESTYLE LITE) test strip Use to check blood sugars twice a day 180 each 3  . JANUVIA 100 MG tablet TAKE 1 TABLET BY MOUTH DAILY. 90 tablet 3  . Lancet Device MISC Use as directed twice per day E11.9 200 each 11  . nitroGLYCERIN (NITRODUR - DOSED IN MG/24 HR) 0.2 mg/hr patch 1/4 patch daily 30 patch 1  . NON FORMULARY Reported on 10/02/2015    . vitamin B-12 (CYANOCOBALAMIN) 1000 MCG tablet Take 1 tablet (1,000 mcg total) by mouth daily. 90 tablet 3  . glucose blood test strip USE TO CHECK BLOOD SUGARS TWICE A DAY     No current facility-administered medications on file prior to visit.   Review of Systems All otherwise neg per pt    Objective:   Physical Exam BP 120/82 (BP Location: Left Arm, Patient Position: Sitting, Cuff Size: Large)   Pulse 75   Temp 98.2 F (36.8 C) (Oral)   Ht '5\' 11"'  (1.803 m)   Wt 205 lb (  93 kg)   SpO2 97%   BMI 28.59 kg/m  VS noted,  Constitutional: Pt appears in NAD HENT: Head: NCAT.  Right Ear: External ear normal.  Left Ear: External ear normal.  Eyes: . Pupils are equal, round, and reactive to light. Conjunctivae and EOM are normal Nose: without d/c or deformity Neck: Neck supple. Gross normal ROM Cardiovascular: Normal rate and regular rhythm.   Pulmonary/Chest: Effort normal and breath sounds without rales or wheezing.  Abd:  Soft, NT, ND, + BS, no organomegaly Neurological: Pt is alert. At baseline orientation, motor grossly intact Skin: Skin is warm. No rashes, other new lesions, no LE edema Psychiatric: Pt behavior is normal without  agitation  All otherwise neg per pt Lab Results  Component Value Date   WBC 8.9 08/22/2019   HGB 16.5 08/22/2019   HCT 48.4 08/22/2019   PLT 199.0 08/22/2019   GLUCOSE 165 (H) 02/27/2020   CHOL 105 02/27/2020   TRIG 65.0 02/27/2020   HDL 35.90 (L) 02/27/2020   LDLDIRECT 70.0 03/05/2017   LDLCALC 56 02/27/2020   ALT 19 02/27/2020   AST 16 02/27/2020   NA 138 02/27/2020   K 4.6 02/27/2020   CL 104 02/27/2020   CREATININE 1.15 02/27/2020   BUN 14 02/27/2020   CO2 27 02/27/2020   TSH 2.58 08/22/2019   PSA 0.55 08/22/2019   HGBA1C 7.2 (H) 02/27/2020   MICROALBUR <0.7 08/22/2019      Assessment & Plan:

## 2020-02-28 LAB — HEPATITIS C ANTIBODY
Hepatitis C Ab: NONREACTIVE
SIGNAL TO CUT-OFF: 0.01 (ref <1.00)

## 2020-03-03 ENCOUNTER — Encounter: Payer: Self-pay | Admitting: Internal Medicine

## 2020-03-03 NOTE — Assessment & Plan Note (Addendum)
For oral replacement  I spent 31 minutes in preparing to see the patient by review of recent labs, imaging and procedures, obtaining and reviewing separately obtained history, communicating with the patient and family or caregiver, ordering medications, tests or procedures, and documenting clinical information in the EHR including the differential Dx, treatment, and any further evaluation and other management of b12 def, dm, hld

## 2020-03-03 NOTE — Assessment & Plan Note (Addendum)
stable overall by history and exam, recent data reviewed with pt, and pt to continue medical treatment as before except to decrease the metformin ER 500 to 3 qd,  to f/u any worsening symptoms or concerns

## 2020-03-03 NOTE — Assessment & Plan Note (Signed)
For otc allegra and nasacort asd

## 2020-03-03 NOTE — Assessment & Plan Note (Signed)
stable overall by history and exam, recent data reviewed with pt, and pt to continue medical treatment as before,  to f/u any worsening symptoms or concerns  

## 2020-03-20 MED FILL — JANUVIA 100 MG TABLET: 100 | 30 days supply | Qty: 30 | Fill #6

## 2020-04-17 MED FILL — JANUVIA 100 MG TABLET: 100 | 30 days supply | Qty: 30 | Fill #7

## 2020-04-17 MED FILL — METFORMIN HCL ER 500 MG TB2: 500 | 90 days supply | Qty: 360 | Fill #2

## 2020-05-20 MED FILL — JANUVIA 100 MG TABLET: 100 | 30 days supply | Qty: 30 | Fill #8

## 2020-06-07 ENCOUNTER — Other Ambulatory Visit: Payer: No Typology Code available for payment source

## 2020-06-07 DIAGNOSIS — Z20822 Contact with and (suspected) exposure to covid-19: Secondary | ICD-10-CM

## 2020-06-11 ENCOUNTER — Encounter: Payer: Self-pay | Admitting: Internal Medicine

## 2020-06-11 LAB — NOVEL CORONAVIRUS, NAA: SARS-CoV-2, NAA: DETECTED — AB

## 2020-06-11 MED ORDER — DEXAMETHASONE 6 MG PO TABS: 6.0000 mg | ORAL_TABLET | Freq: Every day | ORAL | 0 refills | Status: AC

## 2020-06-19 MED FILL — JANUVIA 100 MG TABLET: 100 | 30 days supply | Qty: 30 | Fill #9

## 2020-07-14 ENCOUNTER — Encounter: Payer: Self-pay | Admitting: Internal Medicine

## 2020-07-14 DIAGNOSIS — R059 Cough, unspecified: Secondary | ICD-10-CM

## 2020-07-15 MED ORDER — HYDROCOD POLST-CPM POLST ER 10-8 MG/5ML PO SUER
5.0000 mL | Freq: Two times a day (BID) | ORAL | 0 refills | Status: DC | PRN
Start: 2020-07-15 — End: 2021-02-28

## 2020-07-17 MED FILL — JANUVIA 100 MG TABLET: 100 | 30 days supply | Qty: 30 | Fill #10

## 2020-07-23 MED FILL — JANUVIA 100 MG TABLET: 100 | 30 days supply | Qty: 30 | Fill #10

## 2020-08-22 ENCOUNTER — Other Ambulatory Visit: Payer: Self-pay | Admitting: Internal Medicine

## 2020-08-22 NOTE — Telephone Encounter (Signed)
Please refill as per office routine med refill policy (all routine meds refilled for 3 mo or monthly per pt preference up to one year from last visit, then month to month grace period for 3 mo, then further med refills will have to be denied)  

## 2020-08-23 ENCOUNTER — Other Ambulatory Visit: Payer: Self-pay | Admitting: Internal Medicine

## 2020-08-24 ENCOUNTER — Other Ambulatory Visit (HOSPITAL_COMMUNITY): Payer: Self-pay

## 2020-08-26 ENCOUNTER — Other Ambulatory Visit (HOSPITAL_COMMUNITY): Payer: Self-pay

## 2020-08-26 MED FILL — Metformin HCl Tab ER 24HR 500 MG: ORAL | 90 days supply | Qty: 360 | Fill #0 | Status: AC

## 2020-08-26 MED FILL — Sitagliptin Phosphate Tab 100 MG (Base Equiv): ORAL | 30 days supply | Qty: 30 | Fill #0 | Status: AC

## 2020-08-27 ENCOUNTER — Encounter: Payer: Self-pay | Admitting: Internal Medicine

## 2020-08-27 ENCOUNTER — Ambulatory Visit (INDEPENDENT_AMBULATORY_CARE_PROVIDER_SITE_OTHER): Payer: No Typology Code available for payment source | Admitting: Internal Medicine

## 2020-08-27 ENCOUNTER — Other Ambulatory Visit (HOSPITAL_COMMUNITY): Payer: Self-pay

## 2020-08-27 ENCOUNTER — Other Ambulatory Visit: Payer: Self-pay

## 2020-08-27 VITALS — BP 118/76 | HR 80 | Temp 98.3°F | Ht 71.0 in | Wt 199.8 lb

## 2020-08-27 DIAGNOSIS — E538 Deficiency of other specified B group vitamins: Secondary | ICD-10-CM | POA: Diagnosis not present

## 2020-08-27 DIAGNOSIS — Z125 Encounter for screening for malignant neoplasm of prostate: Secondary | ICD-10-CM | POA: Diagnosis not present

## 2020-08-27 DIAGNOSIS — Z0000 Encounter for general adult medical examination without abnormal findings: Secondary | ICD-10-CM | POA: Diagnosis not present

## 2020-08-27 DIAGNOSIS — Z0001 Encounter for general adult medical examination with abnormal findings: Secondary | ICD-10-CM

## 2020-08-27 DIAGNOSIS — E1165 Type 2 diabetes mellitus with hyperglycemia: Secondary | ICD-10-CM

## 2020-08-27 DIAGNOSIS — E785 Hyperlipidemia, unspecified: Secondary | ICD-10-CM

## 2020-08-27 DIAGNOSIS — R221 Localized swelling, mass and lump, neck: Secondary | ICD-10-CM

## 2020-08-27 DIAGNOSIS — E559 Vitamin D deficiency, unspecified: Secondary | ICD-10-CM

## 2020-08-27 DIAGNOSIS — Z1211 Encounter for screening for malignant neoplasm of colon: Secondary | ICD-10-CM

## 2020-08-27 LAB — BASIC METABOLIC PANEL
BUN: 13 mg/dL (ref 6–23)
CO2: 29 mEq/L (ref 19–32)
Calcium: 9.6 mg/dL (ref 8.4–10.5)
Chloride: 101 mEq/L (ref 96–112)
Creatinine, Ser: 1.05 mg/dL (ref 0.40–1.50)
GFR: 82.97 mL/min (ref >60.00)
Glucose, Bld: 221 mg/dL — ABNORMAL HIGH (ref 70–99)
Potassium: 4.3 mEq/L (ref 3.5–5.1)
Sodium: 139 mEq/L (ref 135–145)

## 2020-08-27 LAB — URINALYSIS, ROUTINE W REFLEX MICROSCOPIC
Bilirubin Urine: NEGATIVE
Hgb urine dipstick: NEGATIVE
Ketones, ur: NEGATIVE
Leukocytes,Ua: NEGATIVE
Nitrite: NEGATIVE
RBC / HPF: NONE SEEN (ref 0–?)
Specific Gravity, Urine: >=1.03 — AB (ref 1.000–1.030)
Total Protein, Urine: NEGATIVE
Urine Glucose: 250 — AB
Urobilinogen, UA: 0.2 (ref 0.0–1.0)
WBC, UA: NONE SEEN (ref 0–?)
pH: 5.5 (ref 5.0–8.0)

## 2020-08-27 LAB — CBC WITH DIFFERENTIAL/PLATELET
Basophils Absolute: 0.1 10*3/uL (ref 0.0–0.1)
Basophils Relative: 0.7 % (ref 0.0–3.0)
Eosinophils Absolute: 0.1 10*3/uL (ref 0.0–0.7)
Eosinophils Relative: 1.8 % (ref 0.0–5.0)
HCT: 48.2 % (ref 39.0–52.0)
Hemoglobin: 16.7 g/dL (ref 13.0–17.0)
Lymphocytes Relative: 26.5 % (ref 12.0–46.0)
Lymphs Abs: 2.1 10*3/uL (ref 0.7–4.0)
MCHC: 34.7 g/dL (ref 30.0–36.0)
MCV: 89.1 fl (ref 78.0–100.0)
Monocytes Absolute: 0.7 10*3/uL (ref 0.1–1.0)
Monocytes Relative: 8.1 % (ref 3.0–12.0)
Neutro Abs: 5.1 10*3/uL (ref 1.4–7.7)
Neutrophils Relative %: 62.9 % (ref 43.0–77.0)
Platelets: 223 10*3/uL (ref 150.0–400.0)
RBC: 5.41 Mil/uL (ref 4.22–5.81)
RDW: 14 % (ref 11.5–15.5)
WBC: 8.1 10*3/uL (ref 4.0–10.5)

## 2020-08-27 LAB — LIPID PANEL
Cholesterol: 114 mg/dL (ref 0–200)
HDL: 41.4 mg/dL (ref >39.00)
LDL Cholesterol: 56 mg/dL (ref 0–99)
NonHDL: 73.03
Total CHOL/HDL Ratio: 3
Triglycerides: 83 mg/dL (ref 0.0–149.0)
VLDL: 16.6 mg/dL (ref 0.0–40.0)

## 2020-08-27 LAB — VITAMIN D 25 HYDROXY (VIT D DEFICIENCY, FRACTURES): VITD: 29.19 ng/mL — ABNORMAL LOW (ref 30.00–100.00)

## 2020-08-27 LAB — HEPATIC FUNCTION PANEL
ALT: 20 U/L (ref 0–53)
AST: 15 U/L (ref 0–37)
Albumin: 4.5 g/dL (ref 3.5–5.2)
Alkaline Phosphatase: 65 U/L (ref 39–117)
Bilirubin, Direct: 0.2 mg/dL (ref 0.0–0.3)
Total Bilirubin: 0.9 mg/dL (ref 0.2–1.2)
Total Protein: 6.7 g/dL (ref 6.0–8.3)

## 2020-08-27 LAB — MICROALBUMIN / CREATININE URINE RATIO
Creatinine,U: 179.3 mg/dL
Microalb Creat Ratio: 0.7 mg/g (ref 0.0–30.0)
Microalb, Ur: 1.3 mg/dL (ref 0.0–1.9)

## 2020-08-27 LAB — VITAMIN B12: Vitamin B-12: 264 pg/mL (ref 211–911)

## 2020-08-27 LAB — HEMOGLOBIN A1C: Hgb A1c MFr Bld: 7.7 % — ABNORMAL HIGH (ref 4.6–6.5)

## 2020-08-27 LAB — PSA: PSA: 0.66 ng/mL (ref 0.10–4.00)

## 2020-08-27 LAB — TSH: TSH: 2.19 u[IU]/mL (ref 0.35–4.50)

## 2020-08-27 NOTE — Progress Notes (Signed)
Patient ID: [name], male   DOB: 1970-11-30, 50 y.o.   MRN: [id number]         Chief Complaint:: wellness exam and Follow-up  dm, low b12, and new left neck mass       HPI:  [name] is a 50 y.o. male here for wellness exam; due for eye exam and plans to call on his own soon;  Had covid infection in feb 2022;  He does not plan to have booster for now. Due for colonoscopy.  Declines Tdap.  O/w up to date with preventive referrals and immunizations.                        Also now has more chronic leg pain, taking gabapentin more regularly for RLS at bedtime and working better.  Pt denies chest pain, increased sob or doe, wheezing, orthopnea, PND, increased LE swelling, palpitations, dizziness or syncope.   Pt denies polydipsia, polyuria,   Pt states overall good compliance with meds, trying to follow lower cholesterol, diabetic diet, Lost wt intentional, constantly moving at work, getting ready to start working with trainer as well as muscle mass seems less than when he was younger, wife is concerned as he seems too thin and spindly.  Ave is 10K.   Pt denies fever, wt loss, night sweats, loss of appetite, or other constitutional symptoms  Pt is unaware of any swelling or mass to left mid neck. Wt Readings from Last 3 Encounters:  08/27/20 199 lb 12.8 oz (90.6 kg)  02/27/20 205 lb (93 kg)  08/22/19 215 lb (97.5 kg)   BP Readings from Last 3 Encounters:  08/27/20 118/76  02/27/20 120/82  08/22/19 (!) 146/96   Immunization History  Administered Date(s) Administered  . Influenza Split 02/23/2011, 03/03/2012  . Influenza Whole 01/31/2010  . Influenza,inj,Quad PF,6+ Mos 03/09/2013, 05/29/2015, 03/05/2017, 03/10/2018, 02/27/2020  . PFIZER(Purple Top)SARS-COV-2 Vaccination 12/30/2019, 01/20/2020  . Pneumococcal Polysaccharide-23 01/31/2010, 09/01/2016  . Td 11/30/2008   Health Maintenance Due  Topic Date Due  . COLONOSCOPY (Pts 45-71yr Insurance coverage will need to be confirmed)   Never done  . OPHTHALMOLOGY EXAM  05/08/2020    Past Medical History:  Diagnosis Date  . ALLERGIC RHINITIS 12/31/2008  . DIABETES MELLITUS, TYPE II 06/03/2009  . Dyslipidemia 10/31/2014  . FOOT PAIN, RIGHT 08/08/2010  . HYPERSOMNIA 11/30/2008  . Lumbar disc disease 03/01/2011  . OBSTRUCTIVE SLEEP APNEA 02/03/2009  . OTHER INFLAMMATORY DISORDER MALE GENITAL ORGANS 11/30/2008   Past Surgical History:  Procedure Laterality Date  . HERNIA REPAIR  2012   [facility]    reports that he has never smoked. He has never used smokeless tobacco. He reports current alcohol use. He reports that he does not use drugs. family history includes Cancer in an other family member; Coronary artery disease in his father; Depression in his maternal grandmother and mother; Diabetes in his father; Heart attack in his father; Hypertension in his father. Allergies  Allergen Reactions  . Penicillins     Childhood allergy   Current Outpatient Medications on File Prior to Visit  Medication Sig Dispense Refill  . Alcohol Swabs (B-D SINGLE USE SWABS REGULAR) PADS Use as directed    . aspirin 81 MG tablet Take 81 mg by mouth daily.    . Blood Glucose Monitoring Suppl (FREESTYLE FREEDOM LITE) w/Device KIT Use to check blood sugars twice a day 1 kit 0  . diclofenac (VOLTAREN) 75 MG EC tablet  Take 1 tablet (75 mg total) by mouth 2 (two) times daily as needed. 60 tablet 5  . FreeStyle Unistick II Lancets MISC Use to check blood sugars twice a day 200 each 1  . gabapentin (NEURONTIN) 300 MG capsule 1-2 tab by mouth at bedtime 90 capsule 3  . glucose blood (FREESTYLE LITE) test strip Use to check blood sugars twice a day 180 each 3  . glucose blood test strip USE TO CHECK BLOOD SUGARS TWICE A DAY    . Lancet Device MISC Use as directed twice per day E11.9 200 each 11  . metFORMIN (GLUCOPHAGE-XR) 500 MG 24 hr tablet Take 3 tablets (1,500 mg total) by mouth daily with breakfast. 270 tablet 3  . metFORMIN (GLUCOPHAGE-XR) 500 MG 24 hr  tablet Take 4 tablets (2,000 mg total) by mouth daily with breakfast. 360 tablet 0  . nitroGLYCERIN (NITRODUR - DOSED IN MG/24 HR) 0.2 mg/hr patch 1/4 patch daily 30 patch 1  . NON FORMULARY Reported on 10/02/2015    . sitaGLIPtin (JANUVIA) 100 MG tablet Take 1 tablet (100 mg total) by mouth daily. 30 tablet 0  . vitamin B-12 (CYANOCOBALAMIN) 1000 MCG tablet Take 1 tablet (1,000 mcg total) by mouth daily. 90 tablet 3  . chlorpheniramine-HYDROcodone (TUSSIONEX PENNKINETIC ER) 10-8 MG/5ML SUER Take 5 mLs by mouth every 12 (twelve) hours as needed for cough. (Patient not taking: Reported on 08/27/2020) 140 mL 0   No current facility-administered medications on file prior to visit.        ROS:  All others reviewed and negative.  Objective        PE:  BP 118/76 (BP Location: Left Arm, Patient Position: Sitting, Cuff Size: Large)   Pulse 80   Temp 98.3 F (36.8 C) (Oral)   Ht '5\' 11"'  (1.803 m)   Wt 199 lb 12.8 oz (90.6 kg)   SpO2 99%   BMI 27.87 kg/m                 Constitutional: Pt appears in NAD               HENT: Head: NCAT.                Right Ear: External ear normal.                 Left Ear: External ear normal.                Eyes: . Pupils are equal, round, and reactive to light. Conjunctivae and EOM are normal               Nose: without d/c or deformity               Neck: Neck supple. Gross normal ROM; pt has subtle non discrete area left mid neck asymtrical swelling with palpable firm to hard mass at the mid preSCM area of unclear etiology; no other mass or LA noted               Cardiovascular: Normal rate and regular rhythm.                 Pulmonary/Chest: Effort normal and breath sounds without rales or wheezing.                Abd:  Soft, NT, ND, + BS, no organomegaly               Neurological: Pt is alert. At baseline orientation,  motor grossly intact               Skin: Skin is warm. No rashes, no other new lesions, LE edema - none               Psychiatric: Pt  behavior is normal without agitation   Micro: none  Cardiac tracings I have personally interpreted today:  none  Pertinent Radiological findings (summarize): none   Lab Results  Component Value Date   WBC 8.1 08/27/2020   HGB 16.7 08/27/2020   HCT 48.2 08/27/2020   PLT 223.0 08/27/2020   GLUCOSE 221 (H) 08/27/2020   CHOL 114 08/27/2020   TRIG 83.0 08/27/2020   HDL 41.40 08/27/2020   LDLDIRECT 70.0 03/05/2017   LDLCALC 56 08/27/2020   ALT 20 08/27/2020   AST 15 08/27/2020   NA 139 08/27/2020   K 4.3 08/27/2020   CL 101 08/27/2020   CREATININE 1.05 08/27/2020   BUN 13 08/27/2020   CO2 29 08/27/2020   TSH 2.19 08/27/2020   PSA 0.66 08/27/2020   HGBA1C 7.7 (H) 08/27/2020   MICROALBUR 1.3 08/27/2020   Assessment/Plan:  [name] is a 50 y.o. White or Caucasian [1] male with  has a past medical history of ALLERGIC RHINITIS (12/31/2008), DIABETES MELLITUS, TYPE II (06/03/2009), Dyslipidemia (10/31/2014), FOOT PAIN, RIGHT (08/08/2010), HYPERSOMNIA (11/30/2008), Lumbar disc disease (03/01/2011), OBSTRUCTIVE SLEEP APNEA (02/03/2009), and OTHER INFLAMMATORY DISORDER MALE GENITAL ORGANS (11/30/2008).  Encounter for well adult exam with abnormal findings Age and sex appropriate education and counseling updated with regular exercise and diet Referrals for preventative services - for colonoscopy, and pt to call for eye exam Immunizations addressed - declines covid booster and Tdap Smoking counseling  - none needed Evidence for depression or other mood disorder - none significant Most recent labs reviewed. I have personally reviewed and have noted: 1) the patient's medical and social history 2) The patient's current medications and supplements 3) The patient's height, weight, and BMI have been recorded in the chart   Mass of left side of neck ? Etiology, for ENT referral but hopefully proves to be benign such as salivary gland related  Diabetes Lab Results  Component Value Date    HGBA1C 7.7 (H) 08/27/2020   Stable, pt to continue current medical treatment  - metformin, januvia - declines change in med in favor of contd wt loss and DM diet efforts   B12 deficiency Lab Results  Component Value Date   VITAMINB12 264 08/27/2020   Stable, cont oral replacement - b12 1000 mcg qd   Dyslipidemia Lab Results  Component Value Date   LDLCALC 56 08/27/2020   Stable, pt to continue current low chol diet, declines statin   Followup: Return in about 6 months (around 02/26/2021).  Cathlean Cower, MD 09/01/2020 5:53 AM [facility]

## 2020-08-27 NOTE — Patient Instructions (Addendum)
Please remember to call for your yearly eye exam  Please consider having the booster covid shot soon  You will be contacted regarding the referral for: colonoscopy  You will be contacted regarding the referral for: ENT for the knot to the left neck  Please continue all other medications as before, and refills have been done if requested.  Please have the pharmacy call with any other refills you may need.  Please continue your efforts at being more active, low cholesterol diet, and weight control.  You are otherwise up to date with prevention measures today.  Please keep your appointments with your specialists as you may have planned  Please go to the LAB at the blood drawing area for the tests to be done  You will be contacted by phone if any changes need to be made immediately.  Otherwise, you will receive a letter about your results with an explanation, but please check with MyChart first.  Please remember to sign up for MyChart if you have not done so, as this will be important to you in the future with finding out test results, communicating by private email, and scheduling acute appointments online when needed.  Please make an Appointment to return in 6 months, or sooner if needed, also with Lab Appointment for testing done 3-5 days before at the FIRST FLOOR Lab (so this is for TWO appointments - please see the scheduling desk as you leave)  Due to the ongoing Covid 19 pandemic, our lab now requires an appointment for any labs done at our office.  If you need labs done and do not have an appointment, please call our office ahead of time to schedule before presenting to the lab for your testing.

## 2020-09-01 ENCOUNTER — Encounter: Payer: Self-pay | Admitting: Internal Medicine

## 2020-09-01 NOTE — Assessment & Plan Note (Signed)
Lab Results  Component Value Date   LDLCALC 56 08/27/2020   Stable, pt to continue current low chol diet, declines statin

## 2020-09-01 NOTE — Assessment & Plan Note (Signed)
Lab Results  Component Value Date   VITAMINB12 264 08/27/2020   Stable, cont oral replacement - b12 1000 mcg qd

## 2020-09-01 NOTE — Assessment & Plan Note (Signed)
?   Etiology, for ENT referral but hopefully proves to be benign such as salivary gland related

## 2020-09-01 NOTE — Assessment & Plan Note (Signed)
Age and sex appropriate education and counseling updated with regular exercise and diet Referrals for preventative services - for colonoscopy, and pt to call for eye exam Immunizations addressed - declines covid booster and Tdap Smoking counseling  - none needed Evidence for depression or other mood disorder - none significant Most recent labs reviewed. I have personally reviewed and have noted: 1) the patient's medical and social history 2) The patient's current medications and supplements 3) The patient's height, weight, and BMI have been recorded in the chart

## 2020-09-01 NOTE — Addendum Note (Signed)
Addended by: Corwin Levins on: 09/01/2020 05:55 AM   Modules accepted: Orders

## 2020-09-01 NOTE — Assessment & Plan Note (Signed)
Lab Results  Component Value Date   HGBA1C 7.7 (H) 08/27/2020   Stable, pt to continue current medical treatment  - metformin, januvia - declines change in med in favor of contd wt loss and DM diet efforts

## 2020-09-26 ENCOUNTER — Other Ambulatory Visit (HOSPITAL_COMMUNITY): Payer: Self-pay

## 2020-09-26 ENCOUNTER — Other Ambulatory Visit: Payer: Self-pay | Admitting: Internal Medicine

## 2020-09-26 MED ORDER — SITAGLIPTIN PHOSPHATE 100 MG PO TABS
100.0000 mg | ORAL_TABLET | Freq: Every day | ORAL | 3 refills | Status: DC
  Filled 2020-09-26: qty 30, 30d supply, fill #0
  Filled 2020-10-25: qty 30, 30d supply, fill #1
  Filled 2020-11-27: qty 30, 30d supply, fill #2
  Filled 2020-12-30: qty 30, 30d supply, fill #3
  Filled 2021-01-29: qty 30, 30d supply, fill #4
  Filled 2021-03-04: qty 30, 30d supply, fill #5
  Filled 2021-04-03: qty 30, 30d supply, fill #6
  Filled 2021-05-05: qty 30, 30d supply, fill #7
  Filled 2021-06-06: qty 30, 30d supply, fill #8
  Filled 2021-07-04: qty 30, 30d supply, fill #9
  Filled 2021-08-01: qty 30, 30d supply, fill #10
  Filled 2021-08-28: qty 30, 30d supply, fill #11

## 2020-09-27 ENCOUNTER — Other Ambulatory Visit: Payer: Self-pay

## 2020-09-27 ENCOUNTER — Encounter (INDEPENDENT_AMBULATORY_CARE_PROVIDER_SITE_OTHER): Payer: Self-pay | Admitting: Otolaryngology

## 2020-09-27 ENCOUNTER — Other Ambulatory Visit (HOSPITAL_COMMUNITY): Payer: Self-pay

## 2020-09-27 ENCOUNTER — Ambulatory Visit (INDEPENDENT_AMBULATORY_CARE_PROVIDER_SITE_OTHER): Payer: No Typology Code available for payment source | Admitting: Otolaryngology

## 2020-09-27 VITALS — Temp 96.6°F

## 2020-09-27 DIAGNOSIS — R221 Localized swelling, mass and lump, neck: Secondary | ICD-10-CM | POA: Diagnosis not present

## 2020-09-27 NOTE — Progress Notes (Signed)
HPI: [name] is a 50 y.o. male who presents is referred by his PCP Dr. Cathlean [name] for evaluation of left neck nodule.  Apparently this was noted on routine examination with [name].  He does not smoke.  He has had no sore throat. He has not noted the nodule himself but points to the left mid neck where he felt like [name] located nodule..  Past Medical History:  Diagnosis Date  . ALLERGIC RHINITIS 12/31/2008  . DIABETES MELLITUS, TYPE II 06/03/2009  . Dyslipidemia 10/31/2014  . FOOT PAIN, RIGHT 08/08/2010  . HYPERSOMNIA 11/30/2008  . Lumbar disc disease 03/01/2011  . OBSTRUCTIVE SLEEP APNEA 02/03/2009  . OTHER INFLAMMATORY DISORDER MALE GENITAL ORGANS 11/30/2008   Past Surgical History:  Procedure Laterality Date  . HERNIA REPAIR  2012   [facility]   Social History   Socioeconomic History  . Marital status: Married    Spouse name: Not on file  . Number of children: 2  . Years of education: Not on file  . Highest education level: Not on file  Occupational History  . Occupation: Press photographer  [employer]    Employer: [employer]  Tobacco Use  . Smoking status: Never Smoker  . Smokeless tobacco: Never Used  Substance and Sexual Activity  . Alcohol use: Yes    Comment: social  . Drug use: No  . Sexual activity: Not on file  Other Topics Concern  . Not on file  Social History Narrative  . Not on file   Social Determinants of Health   Financial Resource Strain: Not on file  Food Insecurity: Not on file  Transportation Needs: Not on file  Physical Activity: Not on file  Stress: Not on file  Social Connections: Not on file   Family History  Problem Relation Age of Onset  . Depression Mother   . Diabetes Father   . Hypertension Father   . Heart attack Father   . Coronary artery disease Father   . Depression Maternal Grandmother   . Cancer Other        facial skin cancers   Allergies  Allergen Reactions  . Penicillins     Childhood allergy   Prior to  Admission medications   Medication Sig Start Date End Date Taking? Authorizing Provider  Alcohol Swabs (B-D SINGLE USE SWABS REGULAR) PADS Use as directed    [provider]  aspirin 81 MG tablet Take 81 mg by mouth daily.    [provider]  Blood Glucose Monitoring Suppl (FREESTYLE FREEDOM LITE) w/Device KIT Use to check blood sugars twice a day 10/03/19   Biagio Borg, MD  chlorpheniramine-HYDROcodone [facility] PENNKINETIC ER) 10-8 MG/5ML SUER Take 5 mLs by mouth every 12 (twelve) hours as needed for cough. Patient not taking: Reported on 08/27/2020 07/15/20   Biagio Borg, MD  diclofenac (VOLTAREN) 75 MG EC tablet Take 1 tablet (75 mg total) by mouth 2 (two) times daily as needed. 08/22/19   Biagio Borg, MD  FreeStyle Unistick II Lancets MISC Use to check blood sugars twice a day 10/03/19   Biagio Borg, MD  gabapentin (NEURONTIN) 300 MG capsule 1-2 tab by mouth at bedtime 08/22/19   Biagio Borg, MD  glucose blood (FREESTYLE LITE) test strip Use to check blood sugars twice a day 10/03/19   Biagio Borg, MD  glucose blood test strip USE TO CHECK BLOOD SUGARS TWICE A DAY 10/03/19   [provider]  Lancet Device MISC Use as directed twice per day E11.9 10/02/19   Biagio Borg, MD  metFORMIN (GLUCOPHAGE-XR) 500 MG 24 hr tablet Take 3 tablets (1,500 mg total) by mouth daily with breakfast. 02/27/20   Biagio Borg, MD  metFORMIN (GLUCOPHAGE-XR) 500 MG 24 hr tablet Take 4 tablets (2,000 mg total) by mouth daily with breakfast. 10/03/19   Biagio Borg, MD  nitroGLYCERIN (NITRODUR - DOSED IN MG/24 HR) 0.2 mg/hr patch 1/4 patch daily 08/29/18   Lyndal Pulley, DO  NON FORMULARY Reported on 10/02/2015    [provider]  sitaGLIPtin (JANUVIA) 100 MG tablet Take 1 tablet (100 mg total) by mouth daily. 09/26/20   Biagio Borg, MD  vitamin B-12 (CYANOCOBALAMIN) 1000 MCG tablet Take 1 tablet (1,000 mcg total) by mouth daily. 08/24/19   Biagio Borg, MD     Positive ROS:  Otherwise negative  All other systems have been reviewed and were otherwise negative with the exception of those mentioned in the HPI and as above.  Physical Exam: Constitutional: Alert, well-appearing, no acute distress Ears: External ears without lesions or tenderness. Ear canals are clear bilaterally with intact, clear TMs.  Nasal: External nose without lesions. Septum with minimal deformity and mild rhinitis.. Clear nasal passages Oral: Lips and gums without lesions. Tongue and palate mucosa without lesions. Posterior oropharynx clear.  Tonsils are small and symmetric bilaterally.  Indirect laryngoscopy revealed a clear base of tongue vallecula and epiglottis.  Vocal cords and arytenoids were clear.  Palpation of the base of tongue was soft. Neck: Examination of the neck revealed a prominent left carotid bulb on the left side more so than the right side as this is easily palpable but does pulsate and consistent with carotid artery bifurcation or carotid bulb.  He has no significant adenopathy in this region.  On palpation of the upper jugular nodes he has a few small palpable nodules but nothing of any significance.  Supraclavicular region was benign.  Thyroid region was benign. Respiratory: Breathing comfortably  Skin: No facial/neck lesions or rash noted.  Procedures  Assessment: I think the palpable nodule in the left neck represents the carotid bulb or bifurcation of the carotid artery as this is prominent on the left side of his neck more so than the right.  I do not appreciate any significant adenopathy on either side of the neck. Clear upper airway examination.  Plan: Reassured patient of normal examination with no significant masses or adenopathy.   Radene Journey, MD   CC:

## 2020-10-25 ENCOUNTER — Other Ambulatory Visit (HOSPITAL_COMMUNITY): Payer: Self-pay

## 2020-11-08 ENCOUNTER — Other Ambulatory Visit (HOSPITAL_COMMUNITY): Payer: Self-pay

## 2020-11-21 ENCOUNTER — Other Ambulatory Visit (HOSPITAL_COMMUNITY): Payer: Self-pay

## 2020-11-21 ENCOUNTER — Other Ambulatory Visit: Payer: Self-pay | Admitting: Internal Medicine

## 2020-11-21 MED ORDER — FREESTYLE LITE TEST VI STRP
ORAL_STRIP | 3 refills | Status: DC
  Filled 2020-11-21: qty 150, 75d supply, fill #0
  Filled 2021-05-05: qty 150, 75d supply, fill #1

## 2020-11-27 ENCOUNTER — Other Ambulatory Visit (HOSPITAL_COMMUNITY): Payer: Self-pay

## 2020-11-29 ENCOUNTER — Other Ambulatory Visit (HOSPITAL_COMMUNITY): Payer: Self-pay

## 2020-12-30 ENCOUNTER — Other Ambulatory Visit (HOSPITAL_COMMUNITY): Payer: Self-pay

## 2020-12-30 ENCOUNTER — Other Ambulatory Visit: Payer: Self-pay | Admitting: Internal Medicine

## 2020-12-30 NOTE — Telephone Encounter (Signed)
Please refill as per office routine med refill policy (all routine meds refilled for 3 mo or monthly per pt preference up to one year from last visit, then month to month grace period for 3 mo, then further med refills will have to be denied)  

## 2020-12-31 ENCOUNTER — Other Ambulatory Visit (HOSPITAL_COMMUNITY): Payer: Self-pay

## 2020-12-31 MED ORDER — METFORMIN HCL ER 500 MG PO TB24
2000.0000 mg | ORAL_TABLET | Freq: Every day | ORAL | 0 refills | Status: DC
  Filled 2020-12-31: qty 360, 90d supply, fill #0

## 2021-01-29 ENCOUNTER — Other Ambulatory Visit (HOSPITAL_COMMUNITY): Payer: Self-pay

## 2021-02-28 ENCOUNTER — Ambulatory Visit (INDEPENDENT_AMBULATORY_CARE_PROVIDER_SITE_OTHER): Payer: No Typology Code available for payment source | Admitting: Internal Medicine

## 2021-02-28 ENCOUNTER — Encounter: Payer: Self-pay | Admitting: Internal Medicine

## 2021-02-28 ENCOUNTER — Other Ambulatory Visit: Payer: Self-pay

## 2021-02-28 ENCOUNTER — Other Ambulatory Visit (HOSPITAL_COMMUNITY): Payer: Self-pay

## 2021-02-28 VITALS — BP 120/70 | HR 67 | Temp 98.2°F | Ht 71.0 in | Wt 199.4 lb

## 2021-02-28 DIAGNOSIS — E559 Vitamin D deficiency, unspecified: Secondary | ICD-10-CM

## 2021-02-28 DIAGNOSIS — Z23 Encounter for immunization: Secondary | ICD-10-CM | POA: Diagnosis not present

## 2021-02-28 DIAGNOSIS — Z0000 Encounter for general adult medical examination without abnormal findings: Secondary | ICD-10-CM

## 2021-02-28 DIAGNOSIS — E785 Hyperlipidemia, unspecified: Secondary | ICD-10-CM | POA: Diagnosis not present

## 2021-02-28 DIAGNOSIS — E538 Deficiency of other specified B group vitamins: Secondary | ICD-10-CM

## 2021-02-28 DIAGNOSIS — E1165 Type 2 diabetes mellitus with hyperglycemia: Secondary | ICD-10-CM

## 2021-02-28 LAB — HEPATIC FUNCTION PANEL
ALT: 19 U/L (ref 0–53)
AST: 16 U/L (ref 0–37)
Albumin: 4.4 g/dL (ref 3.5–5.2)
Alkaline Phosphatase: 69 U/L (ref 39–117)
Bilirubin, Direct: 0.1 mg/dL (ref 0.0–0.3)
Total Bilirubin: 0.8 mg/dL (ref 0.2–1.2)
Total Protein: 6.5 g/dL (ref 6.0–8.3)

## 2021-02-28 LAB — LIPID PANEL
Cholesterol: 115 mg/dL (ref 0–200)
HDL: 39.2 mg/dL (ref >39.00)
LDL Cholesterol: 59 mg/dL (ref 0–99)
NonHDL: 75.34
Total CHOL/HDL Ratio: 3
Triglycerides: 84 mg/dL (ref 0.0–149.0)
VLDL: 16.8 mg/dL (ref 0.0–40.0)

## 2021-02-28 LAB — BASIC METABOLIC PANEL
BUN: 19 mg/dL (ref 6–23)
CO2: 28 mEq/L (ref 19–32)
Calcium: 9.2 mg/dL (ref 8.4–10.5)
Chloride: 103 mEq/L (ref 96–112)
Creatinine, Ser: 1.14 mg/dL (ref 0.40–1.50)
GFR: 74.91 mL/min (ref >60.00)
Glucose, Bld: 177 mg/dL — ABNORMAL HIGH (ref 70–99)
Potassium: 4.5 mEq/L (ref 3.5–5.1)
Sodium: 139 mEq/L (ref 135–145)

## 2021-02-28 LAB — VITAMIN D 25 HYDROXY (VIT D DEFICIENCY, FRACTURES): VITD: 37.1 ng/mL (ref 30.00–100.00)

## 2021-02-28 LAB — VITAMIN B12: Vitamin B-12: 230 pg/mL (ref 211–911)

## 2021-02-28 LAB — HEMOGLOBIN A1C: Hgb A1c MFr Bld: 7.8 % — ABNORMAL HIGH (ref 4.6–6.5)

## 2021-02-28 MED ORDER — ACCU-CHEK FASTCLIX LANCETS MISC
11 refills | Status: DC
  Filled 2021-02-28: qty 100, 50d supply, fill #0
  Filled 2021-02-28: qty 102, 51d supply, fill #0
  Filled 2021-10-03: qty 102, 51d supply, fill #1

## 2021-02-28 NOTE — Patient Instructions (Addendum)
You had the flu shot today  Please take OTC Vitamin D3 at 2000 units per day, indefinitely  Please take OTC Vitamin B12 at 1000 mcg per day for at least 6 months  Please continue all other medications as before, and refills have been done if requested.  Please have the pharmacy call with any other refills you may need.  Please continue your efforts at being more active, low cholesterol diet, and weight control.  You are otherwise up to date with prevention measures today.  Please keep your appointments with your specialists as you may have planned  Please go to the LAB at the blood drawing area for the tests to be done  You will be contacted by phone if any changes need to be made immediately.  Otherwise, you will receive a letter about your results with an explanation, but please check with MyChart first.  Please remember to sign up for MyChart if you have not done so, as this will be important to you in the future with finding out test results, communicating by private email, and scheduling acute appointments online when needed.  Please make an Appointment to return in 6 months, or sooner if needed

## 2021-02-28 NOTE — Assessment & Plan Note (Signed)
Last vitamin D Lab Results  Component Value Date   VD25OH 29.19 (L) 08/27/2020   Low, to start oral replacement

## 2021-02-28 NOTE — Progress Notes (Signed)
Patient ID: [name], male   DOB: [birth year], 50 y.o.   MRN: [id number]        Chief Complaint: follow up HTN, HLD and hyperglycemia, low b12 and D       HPI:  [name] is a 50 y.o. male here overall doing ok; Plans to see optho soon, and colonoscopy as well  Pt denies chest pain, increased sob or doe, wheezing, orthopnea, PND, increased LE swelling, palpitations, dizziness or syncope. Pt denies polydipsia, polyuria, or new focal neuro s/s.   Pt denies fever, wt loss, night sweats, loss of appetite, or other constitutional symptoms  Not taking vit d or B12.  Wt some improved from 1 yr ago, but admits to a higher than needed fast food diet due to work demands    Wt Readings from Last 3 Encounters:  02/28/21 199 lb 6.4 oz (90.4 kg)  08/27/20 199 lb 12.8 oz (90.6 kg)  02/27/20 205 lb (93 kg)   BP Readings from Last 3 Encounters:  02/28/21 120/70  08/27/20 118/76  02/27/20 120/82         Past Medical History:  Diagnosis Date   ALLERGIC RHINITIS 12/31/2008   DIABETES MELLITUS, TYPE II 06/03/2009   Dyslipidemia 10/31/2014   FOOT PAIN, RIGHT 08/08/2010   HYPERSOMNIA 11/30/2008   Lumbar disc disease 03/01/2011   OBSTRUCTIVE SLEEP APNEA 02/03/2009   OTHER INFLAMMATORY DISORDER MALE GENITAL ORGANS 11/30/2008   Past Surgical History:  Procedure Laterality Date   HERNIA REPAIR  2012   [facility]    reports that he has never smoked. He has never used smokeless tobacco. He reports current alcohol use. He reports that he does not use drugs. family history includes Cancer in an other family member; Coronary artery disease in his father; Depression in his maternal grandmother and mother; Diabetes in his father; Heart attack in his father; Hypertension in his father. Allergies  Allergen Reactions   Penicillins     Childhood allergy   Current Outpatient Medications on File Prior to Visit  Medication Sig Dispense Refill   Alcohol Swabs (B-D SINGLE USE SWABS REGULAR) PADS Use as directed     aspirin  81 MG tablet Take 81 mg by mouth daily.     Blood Glucose Monitoring Suppl (FREESTYLE FREEDOM LITE) w/Device KIT Use to check blood sugars twice a day 1 kit 0   diclofenac (VOLTAREN) 75 MG EC tablet Take 1 tablet (75 mg total) by mouth 2 (two) times daily as needed. 60 tablet 5   gabapentin (NEURONTIN) 300 MG capsule 1-2 tab by mouth at bedtime 90 capsule 3   glucose blood (FREESTYLE LITE) test strip Use to check blood sugars twice a day 180 each 3   glucose blood test strip USE TO CHECK BLOOD SUGARS TWICE A DAY     Lancet Device MISC Use as directed twice per day E11.9 200 each 11   metFORMIN (GLUCOPHAGE-XR) 500 MG 24 hr tablet Take 3 tablets (1,500 mg total) by mouth daily with breakfast. 270 tablet 3   metFORMIN (GLUCOPHAGE-XR) 500 MG 24 hr tablet Take 4 tablets (2,000 mg total) by mouth daily with breakfast. 360 tablet 0   nitroGLYCERIN (NITRODUR - DOSED IN MG/24 HR) 0.2 mg/hr patch 1/4 patch daily 30 patch 1   NON FORMULARY Reported on 10/02/2015     sitaGLIPtin (JANUVIA) 100 MG tablet Take 1 tablet (100 mg total) by mouth daily. 90 tablet 3   vitamin B-12 (CYANOCOBALAMIN) 1000 MCG tablet Take 1  tablet (1,000 mcg total) by mouth daily. 90 tablet 3   No current facility-administered medications on file prior to visit.        ROS:  All others reviewed and negative.  Objective        PE:  BP 120/70 (BP Location: Right Arm, Patient Position: Sitting, Cuff Size: Large)   Pulse 67   Temp 98.2 F (36.8 C) (Oral)   Ht '5\' 11"'  (1.803 m)   Wt 199 lb 6.4 oz (90.4 kg)   SpO2 98%   BMI 27.81 kg/m                 Constitutional: Pt appears in NAD               HENT: Head: NCAT.                Right Ear: External ear normal.                 Left Ear: External ear normal.                Eyes: . Pupils are equal, round, and reactive to light. Conjunctivae and EOM are normal               Nose: without d/c or deformity               Neck: Neck supple. Gross normal ROM                Cardiovascular: Normal rate and regular rhythm.                 Pulmonary/Chest: Effort normal and breath sounds without rales or wheezing.                Abd:  Soft, NT, ND, + BS, no organomegaly               Neurological: Pt is alert. At baseline orientation, motor grossly intact               Skin: Skin is warm. No rashes, no other new lesions, LE edema - none               Psychiatric: Pt behavior is normal without agitation   Micro: none  Cardiac tracings I have personally interpreted today:  none  Pertinent Radiological findings (summarize): none   Lab Results  Component Value Date   WBC 8.1 08/27/2020   HGB 16.7 08/27/2020   HCT 48.2 08/27/2020   PLT 223.0 08/27/2020   GLUCOSE 177 (H) 02/28/2021   CHOL 115 02/28/2021   TRIG 84.0 02/28/2021   HDL 39.20 02/28/2021   LDLDIRECT 70.0 03/05/2017   LDLCALC 59 02/28/2021   ALT 19 02/28/2021   AST 16 02/28/2021   NA 139 02/28/2021   K 4.5 02/28/2021   CL 103 02/28/2021   CREATININE 1.14 02/28/2021   BUN 19 02/28/2021   CO2 28 02/28/2021   TSH 2.19 08/27/2020   PSA 0.66 08/27/2020   HGBA1C 7.8 (H) 02/28/2021   MICROALBUR 1.3 08/27/2020   Assessment/Plan:  [name] is a 50 y.o. White or Caucasian [1] male with  has a past medical history of ALLERGIC RHINITIS (12/31/2008), DIABETES MELLITUS, TYPE II (06/03/2009), Dyslipidemia (10/31/2014), FOOT PAIN, RIGHT (08/08/2010), HYPERSOMNIA (11/30/2008), Lumbar disc disease (03/01/2011), OBSTRUCTIVE SLEEP APNEA (02/03/2009), and OTHER INFLAMMATORY DISORDER MALE GENITAL ORGANS (11/30/2008).  Vitamin D deficiency Last vitamin D Lab Results  Component Value Date   VD25OH 29.19 (  L) 08/27/2020   Low, to start oral replacement   Diabetes Most recent A1c 7.7 last visit, to work on better DM diet, check a1c and may need OHA increased   B12 deficiency Lab Results  Component Value Date   VITAMINB12 230 02/28/2021   Low normal, for start oral replacement - b12 1000 mcg  qd   Dyslipidemia Lab Results  Component Value Date   LDLCALC 59 02/28/2021   Stable, pt to continue current diet - declines statin  Followup: Return in about 6 months (around 08/29/2021).  Cathlean Cower, MD 03/01/2021 5:10 PM [facility]

## 2021-03-01 ENCOUNTER — Encounter: Payer: Self-pay | Admitting: Internal Medicine

## 2021-03-01 NOTE — Assessment & Plan Note (Signed)
Lab Results  Component Value Date   LDLCALC 59 02/28/2021   Stable, pt to continue current diet - declines statin

## 2021-03-01 NOTE — Assessment & Plan Note (Signed)
Most recent A1c 7.7 last visit, to work on better DM diet, check a1c and may need OHA increased

## 2021-03-01 NOTE — Assessment & Plan Note (Signed)
Lab Results  Component Value Date   VITAMINB12 230 02/28/2021   Low normal, for start oral replacement - b12 1000 mcg qd

## 2021-03-02 ENCOUNTER — Encounter: Payer: Self-pay | Admitting: Internal Medicine

## 2021-03-04 ENCOUNTER — Other Ambulatory Visit (HOSPITAL_COMMUNITY): Payer: Self-pay

## 2021-04-03 ENCOUNTER — Other Ambulatory Visit (HOSPITAL_COMMUNITY): Payer: Self-pay

## 2021-05-05 ENCOUNTER — Other Ambulatory Visit (HOSPITAL_COMMUNITY): Payer: Self-pay

## 2021-05-07 ENCOUNTER — Other Ambulatory Visit (HOSPITAL_COMMUNITY): Payer: Self-pay

## 2021-05-07 ENCOUNTER — Other Ambulatory Visit: Payer: Self-pay | Admitting: Internal Medicine

## 2021-05-07 NOTE — Telephone Encounter (Signed)
Please refill as per office routine med refill policy (all routine meds to be refilled for 3 mo or monthly (per pt preference) up to one year from last visit, then month to month grace period for 3 mo, then further med refills will have to be denied) ? ?

## 2021-05-08 ENCOUNTER — Other Ambulatory Visit (HOSPITAL_COMMUNITY): Payer: Self-pay

## 2021-05-08 MED ORDER — METFORMIN HCL ER 500 MG PO TB24
2000.0000 mg | ORAL_TABLET | Freq: Every day | ORAL | 2 refills | Status: DC
  Filled 2021-05-08: qty 360, 90d supply, fill #0
  Filled 2021-08-01: qty 360, 90d supply, fill #1
  Filled 2021-10-03 – 2021-10-14 (×2): qty 360, 90d supply, fill #2

## 2021-06-06 ENCOUNTER — Other Ambulatory Visit (HOSPITAL_COMMUNITY): Payer: Self-pay

## 2021-07-04 ENCOUNTER — Other Ambulatory Visit (HOSPITAL_COMMUNITY): Payer: Self-pay

## 2021-08-01 ENCOUNTER — Other Ambulatory Visit (HOSPITAL_COMMUNITY): Payer: Self-pay

## 2021-08-28 ENCOUNTER — Other Ambulatory Visit (HOSPITAL_COMMUNITY): Payer: Self-pay

## 2021-09-02 ENCOUNTER — Ambulatory Visit: Payer: No Typology Code available for payment source | Admitting: Internal Medicine

## 2021-09-15 ENCOUNTER — Ambulatory Visit (INDEPENDENT_AMBULATORY_CARE_PROVIDER_SITE_OTHER): Payer: No Typology Code available for payment source | Admitting: Internal Medicine

## 2021-09-15 ENCOUNTER — Other Ambulatory Visit: Payer: Self-pay | Admitting: Internal Medicine

## 2021-09-15 ENCOUNTER — Telehealth: Payer: Self-pay

## 2021-09-15 VITALS — BP 128/74 | HR 80 | Temp 97.7°F | Ht 71.0 in | Wt 209.0 lb

## 2021-09-15 DIAGNOSIS — Z0001 Encounter for general adult medical examination with abnormal findings: Secondary | ICD-10-CM

## 2021-09-15 DIAGNOSIS — Z1211 Encounter for screening for malignant neoplasm of colon: Secondary | ICD-10-CM

## 2021-09-15 DIAGNOSIS — E1165 Type 2 diabetes mellitus with hyperglycemia: Secondary | ICD-10-CM | POA: Diagnosis not present

## 2021-09-15 DIAGNOSIS — E559 Vitamin D deficiency, unspecified: Secondary | ICD-10-CM | POA: Diagnosis not present

## 2021-09-15 DIAGNOSIS — Z0000 Encounter for general adult medical examination without abnormal findings: Secondary | ICD-10-CM | POA: Diagnosis not present

## 2021-09-15 DIAGNOSIS — E785 Hyperlipidemia, unspecified: Secondary | ICD-10-CM

## 2021-09-15 DIAGNOSIS — E538 Deficiency of other specified B group vitamins: Secondary | ICD-10-CM

## 2021-09-15 LAB — BASIC METABOLIC PANEL
BUN: 14 mg/dL (ref 6–23)
CO2: 26 mEq/L (ref 19–32)
Calcium: 9.3 mg/dL (ref 8.4–10.5)
Chloride: 102 mEq/L (ref 96–112)
Creatinine, Ser: 1.06 mg/dL (ref 0.40–1.50)
GFR: 81.43 mL/min (ref >60.00)
Glucose, Bld: 228 mg/dL — ABNORMAL HIGH (ref 70–99)
Potassium: 5 mEq/L (ref 3.5–5.1)
Sodium: 137 mEq/L (ref 135–145)

## 2021-09-15 LAB — URINALYSIS, ROUTINE W REFLEX MICROSCOPIC
Bilirubin Urine: NEGATIVE
Hgb urine dipstick: NEGATIVE
Ketones, ur: NEGATIVE
Leukocytes,Ua: NEGATIVE
Nitrite: NEGATIVE
RBC / HPF: NONE SEEN (ref 0–?)
Specific Gravity, Urine: 1.025 (ref 1.000–1.030)
Total Protein, Urine: NEGATIVE
Urine Glucose: 100 — AB
Urobilinogen, UA: 0.2 (ref 0.0–1.0)
pH: 6 (ref 5.0–8.0)

## 2021-09-15 LAB — CBC WITH DIFFERENTIAL/PLATELET
Basophils Absolute: 0.1 10*3/uL (ref 0.0–0.1)
Basophils Relative: 0.7 % (ref 0.0–3.0)
Eosinophils Absolute: 0.2 10*3/uL (ref 0.0–0.7)
Eosinophils Relative: 2.4 % (ref 0.0–5.0)
HCT: 47 % (ref 39.0–52.0)
Hemoglobin: 16.2 g/dL (ref 13.0–17.0)
Lymphocytes Relative: 26.9 % (ref 12.0–46.0)
Lymphs Abs: 2.1 10*3/uL (ref 0.7–4.0)
MCHC: 34.5 g/dL (ref 30.0–36.0)
MCV: 90.1 fl (ref 78.0–100.0)
Monocytes Absolute: 0.6 10*3/uL (ref 0.1–1.0)
Monocytes Relative: 7.9 % (ref 3.0–12.0)
Neutro Abs: 5 10*3/uL (ref 1.4–7.7)
Neutrophils Relative %: 62.1 % (ref 43.0–77.0)
Platelets: 200 10*3/uL (ref 150.0–400.0)
RBC: 5.21 Mil/uL (ref 4.22–5.81)
RDW: 13.8 % (ref 11.5–15.5)
WBC: 8 10*3/uL (ref 4.0–10.5)

## 2021-09-15 LAB — PSA: PSA: 0.5 ng/mL (ref 0.10–4.00)

## 2021-09-15 LAB — LIPID PANEL
Cholesterol: 129 mg/dL (ref 0–200)
HDL: 42.5 mg/dL (ref >39.00)
LDL Cholesterol: 61 mg/dL (ref 0–99)
NonHDL: 86.81
Total CHOL/HDL Ratio: 3
Triglycerides: 131 mg/dL (ref 0.0–149.0)
VLDL: 26.2 mg/dL (ref 0.0–40.0)

## 2021-09-15 LAB — VITAMIN D 25 HYDROXY (VIT D DEFICIENCY, FRACTURES): VITD: 26.63 ng/mL — ABNORMAL LOW (ref 30.00–100.00)

## 2021-09-15 LAB — HEPATIC FUNCTION PANEL
ALT: 21 U/L (ref 0–53)
AST: 15 U/L (ref 0–37)
Albumin: 4.4 g/dL (ref 3.5–5.2)
Alkaline Phosphatase: 80 U/L (ref 39–117)
Bilirubin, Direct: 0.1 mg/dL (ref 0.0–0.3)
Total Bilirubin: 0.6 mg/dL (ref 0.2–1.2)
Total Protein: 6.6 g/dL (ref 6.0–8.3)

## 2021-09-15 LAB — HEMOGLOBIN A1C: Hgb A1c MFr Bld: 8 % — ABNORMAL HIGH (ref 4.6–6.5)

## 2021-09-15 LAB — TSH: TSH: 3.04 u[IU]/mL (ref 0.35–5.50)

## 2021-09-15 LAB — MICROALBUMIN / CREATININE URINE RATIO
Creatinine,U: 149 mg/dL
Microalb Creat Ratio: 0.5 mg/g (ref 0.0–30.0)
Microalb, Ur: <0.7 mg/dL (ref 0.0–1.9)

## 2021-09-15 LAB — VITAMIN B12: Vitamin B-12: 199 pg/mL — ABNORMAL LOW (ref 211–911)

## 2021-09-15 MED ORDER — OZEMPIC (0.25 OR 0.5 MG/DOSE) 2 MG/1.5ML ~~LOC~~ SOPN: 0.5000 mg | PEN_INJECTOR | SUBCUTANEOUS | 3 refills | Status: DC

## 2021-09-15 MED ORDER — VITAMIN B-12 1000 MCG PO TABS: 1000.0000 ug | ORAL_TABLET | Freq: Every day | ORAL | 3 refills | Status: DC

## 2021-09-15 MED ORDER — CHOLECALCIFEROL 50 MCG (2000 UT) PO TABS: ORAL_TABLET | ORAL | 99 refills | Status: DC

## 2021-09-15 NOTE — Patient Instructions (Addendum)
Please consider the wegovy for weight loss ? ?Please consider the Shingrix shingels shot at the pharmacy if your insurance covers ? ?You will be contacted regarding the referral for: colonoscopy ? ?Please take OTC Vitamin D3 at 2000 units per day, indefinitely, ? ?Please also take B12 1000 mcg per day for 6 months only ? ?Please continue all other medications as before, and refills have been done if requested. ? ?Please have the pharmacy call with any other refills you may need. ? ?Please continue your efforts at being more active, low cholesterol diet, and weight control. ? ?You are otherwise up to date with prevention measures today. ? ?Please keep your appointments with your specialists as you may have planned ? ?Please go to the LAB at the blood drawing area for the tests to be done ? ?You will be contacted by phone if any changes need to be made immediately.  Otherwise, you will receive a letter about your results with an explanation, but please check with MyChart first. ? ?Please remember to sign up for MyChart if you have not done so, as this will be important to you in the future with finding out test results, communicating by private email, and scheduling acute appointments online when needed. ? ?Please make an Appointment to return in 6 months, or sooner if needed ?

## 2021-09-15 NOTE — Telephone Encounter (Signed)
Patient states that he will discuss Ozempic with his wife and do research then let us know if he wishes to take ?

## 2021-09-15 NOTE — Progress Notes (Signed)
Patient ID: [name], male   DOB: 12/15/1970, 51 y.o.   MRN: [id number] ? ? ? ?     Chief Complaint:: wellness exam and Follow-up ? Obesity, dm, low vitd, low b12 ? ?     HPI:  [name] is a 51 y.o. male here for wellness exam; for colonoscopy, declines shingrix,  tdap o/w up to date ?         ?              Also has some increased feet cramps lately with more standing and walking at work, has to wear steel toes at work sometimes.  Some numbness comes and goes. Pt denies chest pain, increased sob or doe, wheezing, orthopnea, PND, increased LE swelling, palpitations, dizziness or syncope.   Pt denies polydipsia, polyuria, or new focal neuro s/s.    Pt denies fever, wt loss, night sweats, loss of appetite, or other constitutional symptoms  Hard to lose wt, not ready for ozempic for now but will consider.    Not taking Vit D or vit b12  ?  ?Wt Readings from Last 3 Encounters:  ?09/15/21 209 lb (94.8 kg)  ?02/28/21 199 lb 6.4 oz (90.4 kg)  ?08/27/20 199 lb 12.8 oz (90.6 kg)  ? ?BP Readings from Last 3 Encounters:  ?09/15/21 128/74  ?02/28/21 120/70  ?08/27/20 118/76  ? ?Immunization History  ?Administered Date(s) Administered  ? Influenza Split 02/23/2011, 03/03/2012  ? Influenza Whole 01/31/2010  ? Influenza,inj,Quad PF,6+ Mos 03/09/2013, 05/29/2015, 03/05/2017, 03/10/2018, 02/27/2020, 02/28/2021  ? PFIZER(Purple Top)SARS-COV-2 Vaccination 12/30/2019, 01/20/2020  ? Pneumococcal Polysaccharide-23 01/31/2010, 09/01/2016  ? Td 11/30/2008  ? ?Health Maintenance Due  ?Topic Date Due  ? COLONOSCOPY (Pts 45-97yr Insurance coverage will need to be confirmed)  Never done  ? ?  ? ?Past Medical History:  ?Diagnosis Date  ? ALLERGIC RHINITIS 12/31/2008  ? DIABETES MELLITUS, TYPE II 06/03/2009  ? Dyslipidemia 10/31/2014  ? FOOT PAIN, RIGHT 08/08/2010  ? HYPERSOMNIA 11/30/2008  ? Lumbar disc disease 03/01/2011  ? OBSTRUCTIVE SLEEP APNEA 02/03/2009  ? OTHER INFLAMMATORY DISORDER MALE GENITAL ORGANS 11/30/2008  ? ?Past Surgical History:   ?Procedure Laterality Date  ? HERNIA REPAIR  2012  ? [facility]  ? ? reports that he has never smoked. He has never used smokeless tobacco. He reports current alcohol use. He reports that he does not use drugs. ?family history includes Cancer in an other family member; Coronary artery disease in his father; Depression in his maternal grandmother and mother; Diabetes in his father; Heart attack in his father; Hypertension in his father. ?Allergies  ?Allergen Reactions  ? Penicillins   ?  Childhood allergy  ? ?Current Outpatient Medications on File Prior to Visit  ?Medication Sig Dispense Refill  ? Accu-Chek FastClix Lancets MISC Use as directed twice daily 102 each 11  ? Alcohol Swabs (B-D SINGLE USE SWABS REGULAR) PADS Use as directed    ? aspirin 81 MG tablet Take 81 mg by mouth daily.    ? Blood Glucose Monitoring Suppl (FREESTYLE FREEDOM LITE) w/Device KIT Use to check blood sugars twice a day 1 kit 0  ? diclofenac (VOLTAREN) 75 MG EC tablet Take 1 tablet (75 mg total) by mouth 2 (two) times daily as needed. 60 tablet 5  ? gabapentin (NEURONTIN) 300 MG capsule 1-2 tab by mouth at bedtime 90 capsule 3  ? glucose blood (FREESTYLE LITE) test strip Use to check blood sugars twice a day 180 each 3  ?  glucose blood test strip USE TO CHECK BLOOD SUGARS TWICE A DAY    ? Lancet Device MISC Use as directed twice per day E11.9 200 each 11  ? metFORMIN (GLUCOPHAGE-XR) 500 MG 24 hr tablet Take 3 tablets (1,500 mg total) by mouth daily with breakfast. 270 tablet 3  ? metFORMIN (GLUCOPHAGE-XR) 500 MG 24 hr tablet Take 4 tablets (2,000 mg total) by mouth daily with breakfast. 360 tablet 2  ? nitroGLYCERIN (NITRODUR - DOSED IN MG/24 HR) 0.2 mg/hr patch 1/4 patch daily 30 patch 1  ? NON FORMULARY Reported on 10/02/2015    ? sitaGLIPtin (JANUVIA) 100 MG tablet Take 1 tablet (100 mg total) by mouth daily. 90 tablet 3  ? ?No current facility-administered medications on file prior to visit.  ? ?     ROS:  All others reviewed and  negative. ? ?Objective  ? ?     PE:  BP 128/74 (BP Location: Right Arm, Patient Position: Sitting, Cuff Size: Large)   Pulse 80   Temp 97.7 ?F (36.5 ?C) (Oral)   Ht _0  (1.803 m)   Wt 209 lb (94.8 kg)   SpO2 97%   BMI 29.15 kg/m?  ? ?              Constitutional: Pt appears in NAD ?              HENT: Head: NCAT.  ?              Right Ear: External ear normal.   ?              Left Ear: External ear normal.  ?              Eyes: . Pupils are equal, round, and reactive to light. Conjunctivae and EOM are normal ?              Nose: without d/c or deformity ?              Neck: Neck supple. Gross normal ROM ?              Cardiovascular: Normal rate and regular rhythm.   ?              Pulmonary/Chest: Effort normal and breath sounds without rales or wheezing.  ?              Abd:  Soft, NT, ND, + BS, no organomegaly ?              Neurological: Pt is alert. At baseline orientation, motor grossly intact ?              Skin: Skin is warm. No rashes, no other new lesions, LE edema - none ?              Psychiatric: Pt behavior is normal without agitation  ? ?Micro: none ? ?Cardiac tracings I have personally interpreted today:  none ? ?Pertinent Radiological findings (summarize): none  ? ?Lab Results  ?Component Value Date  ? WBC 8.0 09/15/2021  ? HGB 16.2 09/15/2021  ? HCT 47.0 09/15/2021  ? PLT 200.0 09/15/2021  ? GLUCOSE 228 (H) 09/15/2021  ? CHOL 129 09/15/2021  ? TRIG 131.0 09/15/2021  ? HDL 42.50 09/15/2021  ? LDLDIRECT 70.0 03/05/2017  ? [place] 61 09/15/2021  ? ALT 21 09/15/2021  ? AST 15 09/15/2021  ? NA 137 09/15/2021  ? K 5.0 09/15/2021  ? CL 102 09/15/2021  ?  CREATININE 1.06 09/15/2021  ? BUN 14 09/15/2021  ? CO2 26 09/15/2021  ? TSH 3.04 09/15/2021  ? PSA 0.50 09/15/2021  ? HGBA1C 8.0 (H) 09/15/2021  ? MICROALBUR <0.7 09/15/2021  ? ?Assessment/Plan:  ?[name] is a 51 y.o. White or Caucasian [1] male with  has a past medical history of ALLERGIC RHINITIS (12/31/2008), DIABETES MELLITUS, TYPE II  (06/03/2009), Dyslipidemia (10/31/2014), FOOT PAIN, RIGHT (08/08/2010), HYPERSOMNIA (11/30/2008), Lumbar disc disease (03/01/2011), OBSTRUCTIVE SLEEP APNEA (02/03/2009), and OTHER INFLAMMATORY DISORDER MALE GENITAL ORGANS (11/30/2008). ? ?Encounter for well adult exam with abnormal findings ?Age and sex appropriate education and counseling updated with regular exercise and diet ?Referrals for preventative services - for colonoscopy ?Immunizations addressed - declinees shingrix, tdap ?Smoking counseling  - none needed ?Evidence for depression or other mood disorder - none significant ?Most recent labs reviewed. ?I have personally reviewed and have noted: ?1) the patient's medical and social history ?2) The patient's current medications and supplements ?3) The patient's height, weight, and BMI have been recorded in the chart ? ? ?Diabetes ?Lab Results  ?Component Value Date  ? HGBA1C 8.0 (H) 09/15/2021  ? ?uncontrolled, pt to continue current medical treatment metfomrin, januvia and d/w pt regarding ozempic and he wants to consider more before starting this ? ? ?Dyslipidemia ?Lab Results  ?Component Value Date  ? [place] 61 09/15/2021  ? ?Stable, pt to continue current low chol diet, declines statin ? ? ?B12 deficiency ?Lab Results  ?Component Value Date  ? VITAMINB12 199 (L) 09/15/2021  ? ?Low, to start oral replacement - b12 1000 mcg qd ? ? ?Vitamin D deficiency ?Last vitamin D ?Lab Results  ?Component Value Date  ? VD25OH 26.63 (L) 09/15/2021  ? ?Low, to increased oral replacement to 2000 u qd ? ?Followup: Return in about 6 months (around 03/17/2022). ? ?Cathlean Cower, MD 09/18/2021 8:07 PM ?[facility] ?

## 2021-09-15 NOTE — Telephone Encounter (Signed)
-----   Message from Biagio Borg, MD sent at 09/15/2021  1:02 PM EDT ----- ?The test results show that your current treatment is OK, as the tests are stable, except the A1c is slightly higher again, now up to 8.0.  since this is diabetes, we can add the Ozempic for sugar and weight control as we discussed at your visit.  I hope you are ok with this, so I will send the prescription, and you should hear from the office as well.  ? ?Also the Vit D and the B12 levels were low, so Please take OTC Vitamin D3 at 2000 units per day, indefinitely, and take B12 1000 mcg per day for 6 months ? ?Staff to please inform pt - really needs to start the ozempic though he was hesitant at the visit today, as well as the Vit D and B12 please ?

## 2021-09-18 ENCOUNTER — Encounter: Payer: Self-pay | Admitting: Internal Medicine

## 2021-09-18 NOTE — Assessment & Plan Note (Signed)
Lab Results  ?Component Value Date  ? [place] 61 09/15/2021  ? ?Stable, pt to continue current low chol diet, declines statin ? ?

## 2021-09-18 NOTE — Assessment & Plan Note (Signed)
Last vitamin D ?Lab Results  ?Component Value Date  ? VD25OH 26.63 (L) 09/15/2021  ? ?Low, to increased oral replacement to 2000 u qd ? ?

## 2021-09-18 NOTE — Assessment & Plan Note (Signed)
Lab Results  ?Component Value Date  ? VITAMINB12 199 (L) 09/15/2021  ? ?Low, to start oral replacement - b12 1000 mcg qd ? ?

## 2021-09-18 NOTE — Assessment & Plan Note (Signed)
Lab Results  ?Component Value Date  ? HGBA1C 8.0 (H) 09/15/2021  ? ?uncontrolled, pt to continue current medical treatment metfomrin, januvia and d/w pt regarding ozempic and he wants to consider more before starting this ? ?

## 2021-09-18 NOTE — Assessment & Plan Note (Signed)
Age and sex appropriate education and counseling updated with regular exercise and diet ?Referrals for preventative services - for colonoscopy ?Immunizations addressed - declinees shingrix, tdap ?Smoking counseling  - none needed ?Evidence for depression or other mood disorder - none significant ?Most recent labs reviewed. ?I have personally reviewed and have noted: ?1) the patient's medical and social history ?2) The patient's current medications and supplements ?3) The patient's height, weight, and BMI have been recorded in the chart ? ?

## 2021-10-03 ENCOUNTER — Telehealth: Payer: Self-pay

## 2021-10-03 ENCOUNTER — Other Ambulatory Visit: Payer: Self-pay | Admitting: Internal Medicine

## 2021-10-03 ENCOUNTER — Other Ambulatory Visit (HOSPITAL_COMMUNITY): Payer: Self-pay

## 2021-10-03 MED ORDER — CHOLECALCIFEROL 50 MCG (2000 UT) PO TABS
1.0000 | ORAL_TABLET | Freq: Every day | ORAL | 3 refills | Status: AC
  Filled 2021-10-03: qty 90, fill #0

## 2021-10-03 MED ORDER — SITAGLIPTIN PHOSPHATE 100 MG PO TABS
100.0000 mg | ORAL_TABLET | Freq: Every day | ORAL | 3 refills | Status: DC
  Filled 2021-10-03: qty 30, 30d supply, fill #0
  Filled 2021-11-05: qty 30, 30d supply, fill #1
  Filled 2021-12-08: qty 30, 30d supply, fill #2
  Filled 2022-01-08: qty 30, 30d supply, fill #3
  Filled 2022-02-09: qty 30, 30d supply, fill #4
  Filled 2022-03-04 – 2022-03-05 (×3): qty 30, 30d supply, fill #5
  Filled 2022-04-07: qty 30, 30d supply, fill #6
  Filled 2022-05-04: qty 30, 30d supply, fill #7
  Filled 2022-06-08: qty 30, 30d supply, fill #8
  Filled 2022-07-02: qty 30, 30d supply, fill #9
  Filled 2022-08-03: qty 30, 30d supply, fill #10
  Filled 2022-09-07: qty 30, 30d supply, fill #11

## 2021-10-03 MED ORDER — VITAMIN B-12 1000 MCG PO TABS
1000.0000 ug | ORAL_TABLET | Freq: Every day | ORAL | 3 refills | Status: DC
  Filled 2021-10-03: qty 90, 90d supply, fill #0

## 2021-10-03 NOTE — Telephone Encounter (Signed)
Januvia was already sent to [facility]. Sent vitamins to pharmacy.Marland KitchenRaechel Chute ?

## 2021-10-03 NOTE — Telephone Encounter (Signed)
Pt is requesting a refill on: ?sitaGLIPtin (JANUVIA) 100 MG tablet ?Cholecalciferol 50 MCG (2000 UT) TABS ?vitamin B-12 (CYANOCOBALAMIN) 1000 MCG tablet ? ?Pharmacy: ?[facility] ? ?LOV 09/15/21 ?ROV 03/16/22 ?

## 2021-10-03 NOTE — Telephone Encounter (Signed)
Please refill as per office routine med refill policy (all routine meds to be refilled for 3 mo or monthly (per pt preference) up to one year from last visit, then month to month grace period for 3 mo, then further med refills will have to be denied) ? ?

## 2021-10-14 ENCOUNTER — Other Ambulatory Visit (HOSPITAL_COMMUNITY): Payer: Self-pay

## 2021-11-05 ENCOUNTER — Other Ambulatory Visit (HOSPITAL_COMMUNITY): Payer: Self-pay

## 2021-12-08 ENCOUNTER — Other Ambulatory Visit (HOSPITAL_COMMUNITY): Payer: Self-pay

## 2021-12-23 ENCOUNTER — Encounter: Payer: Self-pay | Admitting: Emergency Medicine

## 2021-12-23 ENCOUNTER — Other Ambulatory Visit: Payer: Self-pay

## 2021-12-23 ENCOUNTER — Other Ambulatory Visit (HOSPITAL_COMMUNITY): Payer: Self-pay

## 2021-12-23 ENCOUNTER — Ambulatory Visit
Admission: EM | Admit: 2021-12-23 | Discharge: 2021-12-23 | Disposition: A | Discharge status: Home / Self Care | Payer: No Typology Code available for payment source | Attending: Physician Assistant | Admitting: Physician Assistant

## 2021-12-23 DIAGNOSIS — L03031 Cellulitis of right toe: Secondary | ICD-10-CM | POA: Diagnosis not present

## 2021-12-23 MED ORDER — MUPIROCIN 2 % EX OINT
1.0000 | TOPICAL_OINTMENT | Freq: Two times a day (BID) | CUTANEOUS | 0 refills | Status: AC
  Filled 2021-12-23: qty 22, 7d supply, fill #0

## 2021-12-23 MED ORDER — DOXYCYCLINE HYCLATE 100 MG PO CAPS
100.0000 mg | ORAL_CAPSULE | Freq: Two times a day (BID) | ORAL | 0 refills | Status: AC
  Filled 2021-12-23: qty 14, 7d supply, fill #0

## 2021-12-23 NOTE — ED Provider Notes (Signed)
[facility]    CSN: [id number] Arrival date & time: 12/23/21  0827      History   Chief Complaint Chief Complaint  Patient presents with   Toe Pain    Right great toe    HPI [name] is a 51 y.o. male presenting for right great toe cuticle pain, swelling, redness and bleeding for the past 1 week.  He has been cleaning with hydrogen peroxide but has not gotten better.  Patient is a type II diabetic and concerned about potential infection.  He says he thinks he got dirt in the cuticle and it got infected.  He has not had any associated fevers.  He does report a lot of pain especially when walking and stepping down the gas pedal as well as trying to sleep at night.  History of ingrown toenails.  HPI  Past Medical History:  Diagnosis Date   ALLERGIC RHINITIS 12/31/2008   DIABETES MELLITUS, TYPE II 06/03/2009   Dyslipidemia 10/31/2014   FOOT PAIN, RIGHT 08/08/2010   HYPERSOMNIA 11/30/2008   Lumbar disc disease 03/01/2011   OBSTRUCTIVE SLEEP APNEA 02/03/2009   OTHER INFLAMMATORY DISORDER MALE GENITAL ORGANS 11/30/2008    Patient Active Problem List   Diagnosis Date Noted   Vitamin D deficiency 02/28/2021   Mass of left side of neck 08/27/2020   B12 deficiency 02/27/2020   RLS (restless legs syndrome) 08/22/2019   Arthritis of right acromioclavicular joint 08/29/2018   Calcific bursitis of shoulder 08/29/2018   Acute pain of right shoulder 08/23/2018   Dysuria 08/23/2018   Fatigue 03/10/2018   Abnormal TSH 03/10/2018   Heel pain 12/27/2017   Ingrown nail 05/30/2015   Skin lesion 05/30/2015   Dyslipidemia 10/31/2014   Obesity 10/31/2014   Low back pain 03/09/2013   Lumbar disc disease 03/01/2011   Encounter for well adult exam with abnormal findings 02/22/2011   Diabetes ([place]) 06/03/2009   Sleep apnea 02/03/2009   Allergic rhinitis 12/31/2008    Past Surgical History:  Procedure Laterality Date   HERNIA REPAIR  2012   [facility]       Home Medications     Prior to Admission medications   Medication Sig Start Date End Date Taking? Authorizing Provider  Cholecalciferol 50 MCG (2000 UT) TABS Take 1 tablet (2,000 Units total) by mouth daily. 10/03/21  Yes Biagio Borg, MD  doxycycline (VIBRAMYCIN) 100 MG capsule Take 1 capsule (100 mg total) by mouth 2 (two) times daily for 7 days. 12/23/21 12/30/21 Yes Danton Clap, PA-C  metFORMIN (GLUCOPHAGE-XR) 500 MG 24 hr tablet Take 3 tablets (1,500 mg total) by mouth daily with breakfast. 02/27/20  Yes Biagio Borg, MD  mupirocin ointment (BACTROBAN) 2 % Apply 1 application topically 2 (two) times daily for 7 days. 12/23/21 12/30/21 Yes Laurene Footman B, PA-C  sitaGLIPtin (JANUVIA) 100 MG tablet Take 1 tablet (100 mg total) by mouth daily. 10/03/21  Yes Biagio Borg, MD  Accu-Chek FastClix Lancets MISC Use as directed twice daily 02/28/21   Biagio Borg, MD  Alcohol Swabs (B-D SINGLE USE SWABS REGULAR) PADS Use as directed    [provider]  aspirin 81 MG tablet Take 81 mg by mouth daily.    [provider]  Blood Glucose Monitoring Suppl (FREESTYLE FREEDOM LITE) w/Device KIT Use to check blood sugars twice a day 10/03/19   Biagio Borg, MD  diclofenac (VOLTAREN) 75 MG EC tablet Take 1 tablet (75 mg total) by mouth  2 (two) times daily as needed. 08/22/19   Biagio Borg, MD  gabapentin (NEURONTIN) 300 MG capsule 1-2 tab by mouth at bedtime 08/22/19   Biagio Borg, MD  glucose blood (FREESTYLE LITE) test strip Use to check blood sugars twice a day 11/21/20   Biagio Borg, MD  glucose blood test strip USE TO CHECK BLOOD SUGARS TWICE A DAY 10/03/19   [provider]  Lancet Device MISC Use as directed twice per day E11.9 10/02/19   Biagio Borg, MD  metFORMIN (GLUCOPHAGE-XR) 500 MG 24 hr tablet Take 4 tablets (2,000 mg total) by mouth daily with breakfast. 05/08/21   Biagio Borg, MD  nitroGLYCERIN (NITRODUR - DOSED IN MG/24 HR) 0.2 mg/hr patch 1/4 patch daily 08/29/18   Lyndal Pulley, DO   NON FORMULARY Reported on 10/02/2015    [provider]  Semaglutide,0.25 or 0.5MG/DOS, (OZEMPIC, 0.25 OR 0.5 MG/DOSE,) 2 MG/1.5ML SOPN Inject 0.5 mg into the skin once a week. 09/15/21   Biagio Borg, MD  vitamin B-12 (CYANOCOBALAMIN) 1000 MCG tablet Take 1 tablet (1,000 mcg total) by mouth daily. 10/03/21   Biagio Borg, MD    Family History Family History  Problem Relation Age of Onset   Depression Mother    Diabetes Father    Hypertension Father    Heart attack Father    Coronary artery disease Father    Depression Maternal Grandmother    Cancer Other        facial skin cancers    Social History Social History   Tobacco Use   Smoking status: Never   Smokeless tobacco: Never  Vaping Use   Vaping Use: Never used  Substance Use Topics   Alcohol use: Yes    Comment: social   Drug use: No     Allergies   Penicillins   Review of Systems Review of Systems  Musculoskeletal:  Negative for arthralgias, gait problem and joint swelling.  Skin:  Positive for color change. Negative for wound.  Neurological:  Negative for weakness and numbness.     Physical Exam Triage Vital Signs ED Triage Vitals  Enc Vitals Group     BP      Pulse      Resp      Temp      Temp src      SpO2      Weight      Height      Head Circumference      Peak Flow      Pain Score      Pain Loc      Pain Edu?      Excl. in [place]?    No data found.  Updated Vital Signs BP 122/80 (BP Location: Left Arm)   Pulse 78   Temp 98.5 F (36.9 C) (Oral)   Resp 16   Ht 5' 11" (1.803 m)   Wt 208 lb 15.9 oz (94.8 kg)   SpO2 98%   BMI 29.15 kg/m      Physical Exam Vitals and nursing note reviewed.  Constitutional:      General: He is not in acute distress.    Appearance: Normal appearance. He is well-developed. He is not ill-appearing.  HENT:     Head: Normocephalic and atraumatic.  Eyes:     General: No scleral icterus.    Conjunctiva/sclera: Conjunctivae normal.   Cardiovascular:     Rate and Rhythm: Normal rate.  Pulses: Normal pulses.  Pulmonary:     Effort: Pulmonary effort is normal. No respiratory distress.     Breath sounds: Normal breath sounds.  Musculoskeletal:     Cervical back: Neck supple.  Skin:    General: Skin is warm and dry.     Capillary Refill: Capillary refill takes less than 2 seconds.     Comments: Right great toe: There is erythema of the cuticle and nail fold of the right great toe, small amount of bleeding the medial cuticle and tiny amount of pustular material.  Area is tender to palpation.  Neurological:     General: No focal deficit present.     Mental Status: He is alert. Mental status is at baseline.     Motor: No weakness.     Gait: Gait abnormal.  Psychiatric:        Mood and Affect: Mood normal.        Behavior: Behavior normal.      UC Treatments / Results  Labs (all labs ordered are listed, but only abnormal results are displayed) Labs Reviewed - No data to display  EKG   Radiology No results found.  Procedures Procedures (including critical care time)  Medications Ordered in UC Medications - No data to display  Initial Impression / Assessment and Plan / UC Course  I have reviewed the triage vital signs and the nursing notes.  Pertinent labs & imaging results that were available during my care of the patient were reviewed by me and considered in my medical decision making (see chart for details).  51 year old type II diabetic male presents with erythema, swelling, and drainage from right great toe cuticle.  Consistent with paronychia.  Could be small underlying ingrown toenail but able to see majority of the nail which does not appear to be ingrown.  We will cover at this time with doxycycline and mupirocin ointment.  Discussed wound care guidelines and advised not to use hydrogen peroxide any longer.  Advised close monitoring and follow-up if no improvement with this treatment or if  symptoms worsen.   Final Clinical Impressions(s) / UC Diagnoses   Final diagnoses:  Paronychia of toe of right foot     Discharge Instructions      -You have a minor cuticle infection and possibly a minor ingrown toenail. - Warm Epsom salt soaks a couple times a day over the next few days.  Clean only with soap and water.  Do not use any hydrogen peroxide any longer. - Use the mupirocin ointment around the cuticle.  May also put a small amount of hydrocortisone cream there are 2 to help with swelling. - I have sent oral antibiotics to treat the infection especially since you are diabetic and we do not want to worsen. - Symptoms should be improving over the next few days but if you feel that your symptoms are worsening or are not better over the next week, you should return for reevaluation.     ED Prescriptions     Medication Sig Dispense Auth. Provider   doxycycline (VIBRAMYCIN) 100 MG capsule Take 1 capsule (100 mg total) by mouth 2 (two) times daily for 7 days. 14 capsule Laurene Footman B, PA-C   mupirocin ointment (BACTROBAN) 2 % Apply 1 application topically 2 (two) times daily for 7 days. 22 g Danton Clap, PA-C      PDMP not reviewed this encounter.   Danton Clap, PA-C 12/23/21 [phone]

## 2021-12-23 NOTE — Discharge Instructions (Signed)
-  You have a minor cuticle infection and possibly a minor ingrown toenail. - Warm Epsom salt soaks a couple times a day over the next few days.  Clean only with soap and water.  Do not use any hydrogen peroxide any longer. - Use the mupirocin ointment around the cuticle.  May also put a small amount of hydrocortisone cream there are 2 to help with swelling. - I have sent oral antibiotics to treat the infection especially since you are diabetic and we do not want to worsen. - Symptoms should be improving over the next few days but if you feel that your symptoms are worsening or are not better over the next week, you should return for reevaluation.

## 2021-12-23 NOTE — ED Triage Notes (Signed)
Pt c/o right great toe pain. Started about a week ago. He states it is swollen red and draining.

## 2022-01-08 ENCOUNTER — Other Ambulatory Visit (HOSPITAL_COMMUNITY): Payer: Self-pay

## 2022-02-09 ENCOUNTER — Other Ambulatory Visit (HOSPITAL_COMMUNITY): Payer: Self-pay

## 2022-02-09 ENCOUNTER — Other Ambulatory Visit: Payer: Self-pay | Admitting: Internal Medicine

## 2022-02-09 NOTE — Telephone Encounter (Signed)
Please refill as per office routine med refill policy (all routine meds to be refilled for 3 mo or monthly (per pt preference) up to one year from last visit, then month to month grace period for 3 mo, then further med refills will have to be denied) ? ?

## 2022-02-10 ENCOUNTER — Other Ambulatory Visit (HOSPITAL_COMMUNITY): Payer: Self-pay

## 2022-02-10 MED ORDER — METFORMIN HCL ER 500 MG PO TB24
2000.0000 mg | ORAL_TABLET | Freq: Every day | ORAL | 2 refills | Status: DC
  Filled 2022-02-10: qty 360, 90d supply, fill #0
  Filled 2022-05-04: qty 360, 90d supply, fill #1
  Filled 2022-08-03: qty 360, 90d supply, fill #2

## 2022-03-04 ENCOUNTER — Other Ambulatory Visit (HOSPITAL_BASED_OUTPATIENT_CLINIC_OR_DEPARTMENT_OTHER): Payer: Self-pay

## 2022-03-05 ENCOUNTER — Other Ambulatory Visit (HOSPITAL_BASED_OUTPATIENT_CLINIC_OR_DEPARTMENT_OTHER): Payer: Self-pay

## 2022-03-05 ENCOUNTER — Other Ambulatory Visit (HOSPITAL_COMMUNITY): Payer: Self-pay

## 2022-03-15 ENCOUNTER — Encounter: Payer: Self-pay | Admitting: Internal Medicine

## 2022-03-16 ENCOUNTER — Ambulatory Visit: Payer: No Typology Code available for payment source | Admitting: Internal Medicine

## 2022-03-18 ENCOUNTER — Encounter: Payer: Self-pay | Admitting: Internal Medicine

## 2022-03-18 ENCOUNTER — Encounter: Payer: Self-pay | Admitting: Emergency Medicine

## 2022-03-18 ENCOUNTER — Ambulatory Visit (INDEPENDENT_AMBULATORY_CARE_PROVIDER_SITE_OTHER): Payer: No Typology Code available for payment source | Admitting: Emergency Medicine

## 2022-03-18 VITALS — BP 124/80 | HR 95 | Temp 97.9°F | Ht 71.0 in | Wt 201.4 lb

## 2022-03-18 DIAGNOSIS — U071 COVID-19: Secondary | ICD-10-CM | POA: Insufficient documentation

## 2022-03-18 DIAGNOSIS — E1165 Type 2 diabetes mellitus with hyperglycemia: Secondary | ICD-10-CM

## 2022-03-18 LAB — COMPREHENSIVE METABOLIC PANEL
ALT: 23 U/L (ref 0–53)
AST: 14 U/L (ref 0–37)
Albumin: 4.2 g/dL (ref 3.5–5.2)
Alkaline Phosphatase: 67 U/L (ref 39–117)
BUN: 15 mg/dL (ref 6–23)
CO2: 29 mEq/L (ref 19–32)
Calcium: 9.8 mg/dL (ref 8.4–10.5)
Chloride: 100 mEq/L (ref 96–112)
Creatinine, Ser: 1.05 mg/dL (ref 0.40–1.50)
GFR: 82.07 mL/min (ref >60.00)
Glucose, Bld: 282 mg/dL — ABNORMAL HIGH (ref 70–99)
Potassium: 4.1 mEq/L (ref 3.5–5.1)
Sodium: 138 mEq/L (ref 135–145)
Total Bilirubin: 0.5 mg/dL (ref 0.2–1.2)
Total Protein: 7.1 g/dL (ref 6.0–8.3)

## 2022-03-18 LAB — CBC WITH DIFFERENTIAL/PLATELET
Basophils Absolute: 0 10*3/uL (ref 0.0–0.1)
Basophils Relative: 0.5 % (ref 0.0–3.0)
Eosinophils Absolute: 0.3 10*3/uL (ref 0.0–0.7)
Eosinophils Relative: 3.1 % (ref 0.0–5.0)
HCT: 48.2 % (ref 39.0–52.0)
Hemoglobin: 16.3 g/dL (ref 13.0–17.0)
Lymphocytes Relative: 21.7 % (ref 12.0–46.0)
Lymphs Abs: 1.8 10*3/uL (ref 0.7–4.0)
MCHC: 33.8 g/dL (ref 30.0–36.0)
MCV: 89.9 fl (ref 78.0–100.0)
Monocytes Absolute: 0.6 10*3/uL (ref 0.1–1.0)
Monocytes Relative: 7.1 % (ref 3.0–12.0)
Neutro Abs: 5.7 10*3/uL (ref 1.4–7.7)
Neutrophils Relative %: 67.6 % (ref 43.0–77.0)
Platelets: 221 10*3/uL (ref 150.0–400.0)
RBC: 5.37 Mil/uL (ref 4.22–5.81)
RDW: 13.4 % (ref 11.5–15.5)
WBC: 8.4 10*3/uL (ref 4.0–10.5)

## 2022-03-18 NOTE — Assessment & Plan Note (Signed)
Stable.  No red flag signs or symptoms. Slowly getting better.  No complications. Outside window of antiviral treatment. Advised to stay home for the rest of the week. Advised to stay well-hydrated. ED precautions given. Advised to contact the office if no better or worse during the next several days.

## 2022-03-18 NOTE — Progress Notes (Signed)
[name] 51 y.o.   Chief Complaint  Patient presents with   fatigued    Patient doesn't have a lot of energy, congestion, Covid positive 10/22    HISTORY OF PRESENT ILLNESS: Acute problem visit today.  Patient of Dr. Cathlean Cower This is a 51 y.o. male recently returned from vacation at [place] in [place].  Developed flulike symptoms 5 days ago.  Tested positive for COVID. Diabetic.  Complaining of lack of energy and congestion. Second time he gets COVID.  Did receive vaccinations prior to that. No other associated symptoms. No other plaints or medical concerns today.  HPI   Prior to Admission medications   Medication Sig Start Date End Date Taking? Authorizing Provider  Accu-Chek FastClix Lancets MISC Use as directed twice daily 02/28/21  Yes Biagio Borg, MD  Alcohol Swabs (B-D SINGLE USE SWABS REGULAR) PADS Use as directed   Yes [provider]  aspirin 81 MG tablet Take 81 mg by mouth daily.   Yes [provider]  Blood Glucose Monitoring Suppl (FREESTYLE FREEDOM LITE) w/Device KIT Use to check blood sugars twice a day 10/03/19  Yes Biagio Borg, MD  Cholecalciferol 50 MCG (2000 UT) TABS Take 1 tablet (2,000 Units total) by mouth daily. 10/03/21  Yes Biagio Borg, MD  diclofenac (VOLTAREN) 75 MG EC tablet Take 1 tablet (75 mg total) by mouth 2 (two) times daily as needed. 08/22/19  Yes Biagio Borg, MD  gabapentin (NEURONTIN) 300 MG capsule 1-2 tab by mouth at bedtime 08/22/19  Yes Biagio Borg, MD  glucose blood (FREESTYLE LITE) test strip Use to check blood sugars twice a day 11/21/20  Yes Biagio Borg, MD  glucose blood test strip USE TO CHECK BLOOD SUGARS TWICE A DAY 10/03/19  Yes [provider]  Lancet Device MISC Use as directed twice per day E11.9 10/02/19  Yes Biagio Borg, MD  metFORMIN (GLUCOPHAGE-XR) 500 MG 24 hr tablet Take 3 tablets (1,500 mg total) by mouth daily with breakfast. 02/27/20  Yes Biagio Borg, MD  metFORMIN (GLUCOPHAGE-XR)  500 MG 24 hr tablet Take 4 tablets (2,000 mg total) by mouth daily with breakfast. 02/10/22  Yes Biagio Borg, MD  nitroGLYCERIN (NITRODUR - DOSED IN MG/24 HR) 0.2 mg/hr patch 1/4 patch daily 08/29/18  Yes Lyndal Pulley, DO  NON FORMULARY Reported on 10/02/2015   Yes [provider]  Semaglutide,0.25 or 0.5MG/DOS, (OZEMPIC, 0.25 OR 0.5 MG/DOSE,) 2 MG/1.5ML SOPN Inject 0.5 mg into the skin once a week. 09/15/21  Yes Biagio Borg, MD  sitaGLIPtin (JANUVIA) 100 MG tablet Take 1 tablet (100 mg total) by mouth daily. 10/03/21  Yes Biagio Borg, MD  vitamin B-12 (CYANOCOBALAMIN) 1000 MCG tablet Take 1 tablet (1,000 mcg total) by mouth daily. 10/03/21  Yes Biagio Borg, MD    Allergies  Allergen Reactions   Penicillins     Childhood allergy    Patient Active Problem List   Diagnosis Date Noted   Vitamin D deficiency 02/28/2021   Mass of left side of neck 08/27/2020   B12 deficiency 02/27/2020   RLS (restless legs syndrome) 08/22/2019   Arthritis of right acromioclavicular joint 08/29/2018   Calcific bursitis of shoulder 08/29/2018   Acute pain of right shoulder 08/23/2018   Dysuria 08/23/2018   Fatigue 03/10/2018   Abnormal TSH 03/10/2018   Heel pain 12/27/2017   Ingrown nail 05/30/2015   Skin lesion 05/30/2015   Dyslipidemia 10/31/2014  Obesity 10/31/2014   Low back pain 03/09/2013   Lumbar disc disease 03/01/2011   Encounter for well adult exam with abnormal findings 02/22/2011   Diabetes ([place]) 06/03/2009   Sleep apnea 02/03/2009   Allergic rhinitis 12/31/2008    Past Medical History:  Diagnosis Date   ALLERGIC RHINITIS 12/31/2008   DIABETES MELLITUS, TYPE II 06/03/2009   Dyslipidemia 10/31/2014   FOOT PAIN, RIGHT 08/08/2010   HYPERSOMNIA 11/30/2008   Lumbar disc disease 03/01/2011   OBSTRUCTIVE SLEEP APNEA 02/03/2009   OTHER INFLAMMATORY DISORDER MALE GENITAL ORGANS 11/30/2008    Past Surgical History:  Procedure Laterality Date   HERNIA REPAIR  2012   [facility]     Social History   Socioeconomic History   Marital status: Married    Spouse name: Not on file   Number of children: 2   Years of education: Not on file   Highest education level: Not on file  Occupational History   Occupation: sales  [employer]    Employer: [employer]  Tobacco Use   Smoking status: Never   Smokeless tobacco: Never  Vaping Use   Vaping Use: Never used  Substance and Sexual Activity   Alcohol use: Yes    Comment: social   Drug use: No   Sexual activity: Not on file  Other Topics Concern   Not on file  Social History Narrative   Not on file   Social Determinants of Health   Financial Resource Strain: Not on file  Food Insecurity: Not on file  Transportation Needs: Not on file  Physical Activity: Not on file  Stress: Not on file  Social Connections: Not on file  Intimate Partner Violence: Not on file    Family History  Problem Relation Age of Onset   Depression Mother    Diabetes Father    Hypertension Father    Heart attack Father    Coronary artery disease Father    Depression Maternal Grandmother    Cancer Other        facial skin cancers     Review of Systems  Constitutional:  Positive for malaise/fatigue. Negative for chills and fever.  HENT:  Positive for congestion. Negative for sore throat.   Respiratory: Negative.  Negative for cough and shortness of breath.   Cardiovascular: Negative.  Negative for chest pain and palpitations.  Gastrointestinal: Negative.  Negative for abdominal pain, diarrhea, nausea and vomiting.  Genitourinary: Negative.  Negative for dysuria.  Skin: Negative.   Neurological: Negative.  Negative for dizziness and headaches.  All other systems reviewed and are negative.  Today's Vitals   03/18/22 1332  BP: 124/80  Pulse: 95  Temp: 97.9 F (36.6 C)  TempSrc: Oral  SpO2: 100%  Weight: 201 lb 6 oz (91.3 kg)  Height: '5\' 11"'  (1.803 m)   Body mass index is 28.09 kg/m.    Physical  Exam Vitals reviewed.  Constitutional:      Appearance: Normal appearance.  HENT:     Head: Normocephalic.     Mouth/Throat:     Mouth: Mucous membranes are moist.     Pharynx: Oropharynx is clear.  Eyes:     Extraocular Movements: Extraocular movements intact.     Conjunctiva/sclera: Conjunctivae normal.     Pupils: Pupils are equal, round, and reactive to light.  Cardiovascular:     Rate and Rhythm: Normal rate and regular rhythm.     Pulses: Normal pulses.     Heart sounds: Normal  heart sounds.  Pulmonary:     Effort: Pulmonary effort is normal.     Breath sounds: Normal breath sounds.  Musculoskeletal:        General: Normal range of motion.     Cervical back: No tenderness.  Lymphadenopathy:     Cervical: No cervical adenopathy.  Skin:    General: Skin is warm and dry.  Neurological:     General: No focal deficit present.     Mental Status: He is alert and oriented to person, place, and time.  Psychiatric:        Mood and Affect: Mood normal.        Behavior: Behavior normal.      ASSESSMENT & PLAN: A total of 33 minutes was spent with the patient and counseling/coordination of care regarding preparing for this visit, review of most recent office visit notes, review of multiple chronic medical problems and their management, review of all medications, diagnosis of COVID infection and management, ED precautions, prognosis, documentation and need for follow-up if no better or worse during the next several days.  Problem List Items Addressed This Visit       Endocrine   Diabetes ([place])   Relevant Orders   Comprehensive metabolic panel   CBC with Differential/Platelet     Other   COVID-19 virus infection - Primary    Stable.  No red flag signs or symptoms. Slowly getting better.  No complications. Outside window of antiviral treatment. Advised to stay home for the rest of the week. Advised to stay well-hydrated. ED precautions given. Advised to contact the  office if no better or worse during the next several days.      Relevant Orders   Comprehensive metabolic panel   CBC with Differential/Platelet   Patient Instructions  [id number] COVID-19, or coronavirus disease 2019, is an infection that is caused by a new (novel) coronavirus called SARS-CoV-2. COVID-19 can cause many symptoms. In some people, the virus may not cause any symptoms. In others, it may cause mild or severe symptoms. Some people with severe infection develop severe disease. What are the causes? This illness is caused by a virus. The virus may be in the air as tiny specks of fluid (aerosols) or droplets, or it may be on surfaces. You may catch the virus by: Breathing in droplets from an infected person. Droplets can be spread by a person breathing, speaking, singing, coughing, or sneezing. Touching something, like a table or a doorknob, that has virus on it (is contaminated) and then touching your mouth, nose, or eyes. What increases the risk? Risk for infection: You are more likely to get infected with the COVID-19 virus if: You are within 6 ft (1.8 m) of a person with COVID-19 for 15 minutes or longer. You are providing care for a person who is infected with COVID-19. You are in close personal contact with other people. Close personal contact includes hugging, kissing, or sharing eating or drinking utensils. Risk for serious illness caused by COVID-19: You are more likely to get seriously ill from the COVID-19 virus if: You have cancer. You have a long-term (chronic) disease, such as: Chronic lung disease. This includes pulmonary embolism, chronic obstructive pulmonary disease, and cystic fibrosis. Long-term disease that lowers your body's ability to fight infection (immunocompromise). Serious cardiac conditions, such as heart failure, coronary artery disease, or cardiomyopathy. Diabetes. Chronic kidney disease. Liver diseases. These include cirrhosis, nonalcoholic fatty  liver disease, alcoholic liver disease, or autoimmune hepatitis. You have obesity.  You are pregnant or were recently pregnant. You have sickle cell disease. What are the signs or symptoms? Symptoms of this condition can range from mild to severe. Symptoms may appear any time from 2 to 14 days after being exposed to the virus. They include: Fever or chills. Shortness of breath or trouble breathing. Feeling tired or very tired. Headaches, body aches, or muscle aches. Runny or stuffy nose, sneezing, coughing, or sore throat. New loss of taste or smell. This is rare. Some people may also have stomach problems, such as nausea, vomiting, or diarrhea. Other people may not have any symptoms of COVID-19. How is this diagnosed? This condition may be diagnosed by testing samples to check for the COVID-19 virus. The most common tests are the PCR test and the antigen test. Tests may be done in the lab or at home. They include: Using a swab to take a sample of fluid from the back of your nose and throat (nasopharyngeal fluid), from your nose, or from your throat. Testing a sample of saliva from your mouth. Testing a sample of coughed-up mucus from your lungs (sputum). How is this treated? Treatment for COVID-19 infection depends on the severity of the condition. Mild symptoms can be managed at home with rest, fluids, and over-the-counter medicines. Serious symptoms may be treated in a hospital intensive care unit (ICU). Treatment in the ICU may include: Supplemental oxygen. Extra oxygen is given through a tube in the nose, a face mask, or a hood. Medicines. These may include: Antivirals, such as monoclonal antibodies. These help your body fight off certain viruses that can cause disease. Anti-inflammatories, such as corticosteroids. These reduce inflammation and suppress the immune system. Antithrombotics. These prevent or treat blood clots, if they develop. Convalescent plasma. This helps boost your  immune system, if you have an underlying immunosuppressive condition or are getting immunosuppressive treatments. Prone positioning. This means you will lie on your stomach. This helps oxygen to get into your lungs. Infection control measures. If you are at risk for more serious illness caused by COVID-19, your health care provider may prescribe two long-acting monoclonal antibodies, given together every 6 months. How is this prevented? To protect yourself: Use preventive medicine (pre-exposure prophylaxis). You may get pre-exposure prophylaxis if you have moderate or severe immunocompromise. Get vaccinated. Anyone 96 months old or older who meets guidelines can get a COVID-19 vaccine or vaccine series. This includes people who are pregnant or making breast milk (lactating). Get an added dose of COVID-19 vaccine after your first vaccine or vaccine series if you have moderate to severe immunocompromise. This applies if you have had a solid organ transplant or have been diagnosed with an immunocompromising condition. You should get the added dose 4 weeks after you got the first COVID-19 vaccine or vaccine series. If you get an mRNA vaccine, you will need a 3-dose primary series. If you get the J&J/Janssen vaccine, you will need a 2-dose primary series, with the second dose being an mRNA vaccine. Talk to your health care provider about getting experimental monoclonal antibodies. This treatment is approved under emergency use authorization to prevent severe illness before or after being exposed to the COVID-19 virus. You may be given monoclonal antibodies if: You have moderate or severe immunocompromise. This includes treatments that lower your immune response. People with immunocompromise may not develop protection against COVID-19 when they are vaccinated. You cannot be vaccinated. You may not get a vaccine if you have a severe allergic reaction to the vaccine or  its components. You are not fully  vaccinated. You are in a facility where COVID-19 is present and: Are in close contact with a person who is infected with the COVID-19 virus. Are at high risk of being exposed to the COVID-19 virus. You are at risk of illness from new variants of the COVID-19 virus. To protect others: If you have symptoms of COVID-19, take steps to prevent the virus from spreading to others. Stay home. Leave your house only to get medical care. Do not use public transit, if possible. Do not travel while you are sick. Wash your hands often with soap and water for at least 20 seconds. If soap and water are not available, use alcohol-based hand sanitizer. Make sure that all people in your household wash their hands well and often. Cough or sneeze into a tissue or your sleeve or elbow. Do not cough or sneeze into your hand or into the air. Where to find more information Centers for Disease Control and Prevention: CharmCourses.be World Health Organization: https://www.castaneda.info/ Get help right away if: You have trouble breathing. You have pain or pressure in your chest. You are confused. You have bluish lips and fingernails. You have trouble waking from sleep. You have symptoms that get worse. These symptoms may be an emergency. Get help right away. Call 911. Do not wait to see if the symptoms will go away. Do not drive yourself to the hospital. Summary COVID-19 is an infection that is caused by a new coronavirus. Sometimes, there are no symptoms. Other times, symptoms range from mild to severe. Some people with a severe COVID-19 infection develop severe disease. The virus that causes COVID-19 can spread from person to person through droplets or aerosols from breathing, speaking, singing, coughing, or sneezing. Mild symptoms of COVID-19 can be managed at home with rest, fluids, and over-the-counter medicines. This information is not intended to replace advice given to you by your health  care provider. Make sure you discuss any questions you have with your health care provider. Document Revised: 04/29/2021 Document Reviewed: 05/01/2021 Elsevier Patient Education  [place], MD [facility]

## 2022-03-18 NOTE — Patient Instructions (Signed)

## 2022-03-27 ENCOUNTER — Ambulatory Visit (INDEPENDENT_AMBULATORY_CARE_PROVIDER_SITE_OTHER): Payer: No Typology Code available for payment source | Admitting: Internal Medicine

## 2022-03-27 ENCOUNTER — Other Ambulatory Visit (HOSPITAL_COMMUNITY): Payer: Self-pay

## 2022-03-27 VITALS — BP 118/72 | HR 84 | Temp 98.3°F | Ht 71.0 in | Wt 209.0 lb

## 2022-03-27 DIAGNOSIS — E559 Vitamin D deficiency, unspecified: Secondary | ICD-10-CM

## 2022-03-27 DIAGNOSIS — E538 Deficiency of other specified B group vitamins: Secondary | ICD-10-CM | POA: Diagnosis not present

## 2022-03-27 DIAGNOSIS — E1165 Type 2 diabetes mellitus with hyperglycemia: Secondary | ICD-10-CM | POA: Diagnosis not present

## 2022-03-27 DIAGNOSIS — E785 Hyperlipidemia, unspecified: Secondary | ICD-10-CM

## 2022-03-27 DIAGNOSIS — Z125 Encounter for screening for malignant neoplasm of prostate: Secondary | ICD-10-CM

## 2022-03-27 MED ORDER — OZEMPIC (0.25 OR 0.5 MG/DOSE) 2 MG/3ML ~~LOC~~ SOPN
0.2500 mg | PEN_INJECTOR | SUBCUTANEOUS | 3 refills | Status: DC
  Filled 2022-03-27: qty 3, 28d supply, fill #0
  Filled 2022-05-04: qty 3, 28d supply, fill #1
  Filled 2022-06-08 – 2022-06-18 (×2): qty 3, 28d supply, fill #2

## 2022-03-27 NOTE — Assessment & Plan Note (Signed)
Last vitamin D Lab Results  Component Value Date   VD25OH 26.63 (L) 09/15/2021   Low, reminded to start oral replacement

## 2022-03-27 NOTE — Assessment & Plan Note (Signed)
Lab Results  Component Value Date   VITAMINB12 199 (L) 09/15/2021   Low, reminded to start oral replacement - b12 1000 mcg qd

## 2022-03-27 NOTE — Patient Instructions (Signed)
Please take all new medication as prescribed- the ozempic  Please continue all other medications as before, and refills have been done if requested.  Please have the pharmacy call with any other refills you may need.  Please continue your efforts at being more active, low cholesterol diet, and weight control.  Please keep your appointments with your specialists as you may have planned  Please make an Appointment to return in 6 months, or sooner if needed, also with Lab Appointment for testing done 3-5 days before at the [facility] (so this is for TWO appointments - please see the scheduling desk as you leave)

## 2022-03-27 NOTE — Progress Notes (Unsigned)
Patient ID: [name], male   DOB: Jan 26, 1971, 51 y.o.   MRN: [id number]        Chief Complaint: follow up HTN, HLD and hyperglycemia , low b12 and low Vit D       HPI:  AULTON [name] is a 51 y.o. male here with c/o S/p recent covid infection on vacation starting oct 20- 25, still has some cough sometimes pretty hard, Pt denies chest pain, increased sob or doe, wheezing, orthopnea, PND, increased LE swelling, palpitations, dizziness or syncope.  Denies worsening reflux, abd pain, dysphagia, n/v, bowel change or blood.  Wt up here with heavy boots today, usually about 204 at home.  Did not start the ozempic as he became wary of taking a shot when got home., but willing to start.  Not taking B12 or Vit d.     Wt Readings from Last 3 Encounters:  03/27/22 209 lb (94.8 kg)  03/18/22 201 lb 6 oz (91.3 kg)  12/23/21 208 lb 15.9 oz (94.8 kg)   BP Readings from Last 3 Encounters:  03/27/22 118/72  03/18/22 124/80  12/23/21 122/80         Past Medical History:  Diagnosis Date   ALLERGIC RHINITIS 12/31/2008   DIABETES MELLITUS, TYPE II 06/03/2009   Dyslipidemia 10/31/2014   FOOT PAIN, RIGHT 08/08/2010   HYPERSOMNIA 11/30/2008   Lumbar disc disease 03/01/2011   OBSTRUCTIVE SLEEP APNEA 02/03/2009   OTHER INFLAMMATORY DISORDER MALE GENITAL ORGANS 11/30/2008   Past Surgical History:  Procedure Laterality Date   HERNIA REPAIR  2012   [facility]    reports that he has never smoked. He has never used smokeless tobacco. He reports current alcohol use. He reports that he does not use drugs. family history includes Cancer in an other family member; Coronary artery disease in his father; Depression in his maternal grandmother and mother; Diabetes in his father; Heart attack in his father; Hypertension in his father. Allergies  Allergen Reactions   Penicillins     Childhood allergy   Current Outpatient Medications on File Prior to Visit  Medication Sig Dispense Refill   Accu-Chek FastClix Lancets MISC Use as  directed twice daily 102 each 11   Alcohol Swabs (B-D SINGLE USE SWABS REGULAR) PADS Use as directed     aspirin 81 MG tablet Take 81 mg by mouth daily.     Blood Glucose Monitoring Suppl (FREESTYLE FREEDOM LITE) w/Device KIT Use to check blood sugars twice a day 1 kit 0   Cholecalciferol 50 MCG (2000 UT) TABS Take 1 tablet (2,000 Units total) by mouth daily. 90 tablet 3   diclofenac (VOLTAREN) 75 MG EC tablet Take 1 tablet (75 mg total) by mouth 2 (two) times daily as needed. 60 tablet 5   gabapentin (NEURONTIN) 300 MG capsule 1-2 tab by mouth at bedtime 90 capsule 3   glucose blood (FREESTYLE LITE) test strip Use to check blood sugars twice a day 180 each 3   glucose blood test strip USE TO CHECK BLOOD SUGARS TWICE A DAY     Lancet Device MISC Use as directed twice per day E11.9 200 each 11   metFORMIN (GLUCOPHAGE-XR) 500 MG 24 hr tablet Take 4 tablets (2,000 mg total) by mouth daily with breakfast. 360 tablet 2   nitroGLYCERIN (NITRODUR - DOSED IN MG/24 HR) 0.2 mg/hr patch 1/4 patch daily 30 patch 1   NON FORMULARY Reported on 10/02/2015     sitaGLIPtin (JANUVIA) 100 MG tablet Take 1  tablet (100 mg total) by mouth daily. 90 tablet 3   vitamin B-12 (CYANOCOBALAMIN) 1000 MCG tablet Take 1 tablet (1,000 mcg total) by mouth daily. 90 tablet 3   No current facility-administered medications on file prior to visit.        ROS:  All others reviewed and negative.  Objective        PE:  BP 118/72 (BP Location: Right Arm, Patient Position: Sitting, Cuff Size: Large)   Pulse 84   Temp 98.3 F (36.8 C) (Oral)   Ht _0  (1.803 m)   Wt 209 lb (94.8 kg)   SpO2 96%   BMI 29.15 kg/m                 Constitutional: Pt appears in NAD               HENT: Head: NCAT.                Right Ear: External ear normal.                 Left Ear: External ear normal.                Eyes: . Pupils are equal, round, and reactive to light. Conjunctivae and EOM are normal               Nose: without d/c or  deformity               Neck: Neck supple. Gross normal ROM               Cardiovascular: Normal rate and regular rhythm.                 Pulmonary/Chest: Effort normal and breath sounds without rales or wheezing.                Abd:  Soft, NT, ND, + BS, no organomegaly               Neurological: Pt is alert. At baseline orientation, motor grossly intact               Skin: Skin is warm. No rashes, no other new lesions, LE edema - none               Psychiatric: Pt behavior is normal without agitation   Micro: none  Cardiac tracings I have personally interpreted today:  none  Pertinent Radiological findings (summarize): none   Lab Results  Component Value Date   WBC 8.4 03/18/2022   HGB 16.3 03/18/2022   HCT 48.2 03/18/2022   PLT 221.0 03/18/2022   GLUCOSE 282 (H) 03/18/2022   CHOL 129 09/15/2021   TRIG 131.0 09/15/2021   HDL 42.50 09/15/2021   LDLDIRECT 70.0 03/05/2017   LDLCALC 61 09/15/2021   ALT 23 03/18/2022   AST 14 03/18/2022   NA 138 03/18/2022   K 4.1 03/18/2022   CL 100 03/18/2022   CREATININE 1.05 03/18/2022   BUN 15 03/18/2022   CO2 29 03/18/2022   TSH 3.04 09/15/2021   PSA 0.50 09/15/2021   HGBA1C 8.0 (H) 09/15/2021   MICROALBUR <0.7 09/15/2021   Assessment/Plan:  [name] is a 51 y.o. White or Caucasian [1] male with  has a past medical history of ALLERGIC RHINITIS (12/31/2008), DIABETES MELLITUS, TYPE II (06/03/2009), Dyslipidemia (10/31/2014), FOOT PAIN, RIGHT (08/08/2010), HYPERSOMNIA (11/30/2008), Lumbar disc disease (03/01/2011), OBSTRUCTIVE SLEEP APNEA (02/03/2009), and OTHER INFLAMMATORY DISORDER MALE GENITAL ORGANS (  11/30/2008).  Vitamin D deficiency Last vitamin D Lab Results  Component Value Date   VD25OH 26.63 (L) 09/15/2021   Low, reminded to start oral replacement   B12 deficiency Lab Results  Component Value Date   VITAMINB12 199 (L) 09/15/2021   Low, reminded to start oral replacement - b12 1000 mcg qd   Dyslipidemia Lab Results   Component Value Date   LDLCALC 61 09/15/2021   Stable, pt to continue current low chol diet, dcliens statin   Diabetes Lab Results  Component Value Date   HGBA1C 8.0 (H) 09/15/2021   unocntrolled, pt to continue current medical treatment metformin ER 500 mg - 4 qd, januvia 100 mg qd, but add ozempic 0.25 mg weekly  Followup: Return in about 6 months (around 09/25/2022).  Cathlean Cower, MD 03/29/2022 7:54 PM [facility]

## 2022-03-29 ENCOUNTER — Encounter: Payer: Self-pay | Admitting: Internal Medicine

## 2022-03-29 NOTE — Assessment & Plan Note (Signed)
Lab Results  Component Value Date   HGBA1C 8.0 (H) 09/15/2021   unocntrolled, pt to continue current medical treatment metformin ER 500 mg - 4 qd, januvia 100 mg qd, but add ozempic 0.25 mg weekly

## 2022-03-29 NOTE — Assessment & Plan Note (Signed)
Lab Results  Component Value Date   LDLCALC 61 09/15/2021   Stable, pt to continue current low chol diet, dcliens statin

## 2022-04-07 ENCOUNTER — Other Ambulatory Visit: Payer: Self-pay | Admitting: Internal Medicine

## 2022-04-07 ENCOUNTER — Other Ambulatory Visit (HOSPITAL_COMMUNITY): Payer: Self-pay

## 2022-04-07 MED ORDER — ACCU-CHEK FASTCLIX LANCETS MISC
Freq: Two times a day (BID) | 11 refills | Status: AC
  Filled 2022-04-07 (×2): qty 102, 51d supply, fill #0
  Filled 2022-09-29: qty 102, 51d supply, fill #1
  Filled 2023-03-08: qty 102, 51d supply, fill #2

## 2022-04-07 MED ORDER — FREESTYLE LITE TEST VI STRP
ORAL_STRIP | Freq: Two times a day (BID) | 3 refills | Status: AC
  Filled 2022-04-07: qty 150, 75d supply, fill #0
  Filled 2022-09-29: qty 100, 50d supply, fill #1
  Filled 2022-12-03: qty 100, 50d supply, fill #2
  Filled 2023-03-08: qty 100, 50d supply, fill #3

## 2022-04-08 ENCOUNTER — Other Ambulatory Visit (HOSPITAL_COMMUNITY): Payer: Self-pay

## 2022-05-04 ENCOUNTER — Encounter: Payer: Self-pay | Admitting: Internal Medicine

## 2022-06-09 ENCOUNTER — Other Ambulatory Visit (HOSPITAL_COMMUNITY): Payer: Self-pay

## 2022-06-17 ENCOUNTER — Other Ambulatory Visit: Payer: Self-pay

## 2022-06-18 ENCOUNTER — Other Ambulatory Visit (HOSPITAL_COMMUNITY): Payer: Self-pay

## 2022-06-26 ENCOUNTER — Other Ambulatory Visit (HOSPITAL_COMMUNITY): Payer: Self-pay

## 2022-07-02 ENCOUNTER — Other Ambulatory Visit (HOSPITAL_COMMUNITY): Payer: Self-pay

## 2022-07-03 ENCOUNTER — Telehealth: Payer: Self-pay

## 2022-07-03 ENCOUNTER — Other Ambulatory Visit (HOSPITAL_COMMUNITY): Payer: Self-pay

## 2022-07-03 NOTE — Telephone Encounter (Signed)
Patient Advocate Encounter  Prior Authorization for Ozempic has been approved.    PA# [id number] Effective dates: 06/30/22 through 06/30/23  Approval letter attached to chart

## 2022-09-07 ENCOUNTER — Other Ambulatory Visit (HOSPITAL_COMMUNITY): Payer: Self-pay

## 2022-09-09 ENCOUNTER — Encounter: Payer: Self-pay | Admitting: Internal Medicine

## 2022-09-14 ENCOUNTER — Other Ambulatory Visit (INDEPENDENT_AMBULATORY_CARE_PROVIDER_SITE_OTHER): Payer: 59

## 2022-09-14 DIAGNOSIS — E559 Vitamin D deficiency, unspecified: Secondary | ICD-10-CM

## 2022-09-14 DIAGNOSIS — E785 Hyperlipidemia, unspecified: Secondary | ICD-10-CM

## 2022-09-14 DIAGNOSIS — E1165 Type 2 diabetes mellitus with hyperglycemia: Secondary | ICD-10-CM

## 2022-09-14 DIAGNOSIS — Z125 Encounter for screening for malignant neoplasm of prostate: Secondary | ICD-10-CM | POA: Diagnosis not present

## 2022-09-14 DIAGNOSIS — E538 Deficiency of other specified B group vitamins: Secondary | ICD-10-CM | POA: Diagnosis not present

## 2022-09-14 LAB — URINALYSIS, ROUTINE W REFLEX MICROSCOPIC
Bilirubin Urine: NEGATIVE
Ketones, ur: NEGATIVE
Leukocytes,Ua: NEGATIVE
Nitrite: NEGATIVE
Specific Gravity, Urine: >=1.03 — AB (ref 1.000–1.030)
Total Protein, Urine: NEGATIVE
Urine Glucose: >=1000 — AB
Urobilinogen, UA: 0.2 (ref 0.0–1.0)
pH: 6 (ref 5.0–8.0)

## 2022-09-14 LAB — BASIC METABOLIC PANEL
BUN: 13 mg/dL (ref 6–23)
CO2: 27 mEq/L (ref 19–32)
Calcium: 9.4 mg/dL (ref 8.4–10.5)
Chloride: 103 mEq/L (ref 96–112)
Creatinine, Ser: 1.05 mg/dL (ref 0.40–1.50)
GFR: 81.79 mL/min (ref >60.00)
Glucose, Bld: 278 mg/dL — ABNORMAL HIGH (ref 70–99)
Potassium: 4.3 mEq/L (ref 3.5–5.1)
Sodium: 139 mEq/L (ref 135–145)

## 2022-09-14 LAB — HEPATIC FUNCTION PANEL
ALT: 19 U/L (ref 0–53)
AST: 15 U/L (ref 0–37)
Albumin: 4.4 g/dL (ref 3.5–5.2)
Alkaline Phosphatase: 78 U/L (ref 39–117)
Bilirubin, Direct: 0.2 mg/dL (ref 0.0–0.3)
Total Bilirubin: 0.7 mg/dL (ref 0.2–1.2)
Total Protein: 6.5 g/dL (ref 6.0–8.3)

## 2022-09-14 LAB — CBC WITH DIFFERENTIAL/PLATELET
Basophils Absolute: 0 10*3/uL (ref 0.0–0.1)
Basophils Relative: 0.5 % (ref 0.0–3.0)
Eosinophils Absolute: 0.2 10*3/uL (ref 0.0–0.7)
Eosinophils Relative: 2.1 % (ref 0.0–5.0)
HCT: 48.4 % (ref 39.0–52.0)
Hemoglobin: 16.7 g/dL (ref 13.0–17.0)
Lymphocytes Relative: 26.5 % (ref 12.0–46.0)
Lymphs Abs: 1.9 10*3/uL (ref 0.7–4.0)
MCHC: 34.4 g/dL (ref 30.0–36.0)
MCV: 90.6 fl (ref 78.0–100.0)
Monocytes Absolute: 0.5 10*3/uL (ref 0.1–1.0)
Monocytes Relative: 7.2 % (ref 3.0–12.0)
Neutro Abs: 4.6 10*3/uL (ref 1.4–7.7)
Neutrophils Relative %: 63.7 % (ref 43.0–77.0)
Platelets: 206 10*3/uL (ref 150.0–400.0)
RBC: 5.34 Mil/uL (ref 4.22–5.81)
RDW: 13.7 % (ref 11.5–15.5)
WBC: 7.3 10*3/uL (ref 4.0–10.5)

## 2022-09-14 LAB — MICROALBUMIN / CREATININE URINE RATIO
Creatinine,U: 161.1 mg/dL
Microalb Creat Ratio: 1.1 mg/g (ref 0.0–30.0)
Microalb, Ur: 1.7 mg/dL (ref 0.0–1.9)

## 2022-09-14 LAB — LIPID PANEL
Cholesterol: 124 mg/dL (ref 0–200)
HDL: 37.9 mg/dL — ABNORMAL LOW (ref >39.00)
LDL Cholesterol: 70 mg/dL (ref 0–99)
NonHDL: 85.82
Total CHOL/HDL Ratio: 3
Triglycerides: 78 mg/dL (ref 0.0–149.0)
VLDL: 15.6 mg/dL (ref 0.0–40.0)

## 2022-09-14 LAB — VITAMIN B12: Vitamin B-12: 224 pg/mL (ref 211–911)

## 2022-09-14 LAB — HEMOGLOBIN A1C: Hgb A1c MFr Bld: 9.1 % — ABNORMAL HIGH (ref 4.6–6.5)

## 2022-09-14 LAB — VITAMIN D 25 HYDROXY (VIT D DEFICIENCY, FRACTURES): VITD: 30.73 ng/mL (ref 30.00–100.00)

## 2022-09-14 LAB — PSA: PSA: 0.62 ng/mL (ref 0.10–4.00)

## 2022-09-14 LAB — TSH: TSH: 3.2 u[IU]/mL (ref 0.35–5.50)

## 2022-09-28 ENCOUNTER — Other Ambulatory Visit (HOSPITAL_COMMUNITY): Payer: Self-pay

## 2022-09-28 ENCOUNTER — Encounter: Payer: Self-pay | Admitting: Internal Medicine

## 2022-09-28 ENCOUNTER — Ambulatory Visit (INDEPENDENT_AMBULATORY_CARE_PROVIDER_SITE_OTHER): Payer: 59 | Admitting: Internal Medicine

## 2022-09-28 VITALS — BP 124/76 | HR 70 | Temp 98.1°F | Ht 71.0 in | Wt 203.0 lb

## 2022-09-28 DIAGNOSIS — L989 Disorder of the skin and subcutaneous tissue, unspecified: Secondary | ICD-10-CM

## 2022-09-28 DIAGNOSIS — E538 Deficiency of other specified B group vitamins: Secondary | ICD-10-CM | POA: Diagnosis not present

## 2022-09-28 DIAGNOSIS — Z7984 Long term (current) use of oral hypoglycemic drugs: Secondary | ICD-10-CM | POA: Diagnosis not present

## 2022-09-28 DIAGNOSIS — Z0001 Encounter for general adult medical examination with abnormal findings: Secondary | ICD-10-CM | POA: Diagnosis not present

## 2022-09-28 DIAGNOSIS — E1165 Type 2 diabetes mellitus with hyperglycemia: Secondary | ICD-10-CM

## 2022-09-28 DIAGNOSIS — E559 Vitamin D deficiency, unspecified: Secondary | ICD-10-CM

## 2022-09-28 DIAGNOSIS — E785 Hyperlipidemia, unspecified: Secondary | ICD-10-CM | POA: Diagnosis not present

## 2022-09-28 MED ORDER — METFORMIN HCL ER 500 MG PO TB24
2000.0000 mg | ORAL_TABLET | Freq: Every day | ORAL | 3 refills | Status: DC
  Filled 2022-09-28 – 2022-11-01 (×2): qty 360, 90d supply, fill #0
  Filled 2023-02-01: qty 360, 90d supply, fill #1
  Filled 2023-05-05: qty 360, 90d supply, fill #2
  Filled 2023-07-30: qty 360, 90d supply, fill #3

## 2022-09-28 MED ORDER — GLIPIZIDE ER 5 MG PO TB24
5.0000 mg | ORAL_TABLET | Freq: Every day | ORAL | 3 refills | Status: DC
  Filled 2022-09-28: qty 90, 90d supply, fill #0
  Filled 2022-12-24: qty 90, 90d supply, fill #1
  Filled 2023-03-29: qty 90, 90d supply, fill #2

## 2022-09-28 MED ORDER — SITAGLIPTIN PHOSPHATE 100 MG PO TABS
100.0000 mg | ORAL_TABLET | Freq: Every day | ORAL | 3 refills | Status: DC
  Filled 2022-09-28: qty 90, 90d supply, fill #0
  Filled 2022-10-01 – 2022-10-05 (×2): qty 30, 30d supply, fill #0
  Filled 2022-11-01: qty 30, 30d supply, fill #1
  Filled 2022-12-03: qty 30, 30d supply, fill #2
  Filled 2023-01-04: qty 30, 30d supply, fill #3
  Filled 2023-02-01: qty 30, 30d supply, fill #4
  Filled 2023-03-08: qty 30, 30d supply, fill #5
  Filled 2023-03-29 – 2023-04-01 (×3): qty 30, 30d supply, fill #6
  Filled 2023-05-05: qty 30, 30d supply, fill #7
  Filled 2023-06-03: qty 30, 30d supply, fill #8
  Filled 2023-07-02: qty 30, 30d supply, fill #9
  Filled 2023-07-30: qty 30, 30d supply, fill #10
  Filled 2023-09-01: qty 30, 30d supply, fill #11

## 2022-09-28 NOTE — Patient Instructions (Addendum)
Please take all new medication as prescribed= the glipizide XL 5 mg per day  Please continue all other medications as before, and refills have been done if requested - the metformin and januvia  Please have the pharmacy call with any other refills you may need.  Please continue your efforts at being more active, low cholesterol diet, and weight control.  You are otherwise up to date with prevention measures today.  Please keep your appointments with your specialists as you may have planned  Please remember to call for you eye exam now, as it may take months to get the appt  You will be contacted regarding the referral for: dermatology  Please make an Appointment to return in 6 months, or sooner if needed, also with Lab Appointment for testing done 3-5 days before at the FIRST FLOOR Lab (so this is for TWO appointments - please see the scheduling desk as you leave)

## 2022-09-28 NOTE — Progress Notes (Unsigned)
Patient ID: JAH MCQUEARY, male   DOB: 1971-05-25, 52 y.o.   MRN: [id number]         Chief Complaint:: wellness exam and dm, left arm skin lesion, hld, low vit d and b12       HPI:  [name] is a 52 y.o. 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  Did try low dose ozempic after last visit and had significant vomiting for a few days after each dose, Sugars were about 120 - 150.  Wt actually got down to 199-200, but then pharmacy backorder, then insurance stopped covering, and had gotten up to the 0.5 mg weekly with less and less GI effect essentialy resolved by 6 wks.  Has not been taking the last 6 wks due to insurance, now sugars about 200-300s.  Activity overall no change.   Wt now up a few lbs from his low.  Diet no change.  Gets HA with sugar over 200 as well.  Also has skin lesion left arm increased in size x several months.      Wt Readings from Last 3 Encounters:  09/28/22 203 lb (92.1 kg)  03/27/22 209 lb (94.8 kg)  03/18/22 201 lb 6 oz (91.3 kg)   BP Readings from Last 3 Encounters:  09/28/22 124/76  03/27/22 118/72  03/18/22 124/80   Immunization History  Administered Date(s) Administered   Influenza Split 02/23/2011, 03/03/2012   Influenza Whole 01/31/2010   Influenza,inj,Quad PF,6+ Mos 03/09/2013, 05/29/2015, 03/05/2017, 03/10/2018, 02/27/2020, 02/28/2021   PFIZER(Purple Top)SARS-COV-2 Vaccination 12/30/2019, 01/20/2020   Pneumococcal Polysaccharide-23 01/31/2010, 09/01/2016   Td 11/30/2008   Health Maintenance Due  Topic Date Due   DTaP/Tdap/Td (2 - Tdap) 12/01/2018      Past Medical History:  Diagnosis Date   ALLERGIC RHINITIS 12/31/2008   DIABETES MELLITUS, TYPE II 06/03/2009   Dyslipidemia 10/31/2014   FOOT PAIN, RIGHT 08/08/2010   HYPERSOMNIA  11/30/2008   Lumbar disc disease 03/01/2011   OBSTRUCTIVE SLEEP APNEA 02/03/2009   OTHER INFLAMMATORY DISORDER MALE GENITAL ORGANS 11/30/2008   Past Surgical History:  Procedure Laterality Date   HERNIA REPAIR  2012   [facility]    reports that he has never smoked. He has never used smokeless tobacco. He reports current alcohol use. He reports that he does not use drugs. family history includes Cancer in an other family member; Coronary artery disease in his father; Depression in his maternal grandmother and mother; Diabetes in his father; Heart attack in his father; Hypertension in his father. Allergies  Allergen Reactions   Penicillins     Childhood allergy   Current Outpatient Medications on File Prior to Visit  Medication Sig Dispense Refill   Accu-Chek FastClix Lancets MISC Use as directed 2 (two) times daily. 102 each 11   Alcohol Swabs (B-D SINGLE USE SWABS REGULAR) PADS Use as directed     aspirin 81 MG tablet Take 81 mg by mouth daily.     Blood Glucose Monitoring Suppl (FREESTYLE FREEDOM LITE) w/Device KIT Use to check blood sugars twice a day 1 kit 0   Cholecalciferol 50 MCG (2000 UT) TABS Take 1 tablet (2,000  Units total) by mouth daily. 90 tablet 3   diclofenac (VOLTAREN) 75 MG EC tablet Take 1 tablet (75 mg total) by mouth 2 (two) times daily as needed. 60 tablet 5   gabapentin (NEURONTIN) 300 MG capsule 1-2 tab by mouth at bedtime 90 capsule 3   glucose blood (FREESTYLE LITE) test strip Use to check blood sugar 2 (two) times daily. 150 each 3   glucose blood test strip USE TO CHECK BLOOD SUGARS TWICE A DAY     Lancet Device MISC Use as directed twice per day E11.9 200 each 11   nitroGLYCERIN (NITRODUR - DOSED IN MG/24 HR) 0.2 mg/hr patch 1/4 patch daily 30 patch 1   NON FORMULARY Reported on 10/02/2015     vitamin B-12 (CYANOCOBALAMIN) 1000 MCG tablet Take 1 tablet (1,000 mcg total) by mouth daily. 90 tablet 3   No current facility-administered medications on file prior to  visit.        ROS:  All others reviewed and negative.  Objective        PE:  BP 124/76 (BP Location: Right Arm, Patient Position: Sitting, Cuff Size: Normal)   Pulse 70   Temp 98.1 F (36.7 C) (Oral)   Ht 5\' 11"  (1.803 m)   Wt 203 lb (92.1 kg)   SpO2 98%   BMI 28.31 kg/m                 Constitutional: Pt appears in NAD               HENT: Head: NCAT.                Right Ear: External ear normal.                 Left Ear: External ear normal.                Eyes: . Pupils are equal, round, and reactive to light. Conjunctivae and EOM are normal               Nose: without d/c or deformity               Neck: Neck supple. Gross normal ROM               Cardiovascular: Normal rate and regular rhythm.                 Pulmonary/Chest: Effort normal and breath sounds without rales or wheezing.                Abd:  Soft, NT, ND, + BS, no organomegaly               Neurological: Pt is alert. At baseline orientation, motor grossly intact               Skin: Skin is warm. No rashes, left mid lateral arm with brownish raised lesion > 10 mm, LE edema - none               Psychiatric: Pt behavior is normal without agitation   Micro: none  Cardiac tracings I have personally interpreted today:  none  Pertinent Radiological findings (summarize): none   Lab Results  Component Value Date   WBC 7.3 09/14/2022   HGB 16.7 09/14/2022   HCT 48.4 09/14/2022   PLT 206.0 09/14/2022   GLUCOSE 278 (H) 09/14/2022   CHOL 124 09/14/2022   TRIG 78.0 09/14/2022   HDL 37.90 (L) 09/14/2022  LDLDIRECT 70.0 03/05/2017   LDLCALC 70 09/14/2022   ALT 19 09/14/2022   AST 15 09/14/2022   NA 139 09/14/2022   K 4.3 09/14/2022   CL 103 09/14/2022   CREATININE 1.05 09/14/2022   BUN 13 09/14/2022   CO2 27 09/14/2022   TSH 3.20 09/14/2022   PSA 0.62 09/14/2022   HGBA1C 9.1 (H) 09/14/2022   MICROALBUR 1.7 09/14/2022   Assessment/Plan:  [name] is a 52 y.o. White or Caucasian [1] male with  has a  past medical history of ALLERGIC RHINITIS (12/31/2008), DIABETES MELLITUS, TYPE II (06/03/2009), Dyslipidemia (10/31/2014), FOOT PAIN, RIGHT (08/08/2010), HYPERSOMNIA (11/30/2008), Lumbar disc disease (03/01/2011), OBSTRUCTIVE SLEEP APNEA (02/03/2009), and OTHER INFLAMMATORY DISORDER MALE GENITAL ORGANS (11/30/2008).  Encounter for well adult exam with abnormal findings Age and sex appropriate education and counseling updated with regular exercise and diet Referrals for preventative services - for colonoscopy, pt states will call for eye exam soon Immunizations addressed - for tdap and shingrix at pharmacy, Smoking counseling  - none needed Evidence for depression or other mood disorder - none significant Most recent labs reviewed. I have personally reviewed and have noted: 1) the patient's medical and social history 2) The patient's current medications and supplements 3) The patient's height, weight, and BMI have been recorded in the chart   B12 deficiency Lab Results  Component Value Date   VITAMINB12 224 09/14/2022   Low, to start oral replacement - b12 1000 mcg qd   Diabetes Lab Results  Component Value Date   HGBA1C 9.1 (H) 09/14/2022   Unocntrolled, , pt to continue current medical treatment metformin ER 500 mg- 4 qd, januvia 100 qd, and add glipizide xl 5 qd   Dyslipidemia Lab Results  Component Value Date   LDLCALC 70 09/14/2022   Uncontrolled,, pt to continue low chol diet, declines statin for now   Vitamin D deficiency Last vitamin D Lab Results  Component Value Date   VD25OH 30.73 09/14/2022   Low, to start oral replacement   Skin lesion of left arm Recent worsening, for derm referral Followup: Return in about 6 months (around 03/31/2023).  Oliver Barre, MD 09/29/2022 9:09 PM [facility] [facility]

## 2022-09-29 ENCOUNTER — Encounter: Payer: Self-pay | Admitting: Internal Medicine

## 2022-09-29 ENCOUNTER — Other Ambulatory Visit: Payer: Self-pay

## 2022-09-29 ENCOUNTER — Other Ambulatory Visit (HOSPITAL_COMMUNITY): Payer: Self-pay

## 2022-09-29 DIAGNOSIS — L989 Disorder of the skin and subcutaneous tissue, unspecified: Secondary | ICD-10-CM | POA: Insufficient documentation

## 2022-09-29 NOTE — Assessment & Plan Note (Signed)
Lab Results  Component Value Date   HGBA1C 9.1 (H) 09/14/2022   Unocntrolled, , pt to continue current medical treatment metformin ER 500 mg- 4 qd, januvia 100 qd, and add glipizide xl 5 qd

## 2022-09-29 NOTE — Assessment & Plan Note (Signed)
Last vitamin D Lab Results  Component Value Date   VD25OH 30.73 09/14/2022   Low, to start oral replacement

## 2022-09-29 NOTE — Assessment & Plan Note (Signed)
Age and sex appropriate education and counseling updated with regular exercise and diet Referrals for preventative services - for colonoscopy, pt states will call for eye exam soon Immunizations addressed - for tdap and shingrix at pharmacy, Smoking counseling  - none needed Evidence for depression or other mood disorder - none significant Most recent labs reviewed. I have personally reviewed and have noted: 1) the patient's medical and social history 2) The patient's current medications and supplements 3) The patient's height, weight, and BMI have been recorded in the chart

## 2022-09-29 NOTE — Assessment & Plan Note (Signed)
Lab Results  Component Value Date   VITAMINB12 224 09/14/2022   Low, to start oral replacement - b12 1000 mcg qd

## 2022-09-29 NOTE — Assessment & Plan Note (Addendum)
Lab Results  Component Value Date   LDLCALC 70 09/14/2022   Uncontrolled,, pt to continue low chol diet, declines statin for now

## 2022-09-29 NOTE — Assessment & Plan Note (Signed)
Recent worsening, for derm referral

## 2022-10-01 ENCOUNTER — Other Ambulatory Visit (HOSPITAL_COMMUNITY): Payer: Self-pay

## 2022-10-05 ENCOUNTER — Other Ambulatory Visit (HOSPITAL_COMMUNITY): Payer: Self-pay

## 2022-11-02 ENCOUNTER — Other Ambulatory Visit: Payer: Self-pay

## 2022-11-02 ENCOUNTER — Other Ambulatory Visit (HOSPITAL_COMMUNITY): Payer: Self-pay

## 2022-12-24 ENCOUNTER — Other Ambulatory Visit (HOSPITAL_COMMUNITY): Payer: Self-pay

## 2022-12-24 ENCOUNTER — Other Ambulatory Visit: Payer: Self-pay | Admitting: Internal Medicine

## 2022-12-24 ENCOUNTER — Other Ambulatory Visit: Payer: Self-pay

## 2022-12-24 MED ORDER — VITAMIN B-12 1000 MCG PO TABS
1000.0000 ug | ORAL_TABLET | Freq: Every day | ORAL | 3 refills | Status: AC
  Filled 2022-12-24: qty 90, 90d supply, fill #0

## 2022-12-25 ENCOUNTER — Other Ambulatory Visit (HOSPITAL_COMMUNITY): Payer: Self-pay

## 2023-03-25 ENCOUNTER — Other Ambulatory Visit (INDEPENDENT_AMBULATORY_CARE_PROVIDER_SITE_OTHER): Payer: 59

## 2023-03-25 DIAGNOSIS — E559 Vitamin D deficiency, unspecified: Secondary | ICD-10-CM

## 2023-03-25 DIAGNOSIS — E538 Deficiency of other specified B group vitamins: Secondary | ICD-10-CM

## 2023-03-25 DIAGNOSIS — E1165 Type 2 diabetes mellitus with hyperglycemia: Secondary | ICD-10-CM | POA: Diagnosis not present

## 2023-03-25 LAB — HEPATIC FUNCTION PANEL
ALT: 21 U/L (ref 0–53)
AST: 18 U/L (ref 0–37)
Albumin: 4.3 g/dL (ref 3.5–5.2)
Alkaline Phosphatase: 63 U/L (ref 39–117)
Bilirubin, Direct: 0.1 mg/dL (ref 0.0–0.3)
Total Bilirubin: 0.5 mg/dL (ref 0.2–1.2)
Total Protein: 6.8 g/dL (ref 6.0–8.3)

## 2023-03-25 LAB — LIPID PANEL
Cholesterol: 128 mg/dL (ref 0–200)
HDL: 39 mg/dL — ABNORMAL LOW (ref >39.00)
LDL Cholesterol: 71 mg/dL (ref 0–99)
NonHDL: 88.95
Total CHOL/HDL Ratio: 3
Triglycerides: 91 mg/dL (ref 0.0–149.0)
VLDL: 18.2 mg/dL (ref 0.0–40.0)

## 2023-03-25 LAB — BASIC METABOLIC PANEL
BUN: 17 mg/dL (ref 6–23)
CO2: 26 meq/L (ref 19–32)
Calcium: 9.5 mg/dL (ref 8.4–10.5)
Chloride: 104 meq/L (ref 96–112)
Creatinine, Ser: 1.24 mg/dL (ref 0.40–1.50)
GFR: 66.74 mL/min (ref >60.00)
Glucose, Bld: 179 mg/dL — ABNORMAL HIGH (ref 70–99)
Potassium: 4.3 meq/L (ref 3.5–5.1)
Sodium: 140 meq/L (ref 135–145)

## 2023-03-25 LAB — VITAMIN B12: Vitamin B-12: 301 pg/mL (ref 211–911)

## 2023-03-25 LAB — VITAMIN D 25 HYDROXY (VIT D DEFICIENCY, FRACTURES): VITD: 32.41 ng/mL (ref 30.00–100.00)

## 2023-03-25 LAB — HEMOGLOBIN A1C: Hgb A1c MFr Bld: 6.8 % — ABNORMAL HIGH (ref 4.6–6.5)

## 2023-03-29 ENCOUNTER — Ambulatory Visit: Payer: 59 | Admitting: Internal Medicine

## 2023-03-29 ENCOUNTER — Other Ambulatory Visit (HOSPITAL_COMMUNITY): Payer: Self-pay

## 2023-03-30 ENCOUNTER — Other Ambulatory Visit (HOSPITAL_COMMUNITY): Payer: Self-pay

## 2023-04-01 ENCOUNTER — Ambulatory Visit (INDEPENDENT_AMBULATORY_CARE_PROVIDER_SITE_OTHER): Payer: 59 | Admitting: Internal Medicine

## 2023-04-01 ENCOUNTER — Encounter: Payer: Self-pay | Admitting: Internal Medicine

## 2023-04-01 ENCOUNTER — Other Ambulatory Visit (HOSPITAL_COMMUNITY): Payer: Self-pay

## 2023-04-01 ENCOUNTER — Ambulatory Visit: Payer: 59 | Admitting: Internal Medicine

## 2023-04-01 ENCOUNTER — Other Ambulatory Visit: Payer: Self-pay

## 2023-04-01 VITALS — BP 122/80 | HR 75 | Temp 98.4°F | Ht 71.0 in | Wt 217.0 lb

## 2023-04-01 DIAGNOSIS — Z125 Encounter for screening for malignant neoplasm of prostate: Secondary | ICD-10-CM | POA: Diagnosis not present

## 2023-04-01 DIAGNOSIS — E785 Hyperlipidemia, unspecified: Secondary | ICD-10-CM

## 2023-04-01 DIAGNOSIS — E538 Deficiency of other specified B group vitamins: Secondary | ICD-10-CM

## 2023-04-01 DIAGNOSIS — E1165 Type 2 diabetes mellitus with hyperglycemia: Secondary | ICD-10-CM

## 2023-04-01 DIAGNOSIS — E559 Vitamin D deficiency, unspecified: Secondary | ICD-10-CM | POA: Diagnosis not present

## 2023-04-01 MED ORDER — TIRZEPATIDE 2.5 MG/0.5ML ~~LOC~~ SOAJ
2.5000 mg | SUBCUTANEOUS | 11 refills | Status: DC
  Filled 2023-04-01: qty 2, 28d supply, fill #0

## 2023-04-01 NOTE — Progress Notes (Signed)
Patient ID: [name], male   DOB: 1970/09/25, 52 y.o.   MRN: [id number]        Chief Complaint: follow up HTN, HLD and DM, low b12 and low vit d       HPI:  [name] is a 52 y.o. male here overall doing ok,  Pt denies chest pain, increased sob or doe, wheezing, orthopnea, PND, increased LE swelling, palpitations, dizziness or syncope.   Pt denies polydipsia, polyuria, or new focal neuro s/s.    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  Plans to see eye doctor prob dec 2024.  Hard to lose wt with diet and excercise  Wt Readings from Last 3 Encounters:  04/01/23 217 lb (98.4 kg)  09/28/22 203 lb (92.1 kg)  03/27/22 209 lb (94.8 kg)   BP Readings from Last 3 Encounters:  04/01/23 122/80  09/28/22 124/76  03/27/22 118/72         Past Medical History:  Diagnosis Date   ALLERGIC RHINITIS 12/31/2008   DIABETES MELLITUS, TYPE II 06/03/2009   Dyslipidemia 10/31/2014   FOOT PAIN, RIGHT 08/08/2010   HYPERSOMNIA 11/30/2008   Lumbar disc disease 03/01/2011   OBSTRUCTIVE SLEEP APNEA 02/03/2009   OTHER INFLAMMATORY DISORDER MALE GENITAL ORGANS 11/30/2008   Past Surgical History:  Procedure Laterality Date   HERNIA REPAIR  2012   [facility]    reports that he has never smoked. He has never used smokeless tobacco. He reports current alcohol use. He reports that he does not use drugs. family history includes Cancer in an other family member; Coronary artery disease in his father; Depression in his maternal grandmother and mother; Diabetes in his father; Heart attack in his father; Hypertension in his father. Allergies  Allergen Reactions   Penicillins     Childhood allergy   Current Outpatient Medications on File Prior to Visit  Medication Sig Dispense Refill   Accu-Chek FastClix Lancets MISC Use as directed 2 (two) times daily. 102 each 11   Alcohol Swabs (B-D SINGLE USE SWABS REGULAR) PADS Use as  directed     aspirin 81 MG tablet Take 81 mg by mouth daily.     Blood Glucose Monitoring Suppl (FREESTYLE FREEDOM LITE) w/Device KIT Use to check blood sugars twice a day 1 kit 0   Cholecalciferol 50 MCG (2000 UT) TABS Take 1 tablet (2,000 Units total) by mouth daily. 90 tablet 3   cyanocobalamin (VITAMIN B12) 1000 MCG tablet Take 1 tablet (1,000 mcg total) by mouth daily. 90 tablet 3   diclofenac (VOLTAREN) 75 MG EC tablet Take 1 tablet (75 mg total) by mouth 2 (two) times daily as needed. 60 tablet 5   gabapentin (NEURONTIN) 300 MG capsule 1-2 tab by mouth at bedtime 90 capsule 3   glipiZIDE (GLIPIZIDE XL) 5 MG 24 hr tablet Take 1 tablet (5 mg total) by mouth daily with breakfast. 90 tablet 3   glucose blood (FREESTYLE LITE) test strip Use to check blood sugar 2 (two) times daily. 150 each 3   Lancet Device MISC Use as directed twice per day E11.9 200 each 11   metFORMIN (GLUCOPHAGE-XR) 500 MG 24 hr tablet Take 4 tablets (2,000 mg total) by mouth daily with breakfast. 360 tablet 3   nitroGLYCERIN (NITRODUR - DOSED IN MG/24 HR) 0.2 mg/hr patch 1/4 patch daily 30 patch 1   NON FORMULARY Reported on 10/02/2015  sitaGLIPtin (JANUVIA) 100 MG tablet Take 1 tablet (100 mg total) by mouth daily. 90 tablet 3   No current facility-administered medications on file prior to visit.        ROS:  All others reviewed and negative.  Objective        PE:  BP 122/80 (BP Location: Right Arm, Patient Position: Sitting, Cuff Size: Normal)   Pulse 75   Temp 98.4 F (36.9 C) (Oral)   Ht 5\' 11"  (1.803 m)   Wt 217 lb (98.4 kg)   SpO2 96%   BMI 30.27 kg/m                 Constitutional: Pt appears in NAD               HENT: Head: NCAT.                Right Ear: External ear normal.                 Left Ear: External ear normal.                Eyes: . Pupils are equal, round, and reactive to light. Conjunctivae and EOM are normal               Nose: without d/c or deformity               Neck: Neck  supple. Gross normal ROM               Cardiovascular: Normal rate and regular rhythm.                 Pulmonary/Chest: Effort normal and breath sounds without rales or wheezing.                Abd:  Soft, NT, ND, + BS, no organomegaly               Neurological: Pt is alert. At baseline orientation, motor grossly intact               Skin: Skin is warm. No rashes, no other new lesions, LE edema - none               Psychiatric: Pt behavior is normal without agitation   Micro: none  Cardiac tracings I have personally interpreted today:  none  Pertinent Radiological findings (summarize): none   Lab Results  Component Value Date   WBC 7.3 09/14/2022   HGB 16.7 09/14/2022   HCT 48.4 09/14/2022   PLT 206.0 09/14/2022   GLUCOSE 179 (H) 03/25/2023   CHOL 128 03/25/2023   TRIG 91.0 03/25/2023   HDL 39.00 (L) 03/25/2023   LDLDIRECT 70.0 03/05/2017   LDLCALC 71 03/25/2023   ALT 21 03/25/2023   AST 18 03/25/2023   NA 140 03/25/2023   K 4.3 03/25/2023   CL 104 03/25/2023   CREATININE 1.24 03/25/2023   BUN 17 03/25/2023   CO2 26 03/25/2023   TSH 3.20 09/14/2022   PSA 0.62 09/14/2022   HGBA1C 6.8 (H) 03/25/2023   MICROALBUR 1.7 09/14/2022   Assessment/Plan:  [name] is a 52 y.o. White or Caucasian [1] male with  has a past medical history of ALLERGIC RHINITIS (12/31/2008), DIABETES MELLITUS, TYPE II (06/03/2009), Dyslipidemia (10/31/2014), FOOT PAIN, RIGHT (08/08/2010), HYPERSOMNIA (11/30/2008), Lumbar disc disease (03/01/2011), OBSTRUCTIVE SLEEP APNEA (02/03/2009), and OTHER INFLAMMATORY DISORDER MALE GENITAL ORGANS (11/30/2008).  Vitamin D deficiency Last vitamin D Lab Results  Component  Value Date   VD25OH 32.41 03/25/2023   Low, to start oral replacement   Dyslipidemia Lab Results  Component Value Date   LDLCALC 71 03/25/2023   Uncontrolled, goal ldl < 70, decliens start statin for now   Diabetes Lab Results  Component Value Date   HGBA1C 6.8 (H) 03/25/2023   Stable,  pt to continue current medical treatment metformin ER 500 mg - 4 every day, januvia 100 every day, but change the glipizide 5 mg and increase the mounjaro to 5 mg weekly   B12 deficiency Lab Results  Component Value Date   VITAMINB12 301 03/25/2023   Low, to start oral replacement - b12 1000 mcg qd  Followup: Return in about 6 months (around 09/29/2023).  Oliver Barre, MD 04/03/2023 6:50 PM [facility] [place] [facility] - [facility]

## 2023-04-01 NOTE — Patient Instructions (Addendum)
Ok to start the mounjaro 2.5 mg weekly if ok with insurance, and send [place] a Mychart message after one of taking if you are doing well for the increased dose to 5 mg  Ok to stop the glipizide if you start the mounjaro  Please continue all other medications as before, and refills have been done if requested.  Please have the pharmacy call with any other refills you may need.  Please continue your efforts at being more active, low cholesterol diet, and weight control.  Please keep your appointments with your specialists as you may have planned  Please make an Appointment to return in 6 months, or sooner if needed, also with Lab Appointment for testing done 3-5 days before at the FIRST FLOOR Lab (so this is for TWO appointments - please see the scheduling desk as you leave)

## 2023-04-02 ENCOUNTER — Ambulatory Visit: Payer: 59 | Admitting: Internal Medicine

## 2023-04-03 ENCOUNTER — Encounter: Payer: Self-pay | Admitting: Internal Medicine

## 2023-04-03 NOTE — Assessment & Plan Note (Addendum)
Lab Results  Component Value Date   HGBA1C 6.8 (H) 03/25/2023   Stable, pt to continue current medical treatment metformin ER 500 mg - 4 every day, januvia 100 every day, but change the glipizide 5 mg and increase the mounjaro to 5 mg weekly

## 2023-04-03 NOTE — Assessment & Plan Note (Signed)
Last vitamin D Lab Results  Component Value Date   VD25OH 32.41 03/25/2023   Low, to start oral replacement

## 2023-04-03 NOTE — Assessment & Plan Note (Signed)
Lab Results  Component Value Date   LDLCALC 71 03/25/2023   Uncontrolled, goal ldl < 70, decliens start statin for now

## 2023-04-03 NOTE — Assessment & Plan Note (Signed)
Lab Results  Component Value Date   VITAMINB12 301 03/25/2023   Low, to start oral replacement - b12 1000 mcg qd

## 2023-04-03 NOTE — Addendum Note (Signed)
Addended by: Corwin Levins on: 04/03/2023 06:51 PM   Modules accepted: Orders

## 2023-05-05 ENCOUNTER — Other Ambulatory Visit (HOSPITAL_COMMUNITY): Payer: Self-pay

## 2023-05-05 ENCOUNTER — Encounter: Payer: Self-pay | Admitting: Internal Medicine

## 2023-05-05 MED ORDER — TIRZEPATIDE 5 MG/0.5ML ~~LOC~~ SOAJ
5.0000 mg | SUBCUTANEOUS | 3 refills | Status: DC
  Filled 2023-05-05: qty 6, 84d supply, fill #0
  Filled 2023-05-05: qty 2, 28d supply, fill #0
  Filled 2023-06-03 – 2023-06-18 (×4): qty 2, 28d supply, fill #1

## 2023-05-20 ENCOUNTER — Ambulatory Visit: Payer: 59 | Admitting: Dermatology

## 2023-05-28 ENCOUNTER — Other Ambulatory Visit (HOSPITAL_COMMUNITY): Payer: Self-pay

## 2023-06-03 ENCOUNTER — Other Ambulatory Visit (HOSPITAL_COMMUNITY): Payer: Self-pay

## 2023-06-03 ENCOUNTER — Other Ambulatory Visit: Payer: Self-pay

## 2023-06-04 ENCOUNTER — Other Ambulatory Visit (HOSPITAL_COMMUNITY): Payer: Self-pay

## 2023-06-11 ENCOUNTER — Telehealth: Payer: Self-pay | Admitting: Internal Medicine

## 2023-06-11 ENCOUNTER — Other Ambulatory Visit (HOSPITAL_COMMUNITY): Payer: Self-pay

## 2023-06-11 ENCOUNTER — Telehealth: Payer: Self-pay

## 2023-06-11 NOTE — Telephone Encounter (Signed)
Yes unfortunately this is typical; ok to continue the medication if he is able to tolerate, as these symptoms very often improve with the next few shots

## 2023-06-11 NOTE — Telephone Encounter (Unsigned)
Copied from CRM [id number]. Topic: Clinical - Medication Refill >> Jun 11, 2023 10:26 AM Aletta Edouard wrote: Most Recent Primary Care Visit:  Provider: Corwin Levins  Department: [facility] [place]  Visit Type: OFFICE VISIT  Date: 04/01/2023  Medication: Greggory Keen   Has the patient contacted their pharmacy? Yes (Agent: If no, request that the patient contact the pharmacy for the refill. If patient does not wish to contact the pharmacy document the reason why and proceed with request.) (Agent: If yes, when and what did the pharmacy advise?)  Is this the correct pharmacy for this prescription? Yes If no, delete pharmacy and type the correct one.  This is the patient's preferred pharmacy:  [place] [facility] [address] [place] [address] Phone: [phone] Fax: [phone]  [facility] [address] [place] [address] Phone: [phone] Fax: [phone]   Has the prescription been filled recently? No  Is the patient out of the medication? Yes  Has the patient been seen for an appointment in the last year OR does the patient have an upcoming appointment? Yes  Can we respond through MyChart? No       PATIENT WOULD LIKE A CALL BACK REGARDING THIS  Agent: Please be advised that Rx refills may take up to 3 business days. We ask that you follow-up with your pharmacy.

## 2023-06-11 NOTE — Telephone Encounter (Signed)
Sent a message to PA team to start the process.

## 2023-06-14 ENCOUNTER — Telehealth: Payer: Self-pay | Admitting: Pharmacy Technician

## 2023-06-14 ENCOUNTER — Other Ambulatory Visit (HOSPITAL_COMMUNITY): Payer: Self-pay

## 2023-06-14 NOTE — Telephone Encounter (Signed)
Pharmacy Patient Advocate Encounter   Received notification from CoverMyMeds that prior authorization for [facility] 5MG /0.5ML auto-injectors is required/requested.   Insurance verification completed.   The patient is insured through [facility]  .   Per test claim: PA required; PA submitted to above mentioned insurance via CoverMyMeds Key/confirmation #/EOC [facility] Status is pending

## 2023-06-14 NOTE — Telephone Encounter (Signed)
PA request has been Submitted. New Encounter created for follow up. For additional info see Pharmacy Prior Auth telephone encounter from 06/14/23.

## 2023-06-18 ENCOUNTER — Other Ambulatory Visit (HOSPITAL_COMMUNITY): Payer: Self-pay

## 2023-06-18 NOTE — Telephone Encounter (Signed)
Pharmacy Patient Advocate Encounter  Received notification from [facility]  that Prior Authorization for Mounjaro 5mg /0.37ml has been DENIED.  Full denial letter will be uploaded to the media tab. See denial reason below.   PA #/Case ID/Reference #:

## 2023-06-21 ENCOUNTER — Telehealth: Payer: Self-pay

## 2023-06-21 ENCOUNTER — Other Ambulatory Visit (HOSPITAL_COMMUNITY): Payer: Self-pay

## 2023-06-21 NOTE — Telephone Encounter (Signed)
Pharmacy Patient Advocate Encounter   Received notification from  [facility] Portal that prior authorization for Ozempic (0.25 or 0.5 MG/DOSE) 2MG /3ML pen-injectors is required/requested.   Insurance verification completed.   The patient is insured through [facility]  .   Per test claim: PA required; PA submitted to above mentioned insurance via CoverMyMeds Key/confirmation #/EOC  [id number] Status is pending

## 2023-06-21 NOTE — Telephone Encounter (Signed)
Pharmacy Patient Advocate Encounter  Received notification from [facility]  that Prior Authorization for Ozempic (0.25 or 0.5 MG/DOSE) 2MG /3ML pen-injectors has been APPROVED from 05/22/23 to 06/20/24   PA #/Case ID/Reference #: [id number]

## 2023-06-24 DIAGNOSIS — E119 Type 2 diabetes mellitus without complications: Secondary | ICD-10-CM | POA: Diagnosis not present

## 2023-06-24 DIAGNOSIS — H5213 Myopia, bilateral: Secondary | ICD-10-CM | POA: Diagnosis not present

## 2023-06-24 LAB — HM DIABETES EYE EXAM

## 2023-06-25 ENCOUNTER — Other Ambulatory Visit (HOSPITAL_COMMUNITY): Payer: Self-pay

## 2023-06-30 ENCOUNTER — Encounter: Payer: Self-pay | Admitting: Internal Medicine

## 2023-07-01 ENCOUNTER — Other Ambulatory Visit (HOSPITAL_COMMUNITY): Payer: Self-pay

## 2023-07-01 MED ORDER — OZEMPIC (0.25 OR 0.5 MG/DOSE) 2 MG/3ML ~~LOC~~ SOPN
0.2500 mg | PEN_INJECTOR | SUBCUTANEOUS | 11 refills | Status: DC
  Filled 2023-07-01: qty 3, 30d supply, fill #0
  Filled 2023-08-19: qty 3, 28d supply, fill #1
  Filled 2023-09-23: qty 3, 30d supply, fill #2

## 2023-07-02 ENCOUNTER — Other Ambulatory Visit (HOSPITAL_COMMUNITY): Payer: Self-pay

## 2023-07-02 ENCOUNTER — Other Ambulatory Visit: Payer: Self-pay

## 2023-08-12 ENCOUNTER — Encounter: Payer: Self-pay | Admitting: Dermatology

## 2023-08-12 ENCOUNTER — Ambulatory Visit: Payer: 59 | Admitting: Dermatology

## 2023-08-12 VITALS — BP 119/85

## 2023-08-12 DIAGNOSIS — W908XXA Exposure to other nonionizing radiation, initial encounter: Secondary | ICD-10-CM

## 2023-08-12 DIAGNOSIS — L578 Other skin changes due to chronic exposure to nonionizing radiation: Secondary | ICD-10-CM

## 2023-08-12 DIAGNOSIS — D044 Carcinoma in situ of skin of scalp and neck: Secondary | ICD-10-CM | POA: Diagnosis not present

## 2023-08-12 DIAGNOSIS — D492 Neoplasm of unspecified behavior of bone, soft tissue, and skin: Secondary | ICD-10-CM

## 2023-08-12 DIAGNOSIS — D2262 Melanocytic nevi of left upper limb, including shoulder: Secondary | ICD-10-CM | POA: Diagnosis not present

## 2023-08-12 DIAGNOSIS — L570 Actinic keratosis: Secondary | ICD-10-CM | POA: Diagnosis not present

## 2023-08-12 DIAGNOSIS — D045 Carcinoma in situ of skin of trunk: Secondary | ICD-10-CM | POA: Diagnosis not present

## 2023-08-12 DIAGNOSIS — D489 Neoplasm of uncertain behavior, unspecified: Secondary | ICD-10-CM

## 2023-08-12 NOTE — Patient Instructions (Signed)
 Hello [name],  Thank you for visiting today. Here is a summary of the key instructions:  - Procedures:   - Biopsy of two spots with  suspicious for skin cancer   - Freezing of precancerous spots on face and arms with liquid nitrogen  - Wound Care:   - Keep biopsy and frozen areas clean with soap and water   - Apply Aquaphor and a Band-Aid daily for about two weeks  - Skin Care:   - Use provided moisturizers with sunscreen daily on neck, chest, face, and ears  - Follow-up:   - Check MyChart for benign biopsy results   - Expect a call to set up surgery with Dr. Caralyn Guile if skin cancer is found  Please reach out if you have any questions or concerns.  Warm regards,  Dr. [facility]    Patient Handout: Wound Care for Skin Biopsy Site  Taking Care of Your Skin Biopsy Site  Proper care of the biopsy site is essential for promoting healing and minimizing scarring. This handout provides instructions on how to care for your biopsy site to ensure optimal recovery.  1. Cleaning the Wound:  Clean the biopsy site daily with gentle soap and water. Gently pat the area dry with a clean, soft towel. Avoid harsh scrubbing or rubbing the area, as this can irritate the skin and delay healing.  2. Applying Aquaphor and Bandage:  After cleaning the wound, apply a thin layer of Aquaphor ointment to the biopsy site. Cover the area with a sterile bandage to protect it from dirt, bacteria, and friction. Change the bandage daily or as needed if it becomes soiled or wet.  3. Continued Care for One Week:  Repeat the cleaning, Aquaphor application, and bandaging process daily for one week following the biopsy procedure. Keeping the wound clean and moist during this initial healing period will help prevent infection and promote optimal healing.  4. Massaging Aquaphor into the Area:  ---After one week, discontinue the use of bandages but continue to apply Aquaphor to the biopsy  site. ----Gently massage the Aquaphor into the area using circular motions. ---Massaging the skin helps to promote circulation and prevent the formation of scar tissue.   Additional Tips:  Avoid exposing the biopsy site to direct sunlight during the healing process, as this can cause hyperpigmentation or worsen scarring. If you experience any signs of infection, such as increased redness, swelling, warmth, or drainage from the wound, contact your healthcare provider immediately. Follow any additional instructions provided by your healthcare provider for caring for the biopsy site and managing any discomfort. Conclusion:  Taking proper care of your skin biopsy site is crucial for ensuring optimal healing and minimizing scarring. By following these instructions for cleaning, applying Aquaphor, and massaging the area, you can promote a smooth and successful recovery. If you have any questions or concerns about caring for your biopsy site, don't hesitate to contact your healthcare provider for guidance.     Important Information  Due to recent changes in healthcare laws, you may see results of your pathology and/or laboratory studies on MyChart before the doctors have had a chance to review them. We understand that in some cases there may be results that are confusing or concerning to you. Please understand that not all results are received at the same time and often the doctors may need to interpret multiple results in order to provide you with the best plan of care or course of treatment. Therefore, we ask  that you please give [place] 2 business days to thoroughly review all your results before contacting the office for clarification. Should we see a critical lab result, you will be contacted sooner.   If You Need Anything After Your Visit  If you have any questions or concerns for your doctor, please call our main line at [phone] If no one answers, please leave a voicemail as directed and we  will return your call as soon as possible. Messages left after 4 pm will be answered the following business day.   You may also send [place] a message via MyChart. We typically respond to MyChart messages within 1-2 business days.  For prescription refills, please ask your pharmacy to contact our office. Our fax number is [phone].  If you have an urgent issue when the clinic is closed that cannot wait until the next business day, you can page your doctor at the number below.    Please note that while we do our best to be available for urgent issues outside of office hours, we are not available 24/7.   If you have an urgent issue and are unable to reach [place], you may choose to seek medical care at your doctor's office, retail clinic, urgent care center, or emergency room.  If you have a medical emergency, please immediately call 911 or go to the emergency department. In the event of inclement weather, please call our main line at [phone] for an update on the status of any delays or closures.  Dermatology Medication Tips: Please keep the boxes that topical medications come in in order to help keep track of the instructions about where and how to use these. Pharmacies typically print the medication instructions only on the boxes and not directly on the medication tubes.   If your medication is too expensive, please contact our office at [phone] or send [place] a message through MyChart.   We are unable to tell what your co-pay for medications will be in advance as this is different depending on your insurance coverage. However, we may be able to find a substitute medication at lower cost or fill out paperwork to get insurance to cover a needed medication.   If a prior authorization is required to get your medication covered by your insurance company, please allow [place] 1-2 business days to complete this process.  Drug prices often vary depending on where the prescription is filled and some  pharmacies may offer cheaper prices.  The website www.goodrx.com contains coupons for medications through different pharmacies. The prices here do not account for what the cost may be with help from insurance (it may be cheaper with your insurance), but the website can give you the price if you did not use any insurance.  - You can print the associated coupon and take it with your prescription to the pharmacy.  - You may also stop by our office during regular business hours and pick up a GoodRx coupon card.  - If you need your prescription sent electronically to a different pharmacy, notify our office through [facility] or by phone at [phone]

## 2023-08-12 NOTE — Progress Notes (Signed)
 New Patient Visit   Subjective  [name] is a 53 y.o. male who presents for the following: skin lesion of left and right upper arm. Left arm lesion has be present for over a year .  Patient presents with multiple concerning skin lesions. He reports a lesion that took a long time to heal, with a history of bleeding and scabbing. Another lesion on the back of his arm has persisted for months without healing. He also notes a persistent spot on his face and brown spots on his ears. The patient admits to occasional scratching of one lesion, which appeared 1-2 weeks ago. He has a history of significant sun exposure due to his occupation. The patient reports no prior dermatology consultations.  The following portions of the chart were reviewed this encounter and updated as appropriate: medications, allergies, medical history  Review of Systems:  No other skin or systemic complaints except as noted in HPI or Assessment and Plan.  Objective  Well appearing patient in no apparent distress; mood and affect are within normal limits.  A full examination was performed including scalp, head, eyes, ears, nose, lips, neck, chest, axillae, abdomen, back, buttocks, bilateral upper extremities. All findings within normal limits unless otherwise noted below.    Relevant exam findings are noted in the Assessment and Plan.  left neck 6mm irregular papule  left forearm 6mm irregular papule   Assessment & Plan   1. Suspicious skin lesions - Assessment: Multiple concerning skin lesions, including two with nodular components that are firmer, suggesting possible skin cancers. One lesion reports prolonged healing time and bleeding, another on the arm has persisted for months. 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 - Plan:    Perform biopsies on two suspicious lesions with nodular components    Send biopsy samples for pathological examination    Await biopsy results to  determine if further surgical intervention is needed    If benign, inform patient via MyChart    If malignant, schedule follow-up appointment for surgical excision with Dr. Pasi  2. Actinic keratoses - Assessment: Multiple precancerous lesions identified, consistent with actinic keratoses. These lesions have the potential to progress to squamous cell carcinoma if left untreated. - Plan:    Perform cryotherapy with liquid nitrogen on identified actinic keratoses    Educate patient on post-treatment care:     - Keep areas clean with soap and water     - Apply Aquaphor and Band-Aid daily for about two weeks    Anticipate crusty and scabby appearance post-treatment, with resolution in approximately two weeks  3. Actinic Damage  - Assessment: Patient exhibits signs of significant sun damage, including a solar lentigo on the neck. Increased risk for developing further sun-related skin conditions due to outdoor occupation and history of sun exposure. - Plan:    Provide samples of moisturizers with sunscreen    Educate patient on daily application of sunscreen to neck, chest, face, and ears    Emphasize importance of sun protection for preventing future skin cancers    Schedule regular follow-up appointments for skin examinations   NEOPLASM OF UNCERTAIN BEHAVIOR (2) left neck Skin / nail biopsy Type of biopsy: tangential   Informed consent: discussed and consent obtained   Procedure prep:  Patient was prepped and draped in usual sterile fashion Anesthesia: the lesion was anesthetized in a standard fashion   Anesthetic:  1% lidocaine w/ epinephrine 1-100,000 nerve block Instrument used: DermaBlade  Hemostasis achieved with: aluminum chloride   Outcome: patient tolerated procedure well   Post-procedure details: wound care instructions given   Specimen 1 - Surgical pathology Differential Diagnosis: R/o NMSC  Check Margins: No left forearm Skin / nail biopsy Type of biopsy: tangential    Informed consent: discussed and consent obtained   Procedure prep:  Patient was prepped and draped in usual sterile fashion Instrument used: DermaBlade   Hemostasis achieved with: aluminum chloride   Outcome: patient tolerated procedure well   Post-procedure details: wound care instructions given   Specimen 2 - Surgical pathology Differential Diagnosis: R/o NMSC  Check Margins: No  No follow-ups on file.  [name], CMA, am acting as scribe for [employer], DO.   Documentation: I have reviewed the above documentation for accuracy and completeness, and I agree with the above.  Joshua Reusing, DO

## 2023-08-13 LAB — SURGICAL PATHOLOGY

## 2023-08-16 NOTE — Progress Notes (Signed)
 Please call pt and notify their bx results were positive for a skin CA that will be treated by Dr. Onalee Hua  Please schedule ED&C (regular appt slot but no double book)  Diagnosis 1. Skin , left neck --> ED&C SQUAMOUS CELL CARCINOMA IN SITU, HYPERTROPHIC  2. Skin , left forearm --> Benign COMBINED MELANOCYTIC NEVUS, IRRITATED

## 2023-08-17 ENCOUNTER — Other Ambulatory Visit (HOSPITAL_COMMUNITY): Payer: Self-pay

## 2023-08-18 ENCOUNTER — Telehealth: Payer: Self-pay

## 2023-08-18 NOTE — Telephone Encounter (Signed)
-----   Message from Langston Reusing sent at 08/16/2023  4:58 PM EDT ----- Please call [name] and notify their bx results were positive for a skin CA that will be treated by Dr. Onalee Hua  Please schedule ED&C (regular appt slot but no double book)  Diagnosis 1. Skin , left neck --> ED&C SQUAMOUS CELL CARCINOMA IN SITU, HYPERTROPHIC  2. Skin , left forearm --> Benign COMBINED MELANOCYTIC NEVUS, IRRITATED

## 2023-08-18 NOTE — Telephone Encounter (Signed)
Left message for patient to call office for results/hd 

## 2023-08-19 ENCOUNTER — Other Ambulatory Visit (HOSPITAL_COMMUNITY): Payer: Self-pay

## 2023-08-26 ENCOUNTER — Telehealth: Payer: Self-pay

## 2023-08-26 NOTE — Telephone Encounter (Signed)
Left message for patient to call office for results/hd 

## 2023-08-26 NOTE — Telephone Encounter (Signed)
-----   Message from Langston Reusing sent at 08/16/2023  4:58 PM EDT ----- Please call pt and notify their bx results were positive for a skin CA that will be treated by Dr. Onalee Hua  Please schedule ED&C (regular appt slot but no double book)  Diagnosis 1. Skin , left neck --> ED&C SQUAMOUS CELL CARCINOMA IN SITU, HYPERTROPHIC  2. Skin , left forearm --> Benign COMBINED MELANOCYTIC NEVUS, IRRITATED

## 2023-09-02 ENCOUNTER — Other Ambulatory Visit: Payer: Self-pay

## 2023-09-14 ENCOUNTER — Telehealth: Payer: Self-pay

## 2023-09-14 NOTE — Telephone Encounter (Signed)
 Advised patient of results and scheduled him for [facility] 10/06/23 at 12:45/hd

## 2023-09-14 NOTE — Telephone Encounter (Signed)
-----   Message from Langston Reusing sent at 08/16/2023  4:58 PM EDT ----- Please call pt and notify their bx results were positive for a skin CA that will be treated by Dr. Onalee Hua  Please schedule ED&C (regular appt slot but no double book)  Diagnosis 1. Skin , left neck --> ED&C SQUAMOUS CELL CARCINOMA IN SITU, HYPERTROPHIC  2. Skin , left forearm --> Benign COMBINED MELANOCYTIC NEVUS, IRRITATED

## 2023-09-23 ENCOUNTER — Other Ambulatory Visit (INDEPENDENT_AMBULATORY_CARE_PROVIDER_SITE_OTHER)

## 2023-09-23 ENCOUNTER — Other Ambulatory Visit (HOSPITAL_COMMUNITY): Payer: Self-pay

## 2023-09-23 DIAGNOSIS — Z125 Encounter for screening for malignant neoplasm of prostate: Secondary | ICD-10-CM

## 2023-09-23 DIAGNOSIS — E1165 Type 2 diabetes mellitus with hyperglycemia: Secondary | ICD-10-CM | POA: Diagnosis not present

## 2023-09-23 DIAGNOSIS — E559 Vitamin D deficiency, unspecified: Secondary | ICD-10-CM | POA: Diagnosis not present

## 2023-09-23 DIAGNOSIS — E538 Deficiency of other specified B group vitamins: Secondary | ICD-10-CM

## 2023-09-24 LAB — CBC WITH DIFFERENTIAL/PLATELET
Basophils Absolute: 0.1 10*3/uL (ref 0.0–0.1)
Basophils Relative: 0.9 % (ref 0.0–3.0)
Eosinophils Absolute: 0.2 10*3/uL (ref 0.0–0.7)
Eosinophils Relative: 2.3 % (ref 0.0–5.0)
HCT: 48.3 % (ref 39.0–52.0)
Hemoglobin: 16.2 g/dL (ref 13.0–17.0)
Lymphocytes Relative: 32.4 % (ref 12.0–46.0)
Lymphs Abs: 2.6 10*3/uL (ref 0.7–4.0)
MCHC: 33.6 g/dL (ref 30.0–36.0)
MCV: 92.3 fl (ref 78.0–100.0)
Monocytes Absolute: 0.8 10*3/uL (ref 0.1–1.0)
Monocytes Relative: 9.3 % (ref 3.0–12.0)
Neutro Abs: 4.5 10*3/uL (ref 1.4–7.7)
Neutrophils Relative %: 55.1 % (ref 43.0–77.0)
Platelets: 215 10*3/uL (ref 150.0–400.0)
RBC: 5.23 Mil/uL (ref 4.22–5.81)
RDW: 14 % (ref 11.5–15.5)
WBC: 8.1 10*3/uL (ref 4.0–10.5)

## 2023-09-24 LAB — HEPATIC FUNCTION PANEL
ALT: 25 U/L (ref 0–53)
AST: 18 U/L (ref 0–37)
Albumin: 4.7 g/dL (ref 3.5–5.2)
Alkaline Phosphatase: 76 U/L (ref 39–117)
Bilirubin, Direct: 0.1 mg/dL (ref 0.0–0.3)
Total Bilirubin: 0.6 mg/dL (ref 0.2–1.2)
Total Protein: 6.7 g/dL (ref 6.0–8.3)

## 2023-09-24 LAB — URINALYSIS, ROUTINE W REFLEX MICROSCOPIC
Bilirubin Urine: NEGATIVE
Hgb urine dipstick: NEGATIVE
Ketones, ur: NEGATIVE
Leukocytes,Ua: NEGATIVE
Nitrite: NEGATIVE
Specific Gravity, Urine: >=1.03 — AB (ref 1.000–1.030)
Total Protein, Urine: NEGATIVE
Urine Glucose: 100 — AB
Urobilinogen, UA: 0.2 (ref 0.0–1.0)
pH: 6 (ref 5.0–8.0)

## 2023-09-24 LAB — BASIC METABOLIC PANEL WITH GFR
BUN: 16 mg/dL (ref 6–23)
CO2: 28 meq/L (ref 19–32)
Calcium: 9.7 mg/dL (ref 8.4–10.5)
Chloride: 102 meq/L (ref 96–112)
Creatinine, Ser: 1.26 mg/dL (ref 0.40–1.50)
GFR: 65.24 mL/min (ref >60.00)
Glucose, Bld: 150 mg/dL — ABNORMAL HIGH (ref 70–99)
Potassium: 4.3 meq/L (ref 3.5–5.1)
Sodium: 140 meq/L (ref 135–145)

## 2023-09-24 LAB — LIPID PANEL
Cholesterol: 129 mg/dL (ref 0–200)
HDL: 38.3 mg/dL — ABNORMAL LOW (ref >39.00)
LDL Cholesterol: 46 mg/dL (ref 0–99)
NonHDL: 90.9
Total CHOL/HDL Ratio: 3
Triglycerides: 223 mg/dL — ABNORMAL HIGH (ref 0.0–149.0)
VLDL: 44.6 mg/dL — ABNORMAL HIGH (ref 0.0–40.0)

## 2023-09-24 LAB — TSH: TSH: 2.53 u[IU]/mL (ref 0.35–5.50)

## 2023-09-24 LAB — PSA: PSA: 0.69 ng/mL (ref 0.10–4.00)

## 2023-09-24 LAB — VITAMIN B12: Vitamin B-12: 232 pg/mL (ref 211–911)

## 2023-09-24 LAB — MICROALBUMIN / CREATININE URINE RATIO
Creatinine,U: 131.9 mg/dL
Microalb Creat Ratio: UNDETERMINED mg/g (ref 0.0–30.0)
Microalb, Ur: <0.7 mg/dL

## 2023-09-24 LAB — HEMOGLOBIN A1C: Hgb A1c MFr Bld: 8.3 % — ABNORMAL HIGH (ref 4.6–6.5)

## 2023-09-24 LAB — VITAMIN D 25 HYDROXY (VIT D DEFICIENCY, FRACTURES): VITD: 21.38 ng/mL — ABNORMAL LOW (ref 30.00–100.00)

## 2023-09-29 ENCOUNTER — Encounter: Payer: Self-pay | Admitting: Internal Medicine

## 2023-09-29 ENCOUNTER — Other Ambulatory Visit (HOSPITAL_COMMUNITY): Payer: Self-pay

## 2023-09-29 ENCOUNTER — Ambulatory Visit: Payer: 59 | Admitting: Internal Medicine

## 2023-09-29 VITALS — BP 120/78 | HR 80 | Temp 98.8°F | Ht 71.0 in | Wt 204.0 lb

## 2023-09-29 DIAGNOSIS — E538 Deficiency of other specified B group vitamins: Secondary | ICD-10-CM | POA: Diagnosis not present

## 2023-09-29 DIAGNOSIS — Z7984 Long term (current) use of oral hypoglycemic drugs: Secondary | ICD-10-CM

## 2023-09-29 DIAGNOSIS — Z0001 Encounter for general adult medical examination with abnormal findings: Secondary | ICD-10-CM

## 2023-09-29 DIAGNOSIS — Z23 Encounter for immunization: Secondary | ICD-10-CM

## 2023-09-29 DIAGNOSIS — E1165 Type 2 diabetes mellitus with hyperglycemia: Secondary | ICD-10-CM | POA: Diagnosis not present

## 2023-09-29 DIAGNOSIS — Z7985 Long-term (current) use of injectable non-insulin antidiabetic drugs: Secondary | ICD-10-CM

## 2023-09-29 DIAGNOSIS — Z0000 Encounter for general adult medical examination without abnormal findings: Secondary | ICD-10-CM

## 2023-09-29 DIAGNOSIS — E559 Vitamin D deficiency, unspecified: Secondary | ICD-10-CM

## 2023-09-29 MED ORDER — SEMAGLUTIDE (1 MG/DOSE) 4 MG/3ML ~~LOC~~ SOPN
1.0000 mg | PEN_INJECTOR | SUBCUTANEOUS | 3 refills | Status: DC
  Filled 2023-09-29 – 2023-10-26 (×2): qty 3, 28d supply, fill #0

## 2023-09-29 NOTE — Progress Notes (Unsigned)
 Patient ID: [name], male   DOB: 1971-01-15, 53 y.o.   MRN: [id number]         Chief Complaint:: wellness exam and dm, low b12 and vit d       HPI:  [name] is a 53 y.o. male here for wellness exam; due for prevnar 20, for tdap and shingrix at pharmacy, declines colonoscopy, o/w up to date                        Also has known left base of neck skin ca for f/u derm soon for further excision.  Pt denies chest pain, increased sob or doe, wheezing, orthopnea, PND, increased LE swelling, palpitations, dizziness or syncope.   Pt denies polydipsia, polyuria, or new focal neuro s/s.    Pt denies fever, wt loss, night sweats, loss of appetite, or other constitutional symptoms   Wt has been up and down, now has sit down job at work but looking to move into another position with more activity.  Wt Readings from Last 3 Encounters:  09/29/23 204 lb (92.5 kg)  04/01/23 217 lb (98.4 kg)  09/28/22 203 lb (92.1 kg)   BP Readings from Last 3 Encounters:  09/29/23 120/78  08/12/23 119/85  04/01/23 122/80   Immunization History  Administered Date(s) Administered   Influenza Split 02/23/2011, 03/03/2012   Influenza Whole 01/31/2010   Influenza,inj,Quad PF,6+ Mos 03/09/2013, 05/29/2015, 03/05/2017, 03/10/2018, 02/27/2020, 02/28/2021   PFIZER(Purple Top)SARS-COV-2 Vaccination 12/30/2019, 01/20/2020   PNEUMOCOCCAL CONJUGATE-20 09/29/2023   Pneumococcal Polysaccharide-23 01/31/2010, 09/01/2016   Td 11/30/2008   Health Maintenance Due  Topic Date Due   Zoster Vaccines- Shingrix (1 of 2) Never done   Colonoscopy  Never done   DTaP/Tdap/Td (2 - Tdap) 12/01/2018      Past Medical History:  Diagnosis Date   ALLERGIC RHINITIS 12/31/2008   DIABETES MELLITUS, TYPE II 06/03/2009   Dyslipidemia 10/31/2014   FOOT PAIN, RIGHT 08/08/2010   HYPERSOMNIA 11/30/2008   Lumbar disc disease 03/01/2011   OBSTRUCTIVE SLEEP APNEA 02/03/2009   OTHER INFLAMMATORY DISORDER MALE GENITAL ORGANS 11/30/2008    Past Surgical History:  Procedure Laterality Date   HERNIA REPAIR  2012   [facility]    reports that he has never smoked. He has never used smokeless tobacco. He reports current alcohol use. He reports that he does not use drugs. family history includes Cancer in an other family member; Coronary artery disease in his father; Depression in his maternal grandmother and mother; Diabetes in his father; Heart attack in his father; Hypertension in his father. Allergies  Allergen Reactions   Penicillins     Childhood allergy   Current Outpatient Medications on File Prior to Visit  Medication Sig Dispense Refill   Accu-Chek FastClix Lancets MISC Use as directed 2 (two) times daily. 102 each 11   Alcohol Swabs (B-D SINGLE USE SWABS REGULAR) PADS Use as directed     aspirin 81 MG tablet Take 81 mg by mouth daily.     Blood Glucose Monitoring Suppl (FREESTYLE FREEDOM LITE) w/Device KIT Use to check blood sugars twice a day 1 kit 0   Cholecalciferol  50 MCG (2000 UT) TABS Take 1 tablet (2,000 Units total) by mouth daily. 90 tablet 3   cyanocobalamin  (VITAMIN B12) 1000 MCG tablet Take 1 tablet (1,000 mcg total) by mouth daily. 90 tablet 3   diclofenac  (VOLTAREN ) 75 MG EC tablet Take 1 tablet (75 mg total) by mouth 2 (  two) times daily as needed. 60 tablet 5   gabapentin  (NEURONTIN ) 300 MG capsule 1-2 tab by mouth at bedtime 90 capsule 3   glipiZIDE  (GLIPIZIDE  XL) 5 MG 24 hr tablet Take 1 tablet (5 mg total) by mouth daily with breakfast. 90 tablet 3   glucose blood (FREESTYLE LITE) test strip Use to check blood sugar 2 (two) times daily. 150 each 3   Lancet Device MISC Use as directed twice per day E11.9 200 each 11   metFORMIN  (GLUCOPHAGE -XR) 500 MG 24 hr tablet Take 4 tablets (2,000 mg total) by mouth daily with breakfast. 360 tablet 3   nitroGLYCERIN  (NITRODUR - DOSED IN MG/24 HR) 0.2 mg/hr patch 1/4 patch daily 30 patch 1   NON FORMULARY Reported on 10/02/2015     sitaGLIPtin  (JANUVIA ) 100 MG tablet  Take 1 tablet (100 mg total) by mouth daily. 90 tablet 3   tirzepatide  (MOUNJARO ) 5 MG/0.5ML Pen Inject 5 mg into the skin once a week. (Patient not taking: Reported on 09/29/2023) 6 mL 3   No current facility-administered medications on file prior to visit.        ROS:  All others reviewed and negative.  Objective        PE:  BP 120/78 (BP Location: Right Arm, Patient Position: Sitting, Cuff Size: Normal)   Pulse 80   Temp 98.8 F (37.1 C) (Oral)   Ht 5\' 11"  (1.803 m)   Wt 204 lb (92.5 kg)   SpO2 98%   BMI 28.45 kg/m                 Constitutional: Pt appears in NAD               HENT: Head: NCAT.                Right Ear: External ear normal.                 Left Ear: External ear normal.                Eyes: . Pupils are equal, round, and reactive to light. Conjunctivae and EOM are normal               Nose: without d/c or deformity               Neck: Neck supple. Gross normal ROM               Cardiovascular: Normal rate and regular rhythm.                 Pulmonary/Chest: Effort normal and breath sounds without rales or wheezing.                Abd:  Soft, NT, ND, + BS, no organomegaly               Neurological: Pt is alert. At baseline orientation, motor grossly intact               Skin: Skin is warm. No rashes, no other new lesions, LE edema - none               Psychiatric: Pt behavior is normal without agitation   Micro: none  Cardiac tracings I have personally interpreted today:  none  Pertinent Radiological findings (summarize): none   Lab Results  Component Value Date   WBC 8.1 09/23/2023   HGB 16.2 09/23/2023   HCT 48.3 09/23/2023   PLT 215.0 09/23/2023  GLUCOSE 150 (H) 09/23/2023   CHOL 129 09/23/2023   TRIG 223.0 (H) 09/23/2023   HDL 38.30 (L) 09/23/2023   LDLDIRECT 70.0 03/05/2017   LDLCALC 46 09/23/2023   ALT 25 09/23/2023   AST 18 09/23/2023   NA 140 09/23/2023   K 4.3 09/23/2023   CL 102 09/23/2023   CREATININE 1.26 09/23/2023   BUN 16  09/23/2023   CO2 28 09/23/2023   TSH 2.53 09/23/2023   PSA 0.69 09/23/2023   HGBA1C 8.3 (H) 09/23/2023   MICROALBUR <0.7 09/23/2023   Assessment/Plan:  [name] is a 53 y.o. White or Caucasian [1] male with  has a past medical history of ALLERGIC RHINITIS (12/31/2008), DIABETES MELLITUS, TYPE II (06/03/2009), Dyslipidemia (10/31/2014), FOOT PAIN, RIGHT (08/08/2010), HYPERSOMNIA (11/30/2008), Lumbar disc disease (03/01/2011), OBSTRUCTIVE SLEEP APNEA (02/03/2009), and OTHER INFLAMMATORY DISORDER MALE GENITAL ORGANS (11/30/2008).  Encounter for well adult exam with abnormal findings Age and sex appropriate education and counseling updated with regular exercise and diet Referrals for preventative services - none needed Immunizations addressed - for tdap and shignrix at pharmacy, for prevnar 20 today Smoking counseling  - none needed Evidence for depression or other mood disorder - none significant Most recent labs reviewed. I have personally reviewed and have noted: 1) the patient's medical and social history 2) The patient's current medications and supplements 3) The patient's height, weight, and BMI have been recorded in the chart   Diabetes Lab Results  Component Value Date   HGBA1C 8.3 (H) 09/23/2023   uncontrolled, pt to continue current medical treatment glipizide  Xl 5 every day, metfomiin 500 mg - 4 every day, januvia  100 mg every day, and increased ozempic  1 mg weekly   B12 deficiency Lab Results  Component Value Date   VITAMINB12 232 09/23/2023   Low, to add oral replacement - b12 in multivitamin  Vitamin D  deficiency Last vitamin D  Lab Results  Component Value Date   VD25OH 21.38 (L) 09/23/2023   Low, to start oral replacement  Followup: Return in about 6 months (around 03/31/2024).  Rosalia Colonel, MD 09/30/2023 7:47 PM [facility] - [facility]

## 2023-09-29 NOTE — Patient Instructions (Addendum)
 You had the Prevnar 20 pneumonia shot today  Please have your Shingrix (shingles) shots done at your local pharmacy., and the Tdap tetanus  Please let us  know if you would want the Cardiac CT score testing to check for heart artery blockages  Ok to increase the ozempic  to 1 mg weekly  Please take a mutlivitamin with B12, and Please take OTC Vitamin D3 at 2000 units per day, indefinitely  Please continue all other medications as before, and refills have been done if requested.  Please have the pharmacy call with any other refills you may need.  Please continue your efforts at being more active, low cholesterol diet, and weight control.  You are otherwise up to date with prevention measures today.  Please keep your appointments with your specialists as you may have planned - Dermatology for the left neck skin cancer  Please make an Appointment to return in 6 months, or sooner if needed, also with Lab Appointment for testing done 3-5 days before at the FIRST FLOOR Lab (so this is for TWO appointments - please see the scheduling desk as you leave)

## 2023-09-30 ENCOUNTER — Encounter: Payer: Self-pay | Admitting: Internal Medicine

## 2023-09-30 NOTE — Assessment & Plan Note (Signed)
 Age and sex appropriate education and counseling updated with regular exercise and diet Referrals for preventative services - none needed Immunizations addressed - for tdap and shignrix at pharmacy, for prevnar 20 today Smoking counseling  - none needed Evidence for depression or other mood disorder - none significant Most recent labs reviewed. I have personally reviewed and have noted: 1) the patient's medical and social history 2) The patient's current medications and supplements 3) The patient's height, weight, and BMI have been recorded in the chart

## 2023-09-30 NOTE — Assessment & Plan Note (Signed)
 Last vitamin D  Lab Results  Component Value Date   VD25OH 21.38 (L) 09/23/2023   Low, to start oral replacement

## 2023-09-30 NOTE — Assessment & Plan Note (Signed)
 Lab Results  Component Value Date   VITAMINB12 232 09/23/2023   Low, to add oral replacement - b12 in multivitamin

## 2023-09-30 NOTE — Assessment & Plan Note (Signed)
 Lab Results  Component Value Date   HGBA1C 8.3 (H) 09/23/2023   uncontrolled, pt to continue current medical treatment glipizide  Xl 5 every day, metfomiin 500 mg - 4 every day, januvia  100 mg every day, and increased ozempic  1 mg weekly

## 2023-10-04 ENCOUNTER — Other Ambulatory Visit: Payer: Self-pay | Admitting: Internal Medicine

## 2023-10-05 ENCOUNTER — Other Ambulatory Visit: Payer: Self-pay

## 2023-10-05 ENCOUNTER — Other Ambulatory Visit (HOSPITAL_COMMUNITY): Payer: Self-pay

## 2023-10-05 MED ORDER — SITAGLIPTIN PHOSPHATE 100 MG PO TABS
100.0000 mg | ORAL_TABLET | Freq: Every day | ORAL | 3 refills | Status: DC
  Filled 2023-10-05: qty 90, 90d supply, fill #0
  Filled 2023-12-29: qty 90, 90d supply, fill #1
  Filled 2024-04-02: qty 30, 30d supply, fill #2

## 2023-10-06 ENCOUNTER — Encounter: Payer: Self-pay | Admitting: Dermatology

## 2023-10-06 ENCOUNTER — Other Ambulatory Visit (HOSPITAL_COMMUNITY): Payer: Self-pay

## 2023-10-06 ENCOUNTER — Ambulatory Visit: Admitting: Dermatology

## 2023-10-06 VITALS — BP 132/93 | HR 84

## 2023-10-06 DIAGNOSIS — D044 Carcinoma in situ of skin of scalp and neck: Secondary | ICD-10-CM | POA: Diagnosis not present

## 2023-10-06 DIAGNOSIS — D099 Carcinoma in situ, unspecified: Secondary | ICD-10-CM

## 2023-10-06 NOTE — Progress Notes (Signed)
   Follow-Up Visit   Subjective  [name] is a 53 y.o. male who presents for the following: ED&C  Kesean Hinckle presents for follow-up after recent biopsies, with one revealing superficial squamous cell carcinoma in situ on his neck. The patient also had a biopsy on his forearm, which showed an irritated mole.  The squamous cell carcinoma in situ is located on the patient's neck and measures 6mm by 6mm. It is confined to the top layer of skin. The patient is scheduled for an electrodesiccation and curettage [facility]) procedure to remove the cancerous cells.    The following portions of the chart were reviewed this encounter and updated as appropriate: medications, allergies, medical history  Review of Systems:  No other skin or systemic complaints except as noted in HPI or Assessment and Plan.  Objective  Well appearing patient in no apparent distress; mood and affect are within normal limits.   A focused examination was performed of the following areas:   Relevant exam findings are noted in the Assessment and Plan.  Left Neck Healing biopsy site  Assessment & Plan   1. Squamous cell carcinoma in situ (neck) - Assessment: Biopsy-confirmed superficial squamous cell carcinoma in situ on the neck, measuring 6mm by 6mm. Limited to the top layer of skin, indicating an early stage of the disease.   - Plan:    Perform electrodesiccation and curettage [facility]) procedure with 3mm margins     - Informed consent obtained: 87% cure rate discussed     - Local anesthesia to be administered     - Three cycles of curettage and electrodesiccation to be performed    Post-procedure care instructions:     - Keep the site clean with soap and water     - Apply Vaseline and cover with a Band-Aid    Schedule follow-up skin check in 6 months    If clear at 18-month follow-up, transition to yearly skin checks    I, Zan Hess, CMA, am acting as scribe for [employer], DO.   Documentation: I  have reviewed the above documentation for accuracy and completeness, and I agree with the above.  Louana Roup, DO

## 2023-10-06 NOTE — Patient Instructions (Addendum)
 Hello [name],  Thank you for visiting today. Here is a summary of the key instructions:  - Procedure: ED&C (electrodessication and curettage) was done to remove the superficial squamous cell skin cancer on your neck   - The area will be numbed before the procedure   - The procedure involves scraping out cancer cells and burning the base three times  - Wound Care:   - Keep the biopsy site on your forearm clean   - Apply Aquaphor and a Band-Aid daily for 1-2 weeks   - After [facility], keep the site clean with soap and water   - Apply Vaseline and a Band-Aid to the treatment site  - Sun Protection:   - Continue using sunscreen regularly   - Use the provided Neutrogena Ultra Sheer mineral sunscreen samples  - Follow-up:   - Return in 6 months for your next skin check   - If all is clear, future checks will be yearly  We look forward to seeing you at your next visit. If you have any questions or concerns before then, please do not hesitate to contact our office.  Warm regards,  Dr. Louana Roup Dermatology     Important Information  Due to recent changes in healthcare laws, you may see results of your pathology and/or laboratory studies on MyChart before the doctors have had a chance to review them. We understand that in some cases there may be results that are confusing or concerning to you. Please understand that not all results are received at the same time and often the doctors may need to interpret multiple results in order to provide you with the best plan of care or course of treatment. Therefore, we ask that you please give us  2 business days to thoroughly review all your results before contacting the office for clarification. Should we see a critical lab result, you will be contacted sooner.   If You Need Anything After Your Visit  If you have any questions or concerns for your doctor, please call our main line at [phone] If no one answers, please leave a voicemail  as directed and we will return your call as soon as possible. Messages left after 4 pm will be answered the following business day.   You may also send us  a message via MyChart. We typically respond to MyChart messages within 1-2 business days.  For prescription refills, please ask your pharmacy to contact our office. Our fax number is [phone].  If you have an urgent issue when the clinic is closed that cannot wait until the next business day, you can page your doctor at the number below.    Please note that while we do our best to be available for urgent issues outside of office hours, we are not available 24/7.   If you have an urgent issue and are unable to reach us , you may choose to seek medical care at your doctor's office, retail clinic, urgent care center, or emergency room.  If you have a medical emergency, please immediately call 911 or go to the emergency department. In the event of inclement weather, please call our main line at [phone] for an update on the status of any delays or closures.  Dermatology Medication Tips: Please keep the boxes that topical medications come in in order to help keep track of the instructions about where and how to use these. Pharmacies typically print the medication instructions only on the boxes and not directly on the medication tubes.  If your medication is too expensive, please contact our office at [phone] or send us  a message through MyChart.   We are unable to tell what your co-pay for medications will be in advance as this is different depending on your insurance coverage. However, we may be able to find a substitute medication at lower cost or fill out paperwork to get insurance to cover a needed medication.   If a prior authorization is required to get your medication covered by your insurance company, please allow us  1-2 business days to complete this process.  Drug prices often vary depending on where the prescription is  filled and some pharmacies may offer cheaper prices.  The website www.goodrx.com contains coupons for medications through different pharmacies. The prices here do not account for what the cost may be with help from insurance (it may be cheaper with your insurance), but the website can give you the price if you did not use any insurance.  - You can print the associated coupon and take it with your prescription to the pharmacy.  - You may also stop by our office during regular business hours and pick up a GoodRx coupon card.  - If you need your prescription sent electronically to a different pharmacy, notify our office through [facility] or by phone at [phone]

## 2023-10-26 ENCOUNTER — Other Ambulatory Visit: Payer: Self-pay | Admitting: Internal Medicine

## 2023-10-26 ENCOUNTER — Other Ambulatory Visit (HOSPITAL_COMMUNITY): Payer: Self-pay

## 2023-10-26 ENCOUNTER — Other Ambulatory Visit: Payer: Self-pay

## 2023-10-26 MED ORDER — METFORMIN HCL ER 500 MG PO TB24
2000.0000 mg | ORAL_TABLET | Freq: Every day | ORAL | 3 refills | Status: DC
  Filled 2023-10-26: qty 360, 90d supply, fill #0
  Filled 2024-02-02: qty 360, 90d supply, fill #1

## 2023-11-29 ENCOUNTER — Other Ambulatory Visit: Payer: Self-pay | Admitting: Internal Medicine

## 2023-11-29 MED ORDER — SEMAGLUTIDE (1 MG/DOSE) 4 MG/3ML ~~LOC~~ SOPN: 1.0000 mg | PEN_INJECTOR | SUBCUTANEOUS | 3 refills | Status: DC

## 2023-11-29 NOTE — Telephone Encounter (Signed)
 Copied from CRM [id number]. Topic: Clinical - Medication Refill >> Nov 29, 2023  8:40 AM Berwyn MATSU wrote: Medication: Semaglutide , 1 MG/DOSE, 4 MG/3ML SOPN  Has the patient contacted their pharmacy? Yes (Agent: If no, request that the patient contact the pharmacy for the refill. If patient does not wish to contact the pharmacy document the reason why and proceed with request.) (Agent: If yes, when and what did the pharmacy advise?)  This is the patient's preferred pharmacy:   [phone] [place] - [address] [place] [address] Phone: [phone] Fax: [phone]  Is this the correct pharmacy for this prescription? Yes If no, delete pharmacy and type the correct one.   Has the prescription been filled recently? Yes  Is the patient out of the medication? Yes  Has the patient been seen for an appointment in the last year OR does the patient have an upcoming appointment? Yes  Can we respond through MyChart? Yes  Agent: Please be advised that Rx refills may take up to 3 business days. We ask that you follow-up with your pharmacy.

## 2023-12-30 ENCOUNTER — Other Ambulatory Visit (HOSPITAL_COMMUNITY): Payer: Self-pay

## 2023-12-30 ENCOUNTER — Other Ambulatory Visit: Payer: Self-pay

## 2024-02-03 ENCOUNTER — Other Ambulatory Visit (HOSPITAL_COMMUNITY): Payer: Self-pay

## 2024-03-29 ENCOUNTER — Other Ambulatory Visit (INDEPENDENT_AMBULATORY_CARE_PROVIDER_SITE_OTHER)

## 2024-03-29 ENCOUNTER — Telehealth: Payer: Self-pay

## 2024-03-29 ENCOUNTER — Other Ambulatory Visit: Payer: Self-pay | Admitting: Internal Medicine

## 2024-03-29 DIAGNOSIS — E538 Deficiency of other specified B group vitamins: Secondary | ICD-10-CM

## 2024-03-29 DIAGNOSIS — E1165 Type 2 diabetes mellitus with hyperglycemia: Secondary | ICD-10-CM | POA: Diagnosis not present

## 2024-03-29 DIAGNOSIS — E559 Vitamin D deficiency, unspecified: Secondary | ICD-10-CM

## 2024-03-29 NOTE — Addendum Note (Signed)
 Addended by: MARYLEN PRO A on: 03/29/2024 04:15 PM   Modules accepted: Orders

## 2024-03-29 NOTE — Telephone Encounter (Signed)
 Copied from CRM #[id number]. Topic: Clinical - Medication Refill >> Mar 29, 2024 11:11 AM Viola F wrote: Medication: metFORMIN  (GLUCOPHAGE -XR) 500 MG 24 hr tablet [[id number]] sitaGLIPtin  (JANUVIA ) 100 MG tablet [[id number]] OZEMPIC , 1 MG/DOSE, 4 MG/3ML SOPN [Pharmacy Med Name: OZEMPIC  4 MG/3 ML (1 MG/DOSE)] [[id number]]  Has the patient contacted their pharmacy? Yes (Agent: If no, request that the patient contact the pharmacy for the refill. If patient does not wish to contact the pharmacy document the reason why and proceed with request.) (Agent: If yes, when and what did the pharmacy advise?)  This is the patient's preferred pharmacy:  [phone] [facility], [place] - [address] [place] [address] Phone: [phone] Fax: [phone]  Is this the correct pharmacy for this prescription? Yes If no, delete pharmacy and type the correct one.   Has the prescription been filled recently? Yes  Is the patient out of the medication? No  Has the patient been seen for an appointment in the last year OR does the patient have an upcoming appointment? Yes  Can we respond through MyChart? Yes  Agent: Please be advised that Rx refills may take up to 3 business days. We ask that you follow-up with your pharmacy.

## 2024-03-29 NOTE — Telephone Encounter (Signed)
 Copied from CRM [id number]. Topic: General - Running Late >> Mar 29, 2024  3:54 PM Sasha M wrote: Patient/patient representative is calling because they are running late for an appointment.  Pt gps says he will arrive about 4  I spoke with Gavin Childes in our lab to relay message from patient.

## 2024-03-31 ENCOUNTER — Ambulatory Visit: Admitting: Internal Medicine

## 2024-03-31 ENCOUNTER — Encounter: Payer: Self-pay | Admitting: Internal Medicine

## 2024-03-31 VITALS — BP 122/76 | HR 70 | Temp 98.4°F | Ht 71.0 in | Wt 199.0 lb

## 2024-03-31 DIAGNOSIS — E559 Vitamin D deficiency, unspecified: Secondary | ICD-10-CM | POA: Diagnosis not present

## 2024-03-31 DIAGNOSIS — Z23 Encounter for immunization: Secondary | ICD-10-CM

## 2024-03-31 DIAGNOSIS — E785 Hyperlipidemia, unspecified: Secondary | ICD-10-CM

## 2024-03-31 DIAGNOSIS — Z125 Encounter for screening for malignant neoplasm of prostate: Secondary | ICD-10-CM

## 2024-03-31 DIAGNOSIS — E1165 Type 2 diabetes mellitus with hyperglycemia: Secondary | ICD-10-CM

## 2024-03-31 DIAGNOSIS — Z7984 Long term (current) use of oral hypoglycemic drugs: Secondary | ICD-10-CM

## 2024-03-31 DIAGNOSIS — E538 Deficiency of other specified B group vitamins: Secondary | ICD-10-CM

## 2024-03-31 DIAGNOSIS — Z1211 Encounter for screening for malignant neoplasm of colon: Secondary | ICD-10-CM

## 2024-03-31 NOTE — Progress Notes (Signed)
 Patient ID: [name], male   DOB: 1970-06-10, 53 y.o.   MRN: [id number]        Chief Complaint: follow up low vit d, hld, DM with hyperglycemia, low b12       HPI:  [name] is a 53 y.o. male here overall doing well overall, conts to lose wt with GLP1 but notes no low sugars recently on OHA.  Pt denies chest pain, increased sob or doe, wheezing, orthopnea, PND, increased LE swelling, palpitations, dizziness or syncope.   Pt denies polydipsia, polyuria, or new focal neuro s/s.    Pt denies fever, wt loss, night sweats, loss of appetite, or other constitutional symptoms  Working 10 hrs per day, very hard to find time to be more active.  Due for flu shot today.  Pt also accepts cologuard referral.         Wt Readings from Last 3 Encounters:  03/31/24 199 lb (90.3 kg)  09/29/23 204 lb (92.5 kg)  04/01/23 217 lb (98.4 kg)   BP Readings from Last 3 Encounters:  03/31/24 122/76  10/06/23 (!) 132/93  09/29/23 120/78         Past Medical History:  Diagnosis Date   ALLERGIC RHINITIS 12/31/2008   DIABETES MELLITUS, TYPE II 06/03/2009   Dyslipidemia 10/31/2014   FOOT PAIN, RIGHT 08/08/2010   HYPERSOMNIA 11/30/2008   Lumbar disc disease 03/01/2011   OBSTRUCTIVE SLEEP APNEA 02/03/2009   OTHER INFLAMMATORY DISORDER MALE GENITAL ORGANS 11/30/2008   Past Surgical History:  Procedure Laterality Date   HERNIA REPAIR  2012   [facility]    reports that he has never smoked. He has never used smokeless tobacco. He reports current alcohol use. He reports that he does not use drugs. family history includes Cancer in an other family member; Coronary artery disease in his father; Depression in his maternal grandmother and mother; Diabetes in his father; Heart attack in his father; Hypertension in his father. Allergies  Allergen Reactions   Penicillins     Childhood allergy   Current Outpatient Medications on File Prior to Visit  Medication Sig Dispense Refill   Accu-Chek FastClix Lancets MISC  Use as directed 2 (two) times daily. 102 each 11   Alcohol Swabs (B-D SINGLE USE SWABS REGULAR) PADS Use as directed     aspirin 81 MG tablet Take 81 mg by mouth daily.     Blood Glucose Monitoring Suppl (FREESTYLE FREEDOM LITE) w/Device KIT Use to check blood sugars twice a day 1 kit 0   Cholecalciferol  50 MCG (2000 UT) TABS Take 1 tablet (2,000 Units total) by mouth daily. 90 tablet 3   cyanocobalamin  (VITAMIN B12) 1000 MCG tablet Take 1 tablet (1,000 mcg total) by mouth daily. 90 tablet 3   diclofenac  (VOLTAREN ) 75 MG EC tablet Take 1 tablet (75 mg total) by mouth 2 (two) times daily as needed. 60 tablet 5   gabapentin  (NEURONTIN ) 300 MG capsule 1-2 tab by mouth at bedtime 90 capsule 3   glucose blood (FREESTYLE LITE) test strip Use to check blood sugar 2 (two) times daily. 150 each 3   Lancet Device MISC Use as directed twice per day E11.9 200 each 11   metFORMIN  (GLUCOPHAGE -XR) 500 MG 24 hr tablet Take 4 tablets (2,000 mg total) by mouth daily with breakfast. 360 tablet 3   nitroGLYCERIN  (NITRODUR - DOSED IN MG/24 HR) 0.2 mg/hr patch 1/4 patch daily 30 patch 1   Semaglutide , 1 MG/DOSE, (OZEMPIC , 1 MG/DOSE,) 4 MG/3ML  SOPN INJECT 1 MG ONCE A WEEK AS DIRECTED 3 mL 3   sitaGLIPtin  (JANUVIA ) 100 MG tablet Take 1 tablet (100 mg total) by mouth daily. 90 tablet 3   No current facility-administered medications on file prior to visit.        ROS:  All others reviewed and negative.  Objective        PE:  BP 122/76 (BP Location: Right Arm, Patient Position: Sitting, Cuff Size: Normal)   Pulse 70   Temp 98.4 F (36.9 C) (Oral)   Ht 5' 11 (1.803 m)   Wt 199 lb (90.3 kg)   SpO2 99%   BMI 27.75 kg/m                 Constitutional: Pt appears in NAD               HENT: Head: NCAT.                Right Ear: External ear normal.                 Left Ear: External ear normal.                Eyes: . Pupils are equal, round, and reactive to light. Conjunctivae and EOM are normal                Nose: without d/c or deformity               Neck: Neck supple. Gross normal ROM               Cardiovascular: Normal rate and regular rhythm.                 Pulmonary/Chest: Effort normal and breath sounds without rales or wheezing.                Abd:  Soft, NT, ND, + BS, no organomegaly               Neurological: Pt is alert. At baseline orientation, motor grossly intact               Skin: Skin is warm. No rashes, no other new lesions, LE edema - none               Psychiatric: Pt behavior is normal without agitation   Micro: none  Cardiac tracings I have personally interpreted today:  none  Pertinent Radiological findings (summarize): none   Lab Results  Component Value Date   WBC 8.1 09/23/2023   HGB 16.2 09/23/2023   HCT 48.3 09/23/2023   PLT 215.0 09/23/2023   GLUCOSE 216 (H) 03/29/2024   CHOL 117 03/29/2024   TRIG 186 (H) 03/29/2024   HDL 43 03/29/2024   LDLDIRECT 70.0 03/05/2017   LDLCALC 44 03/29/2024   ALT WILL FOLLOW 03/29/2024   AST 17 03/29/2024   NA 140 03/29/2024   K 3.9 03/29/2024   CL 101 03/29/2024   CREATININE 1.12 03/29/2024   BUN 15 03/29/2024   CO2 22 03/29/2024   TSH 2.53 09/23/2023   PSA 0.69 09/23/2023   HGBA1C 6.9 (H) 03/29/2024   MICROALBUR <0.7 09/23/2023   Assessment/Plan:  [name] is a 53 y.o. White or Caucasian [1] male with  has a past medical history of ALLERGIC RHINITIS (12/31/2008), DIABETES MELLITUS, TYPE II (06/03/2009), Dyslipidemia (10/31/2014), FOOT PAIN, RIGHT (08/08/2010), HYPERSOMNIA (11/30/2008), Lumbar disc disease (03/01/2011), OBSTRUCTIVE SLEEP APNEA (02/03/2009), and OTHER INFLAMMATORY DISORDER  MALE GENITAL ORGANS (11/30/2008).  Diabetes (HCC) With hyperglycemia  Lab Results  Component Value Date   HGBA1C 6.9 (H) 03/29/2024   Stable, pt to continue current medical treatment metformin  ER 500 mg - 4 every day, ozempic  1 mg weekly    Vitamin D  deficiency Last vitamin D  Lab Results  Component Value Date    VD25OH 26.5 (L) 03/29/2024   Low, to start oral replacement   Dyslipidemia Lab Results  Component Value Date   LDLCALC 44 03/29/2024   Stable, pt to continue current wt control and low chol diet   B12 deficiency Lab Results  Component Value Date   VITAMINB12 255 03/29/2024   Low, to start oral replacement - b12 1000 mcg qd  Followup: Return in about 6 months (around 09/28/2024).  Lynwood Rush, MD 03/31/2024 1:09 PM [facility] Medicine

## 2024-03-31 NOTE — Assessment & Plan Note (Signed)
 Lab Results  Component Value Date   VITAMINB12 255 03/29/2024   Low, to start oral replacement - b12 1000 mcg qd

## 2024-03-31 NOTE — Assessment & Plan Note (Addendum)
 With hyperglycemia  Lab Results  Component Value Date   HGBA1C 6.9 (H) 03/29/2024   Stable, pt to continue current medical treatment metformin  ER 500 mg - 4 every day, ozempic  1 mg weekly

## 2024-03-31 NOTE — Assessment & Plan Note (Signed)
 Last vitamin D  Lab Results  Component Value Date   VD25OH 26.5 (L) 03/29/2024   Low, to start oral replacement

## 2024-03-31 NOTE — Assessment & Plan Note (Signed)
 Lab Results  Component Value Date   LDLCALC 44 03/29/2024   Stable, pt to continue current wt control and low chol diet

## 2024-03-31 NOTE — Patient Instructions (Signed)
 You had the flu shot today  Please continue all other medications as before, and refills have been done if requested.  Please have the pharmacy call with any other refills you may need.  Please continue your efforts at being more active, low cholesterol diet, and weight control.  Please keep your appointments with your specialists as you may have planned  You will be contacted regarding the referral for: cologuard  Please make an Appointment to return in 6 months, or sooner if needed, also with Lab Appointment for testing done 3-5 days before at the FIRST FLOOR Lab (so this is for TWO appointments - please see the scheduling desk as you leave)

## 2024-04-01 ENCOUNTER — Ambulatory Visit: Payer: Self-pay | Admitting: Internal Medicine

## 2024-04-01 LAB — BASIC METABOLIC PANEL WITH GFR
BUN/Creatinine Ratio: 13 (ref 9–20)
BUN: 15 mg/dL (ref 6–24)
CO2: 22 mmol/L (ref 20–29)
Calcium: 9.4 mg/dL (ref 8.7–10.2)
Chloride: 101 mmol/L (ref 96–106)
Creatinine, Ser: 1.12 mg/dL (ref 0.76–1.27)
Glucose: 216 mg/dL — ABNORMAL HIGH (ref 70–99)
Potassium: 3.9 mmol/L (ref 3.5–5.2)
Sodium: 140 mmol/L (ref 134–144)
eGFR: 79 mL/min/1.73 (ref >59)

## 2024-04-01 LAB — HEPATIC FUNCTION PANEL
ALT: 15 IU/L (ref 0–44)
AST: 17 IU/L (ref 0–40)
Albumin: 4.5 g/dL (ref 3.8–4.9)
Alkaline Phosphatase: 86 IU/L (ref 47–123)
Bilirubin Total: 0.6 mg/dL (ref 0.0–1.2)
Bilirubin, Direct: 0.25 mg/dL (ref 0.00–0.40)
Total Protein: 6.4 g/dL (ref 6.0–8.5)

## 2024-04-01 LAB — LIPID PANEL
Chol/HDL Ratio: 2.7 ratio (ref 0.0–5.0)
Cholesterol, Total: 117 mg/dL (ref 100–199)
HDL: 43 mg/dL (ref >39)
LDL Chol Calc (NIH): 44 mg/dL (ref 0–99)
Triglycerides: 186 mg/dL — ABNORMAL HIGH (ref 0–149)
VLDL Cholesterol Cal: 30 mg/dL (ref 5–40)

## 2024-04-01 LAB — HEMOGLOBIN A1C
Est. average glucose Bld gHb Est-mCnc: 151 mg/dL
Hgb A1c MFr Bld: 6.9 % — ABNORMAL HIGH (ref 4.8–5.6)

## 2024-04-01 LAB — VITAMIN D 25 HYDROXY (VIT D DEFICIENCY, FRACTURES): Vit D, 25-Hydroxy: 26.5 ng/mL — ABNORMAL LOW (ref 30.0–100.0)

## 2024-04-01 LAB — VITAMIN B12: Vitamin B-12: 255 pg/mL (ref 232–1245)

## 2024-04-02 ENCOUNTER — Other Ambulatory Visit (HOSPITAL_COMMUNITY): Payer: Self-pay

## 2024-04-03 ENCOUNTER — Other Ambulatory Visit: Payer: Self-pay

## 2024-04-03 ENCOUNTER — Other Ambulatory Visit (HOSPITAL_COMMUNITY): Payer: Self-pay

## 2024-04-03 MED ORDER — METFORMIN HCL ER 500 MG PO TB24: 2000.0000 mg | ORAL_TABLET | Freq: Every day | ORAL | 3 refills | Status: AC

## 2024-04-03 MED ORDER — SITAGLIPTIN PHOSPHATE 100 MG PO TABS: 100.0000 mg | ORAL_TABLET | Freq: Every day | ORAL | 3 refills | Status: AC

## 2024-04-10 ENCOUNTER — Ambulatory Visit: Admitting: Dermatology

## 2024-04-10 ENCOUNTER — Encounter: Payer: Self-pay | Admitting: Dermatology

## 2024-04-10 VITALS — BP 119/85 | HR 76

## 2024-04-10 DIAGNOSIS — L570 Actinic keratosis: Secondary | ICD-10-CM

## 2024-04-10 DIAGNOSIS — D1801 Hemangioma of skin and subcutaneous tissue: Secondary | ICD-10-CM | POA: Diagnosis not present

## 2024-04-10 DIAGNOSIS — Z1283 Encounter for screening for malignant neoplasm of skin: Secondary | ICD-10-CM

## 2024-04-10 DIAGNOSIS — L814 Other melanin hyperpigmentation: Secondary | ICD-10-CM

## 2024-04-10 DIAGNOSIS — D229 Melanocytic nevi, unspecified: Secondary | ICD-10-CM

## 2024-04-10 DIAGNOSIS — D489 Neoplasm of uncertain behavior, unspecified: Secondary | ICD-10-CM | POA: Diagnosis not present

## 2024-04-10 DIAGNOSIS — L578 Other skin changes due to chronic exposure to nonionizing radiation: Secondary | ICD-10-CM | POA: Diagnosis not present

## 2024-04-10 DIAGNOSIS — W908XXA Exposure to other nonionizing radiation, initial encounter: Secondary | ICD-10-CM

## 2024-04-10 DIAGNOSIS — D2362 Other benign neoplasm of skin of left upper limb, including shoulder: Secondary | ICD-10-CM

## 2024-04-10 DIAGNOSIS — L821 Other seborrheic keratosis: Secondary | ICD-10-CM

## 2024-04-10 NOTE — Progress Notes (Signed)
 Total Body Skin Exam (TBSE) Visit  Patient (and/or pt guardian) consented to the use of AI-assisted tools for note generation.    Subjective  [name] is a 53 y.o. male who presents for the following: Skin Cancer Screening and Full Body Skin Exam  Patient presents today for follow up visit for TBSE.  Patient was last evaluated on 10/06/23 .  Patient denies medication changes.  Patient reports he  does not have spots, moles and lesions of concern to be evaluated. Patient reports throughout his lifetime he  has had moderate sun exposure.  Currently, patient reports if he  has excessive sun exposure, he  does apply sunscreen and/or wears protective coverings.  Patient reports he has hx of bx with diagnosis of SCCIS on neck, treated on 10/06/23 Patient admits to family history of skin cancers - grandfather unsure of diagnosis The patient has spots, moles and lesions to be evaluated, some may be new or changing and the patient has concerns that these could be cancer.  The following portions of the chart were reviewed this encounter and updated as appropriate: medications, allergies, medical history  Review of Systems:  No other skin or systemic complaints except as noted in HPI or Assessment and Plan.  Objective  Well appearing patient in no apparent distress; mood and affect are within normal limits.  A full examination was performed including scalp, head, eyes, ears, nose, lips, neck, chest, axillae, abdomen, back, buttocks, bilateral upper extremities, bilateral lower extremities, hands, feet, fingers, toes, fingernails, and toenails. All findings within normal limits unless otherwise noted below.   Relevant physical exam findings are noted in the Assessment and Plan.  Left Posterior Shoulder 5 mm Pink atrophic plaque   Assessment & Plan   LENTIGINES, SEBORRHEIC KERATOSES, HEMANGIOMAS - Benign normal skin lesions - Benign-appearing - Call for any changes  MELANOCYTIC  NEVI - Tan-brown and/or pink-flesh-colored symmetric macules and papules - Benign appearing on exam today - Observation - Call clinic for new or changing moles - Recommend daily use of broad spectrum spf 30+ sunscreen to sun-exposed areas.   ACTINIC DAMAGE - Chronic condition, secondary to cumulative UV/sun exposure - diffuse scaly erythematous macules with underlying dyspigmentation - Recommend daily broad spectrum sunscreen SPF 30+ to sun-exposed areas, reapply every 2 hours as needed.  - Staying in the shade or wearing long sleeves, sun glasses (UVA+UVB protection) and wide brim hats (4-inch brim around the entire circumference of the hat) are also recommended for sun protection.  - Call for new or changing lesions.  SKIN CANCER SCREENING PERFORMED TODAY.  Pink atrophic paquye r/o morpheo BCC left posterior shoulder ACTINIC KERATOSIS (2) Left Helix, Right Helix Destruction of lesion - Left Helix, Right Helix Complexity: simple   Destruction method: cryotherapy   Informed consent: discussed and consent obtained   Timeout:  patient name, date of birth, surgical site, and procedure verified Cryotherapy cycles:  2 Outcome: patient tolerated procedure well with no complications   Post-procedure details: wound care instructions given    NEOPLASM OF UNCERTAIN BEHAVIOR Left Posterior Shoulder Epidermal / dermal shaving  Lesion diameter (cm):  0.5 Informed consent: discussed and consent obtained   Timeout: patient name, date of birth, surgical site, and procedure verified   Procedure prep:  Patient was prepped and draped in usual sterile fashion Prep type:  Isopropyl alcohol Anesthesia: the lesion was anesthetized in a standard fashion   Anesthetic:  1% lidocaine w/ epinephrine 1-100,000 local infiltration Instrument used: DermaBlade  Hemostasis achieved with: aluminum chloride   Outcome: patient tolerated procedure well   Post-procedure details: wound care instructions given     Specimen A - Surgical pathology Differential Diagnosis: r/o morpheic BCC  Check Margins: No SKIN EXAM FOR MALIGNANT NEOPLASM   MULTIPLE BENIGN MELANOCYTIC NEVI   CHERRY ANGIOMA   LENTIGINES   SEBORRHEIC KERATOSIS   ACTINIC SKIN DAMAGE   Return in 1 year (on 04/10/2025) for FBSE F/U.  I, Lyle Cords, as acting as a neurosurgeon for [employer], DO .   Documentation: I have reviewed the above documentation for accuracy and completeness, and I agree with the above.  Delon Lenis, DO

## 2024-04-10 NOTE — Patient Instructions (Addendum)

## 2024-04-11 LAB — SURGICAL PATHOLOGY

## 2024-04-12 ENCOUNTER — Ambulatory Visit: Payer: Self-pay | Admitting: Dermatology

## 2024-06-19 ENCOUNTER — Other Ambulatory Visit (HOSPITAL_COMMUNITY): Payer: Self-pay

## 2024-06-19 ENCOUNTER — Telehealth: Payer: Self-pay | Admitting: Pharmacy Technician

## 2024-06-19 NOTE — Telephone Encounter (Signed)
 Pharmacy Patient Advocate Encounter   Received notification from Onbase CMM KEY that prior authorization for Ozempic  (1 MG/DOSE) 4MG /3ML pen-injectors is required/requested.   Insurance verification completed.   The patient is insured through [employer] .   Per test claim: PA required; PA submitted to above mentioned insurance via Latent Key/confirmation #/EOC [id number] Status is pending

## 2024-06-21 ENCOUNTER — Other Ambulatory Visit (HOSPITAL_COMMUNITY): Payer: Self-pay

## 2024-06-26 ENCOUNTER — Ambulatory Visit: Payer: Self-pay | Admitting: Internal Medicine

## 2024-06-26 LAB — COLOGUARD: COLOGUARD: NEGATIVE

## 2024-06-27 ENCOUNTER — Other Ambulatory Visit (HOSPITAL_COMMUNITY): Payer: Self-pay

## 2024-06-27 NOTE — Telephone Encounter (Signed)
 Called [employer] today for status. PA shows denied in Latent however, test claim processes for $40.00. According to the representative the medication is covered and no PA is needed. Called patient's pharmacy and they advised the medication processed through the insurance on 06/04/2024 for $40.00. No further PA needed at this time.

## 2024-12-15 ENCOUNTER — Encounter: Admitting: Nurse Practitioner

## 2025-04-12 ENCOUNTER — Ambulatory Visit: Admitting: Dermatology
